# Patient Record
Sex: Male | Born: 1950 | Race: White | Hispanic: No | Marital: Married | Smoking: Never smoker
Health system: Southern US, Community
[De-identification: ages and names within clinical notes are randomized; demographics above are authoritative.]

## PROBLEM LIST (undated history)

## (undated) DIAGNOSIS — Z8739 Personal history of other diseases of the musculoskeletal system and connective tissue: Secondary | ICD-10-CM

## (undated) DIAGNOSIS — M48062 Spinal stenosis, lumbar region with neurogenic claudication: Secondary | ICD-10-CM

## (undated) DIAGNOSIS — E785 Hyperlipidemia, unspecified: Secondary | ICD-10-CM

## (undated) DIAGNOSIS — N281 Cyst of kidney, acquired: Secondary | ICD-10-CM

## (undated) DIAGNOSIS — R911 Solitary pulmonary nodule: Secondary | ICD-10-CM

## (undated) DIAGNOSIS — Z7982 Long term (current) use of aspirin: Secondary | ICD-10-CM

## (undated) DIAGNOSIS — D126 Benign neoplasm of colon, unspecified: Secondary | ICD-10-CM

## (undated) DIAGNOSIS — J45909 Unspecified asthma, uncomplicated: Secondary | ICD-10-CM

## (undated) DIAGNOSIS — M51369 Other intervertebral disc degeneration, lumbar region without mention of lumbar back pain or lower extremity pain: Secondary | ICD-10-CM

## (undated) DIAGNOSIS — M1990 Unspecified osteoarthritis, unspecified site: Secondary | ICD-10-CM

## (undated) DIAGNOSIS — E291 Testicular hypofunction: Secondary | ICD-10-CM

## (undated) DIAGNOSIS — R7303 Prediabetes: Secondary | ICD-10-CM

## (undated) DIAGNOSIS — Z8489 Family history of other specified conditions: Secondary | ICD-10-CM

## (undated) DIAGNOSIS — I10 Essential (primary) hypertension: Secondary | ICD-10-CM

## (undated) DIAGNOSIS — Z87891 Personal history of nicotine dependence: Secondary | ICD-10-CM

## (undated) DIAGNOSIS — N529 Male erectile dysfunction, unspecified: Secondary | ICD-10-CM

## (undated) DIAGNOSIS — R112 Nausea with vomiting, unspecified: Secondary | ICD-10-CM

## (undated) DIAGNOSIS — I2510 Atherosclerotic heart disease of native coronary artery without angina pectoris: Secondary | ICD-10-CM

## (undated) DIAGNOSIS — Z9841 Cataract extraction status, right eye: Secondary | ICD-10-CM

## (undated) DIAGNOSIS — L570 Actinic keratosis: Secondary | ICD-10-CM

## (undated) DIAGNOSIS — K621 Rectal polyp: Secondary | ICD-10-CM

## (undated) DIAGNOSIS — Z9889 Other specified postprocedural states: Secondary | ICD-10-CM

## (undated) DIAGNOSIS — K635 Polyp of colon: Secondary | ICD-10-CM

## (undated) HISTORY — DX: Actinic keratosis: L57.0

## (undated) HISTORY — PX: OTHER SURGICAL HISTORY: SHX169

## (undated) HISTORY — PX: EYE SURGERY: SHX253

---

## 1970-10-25 HISTORY — PX: HERNIA REPAIR: SHX51

## 1974-10-25 HISTORY — PX: ANKLE FRACTURE SURGERY: SHX122

## 1974-10-25 HISTORY — PX: FRACTURE SURGERY: SHX138

## 1982-10-25 HISTORY — PX: BACK SURGERY: SHX140

## 2005-12-17 ENCOUNTER — Ambulatory Visit: Payer: Self-pay | Admitting: Gastroenterology

## 2007-09-22 ENCOUNTER — Ambulatory Visit: Payer: Self-pay | Admitting: Internal Medicine

## 2007-10-24 ENCOUNTER — Ambulatory Visit: Payer: Self-pay | Admitting: Internal Medicine

## 2007-10-24 IMAGING — CT CT ABDOMEN W/ CM
1 of 3 series · 14 of 32 positions shown, 19 images · non-contrast
Comparison: none

REASON FOR EXAM: RUQ pain
COMMENTS:

PROCEDURE:     CT  - CT ABDOMEN STANDARD W  - [DATE] [DATE]
RESULT:
HISTORY: Hepatomegaly, RIGHT upper quadrant/RIGHT flank pain.

[Series 2: soft tissue · axial · 0.85mm/px · z∈[-938,-674]mm · 14 of 39 slices shown, 19 images]
[im 3/39  soft-tissue]
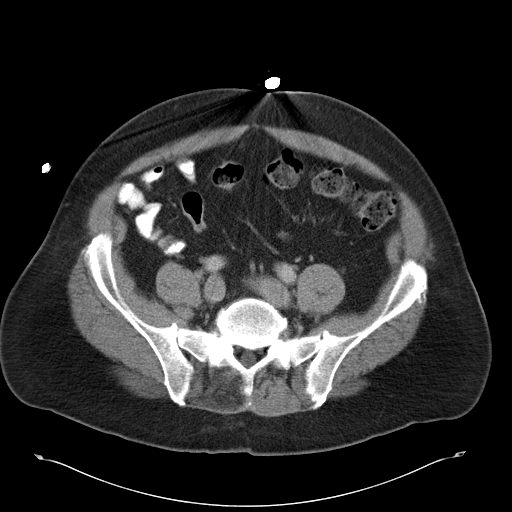
[im 3/39  bone]
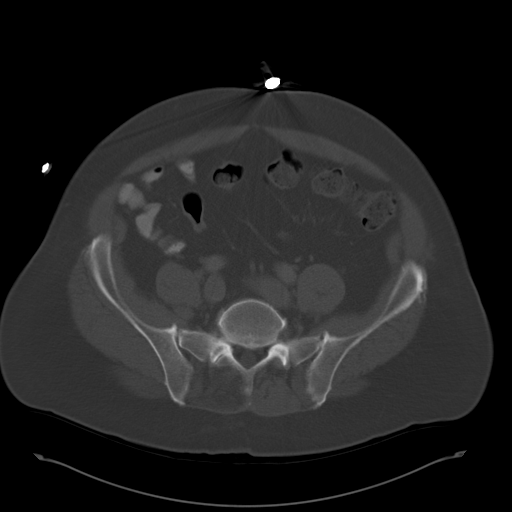
[im 5/39  soft-tissue]
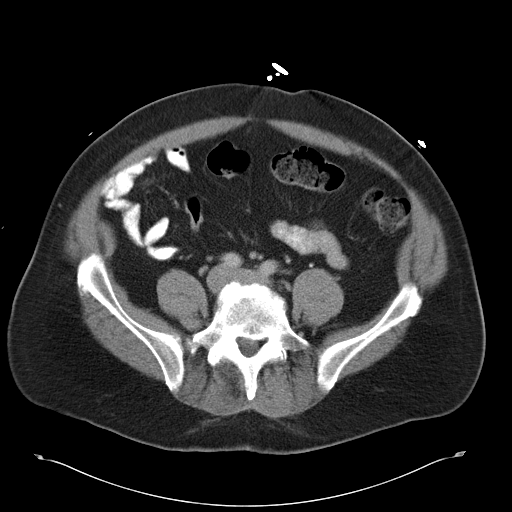
[im 8/39  soft-tissue]
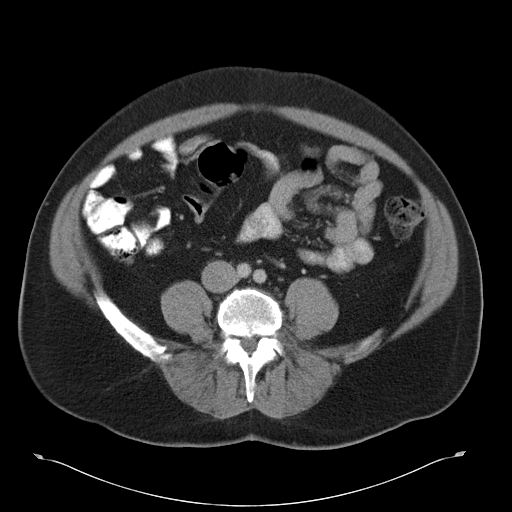
[im 12/39  soft-tissue]
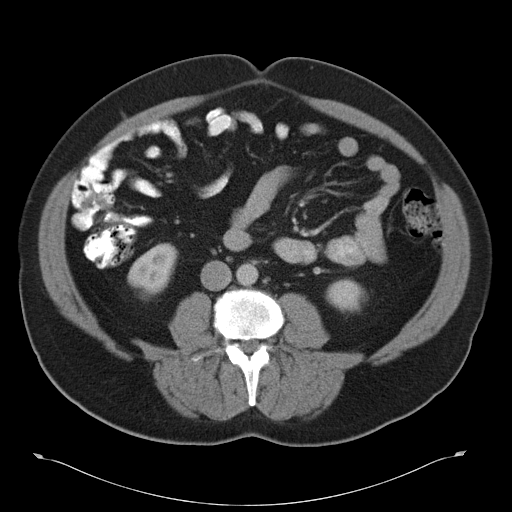
[im 15/39  soft-tissue]
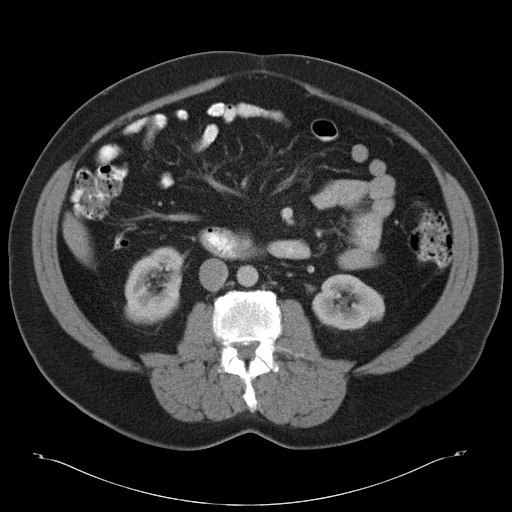
[im 17/39  soft-tissue]
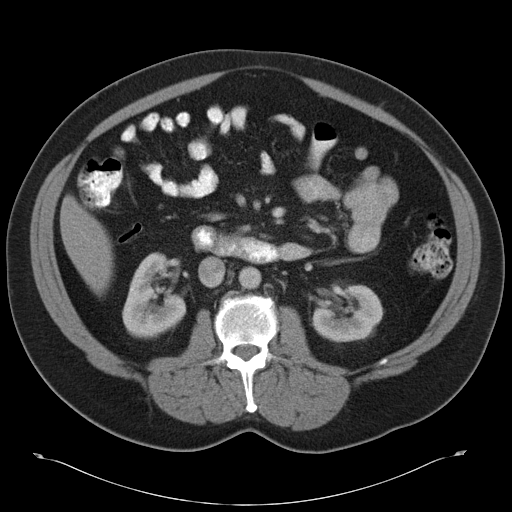
[im 20/39  soft-tissue]
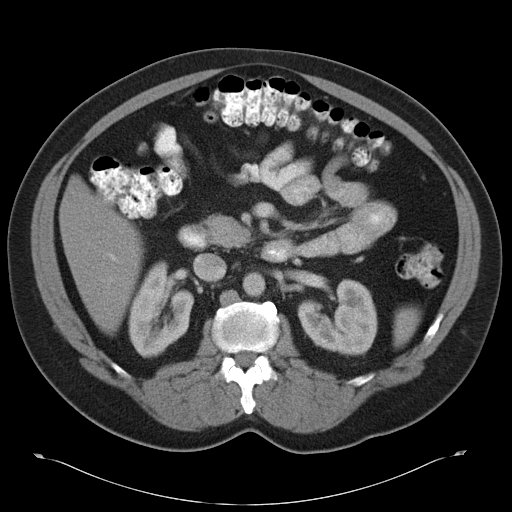
[im 22/39  soft-tissue]
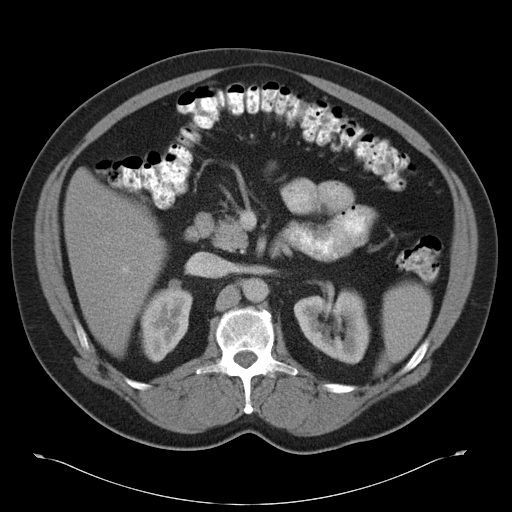
[im 24/39  soft-tissue]
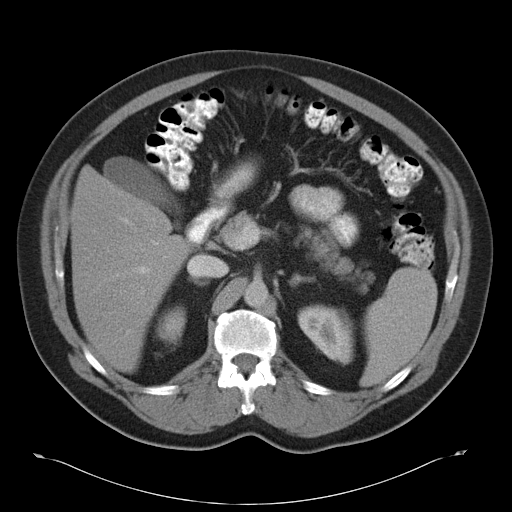
[im 24/39  bone]
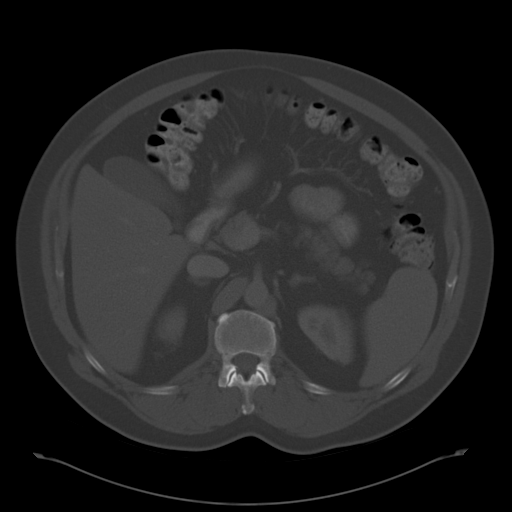
[im 27/39  soft-tissue]
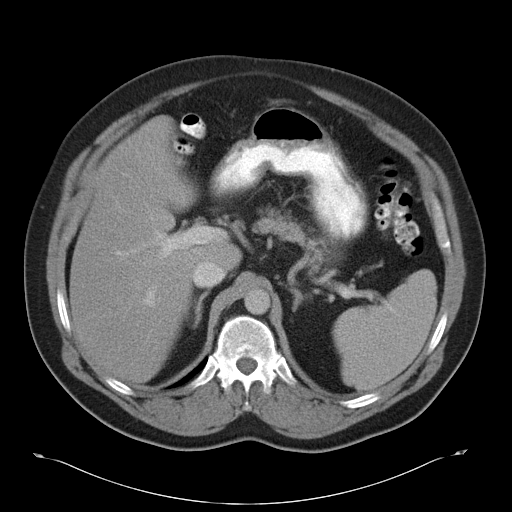
[im 29/39  lung]
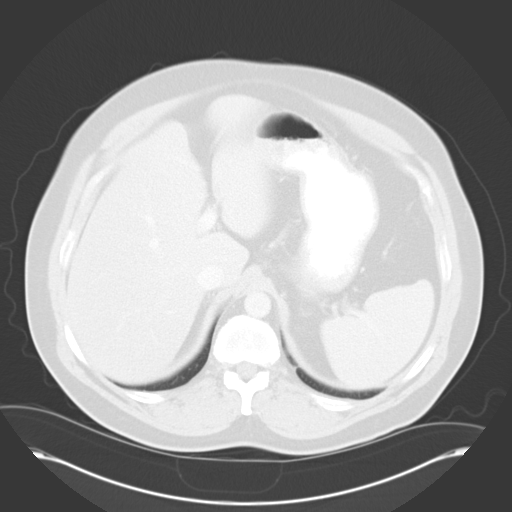
[im 31/39  soft-tissue]
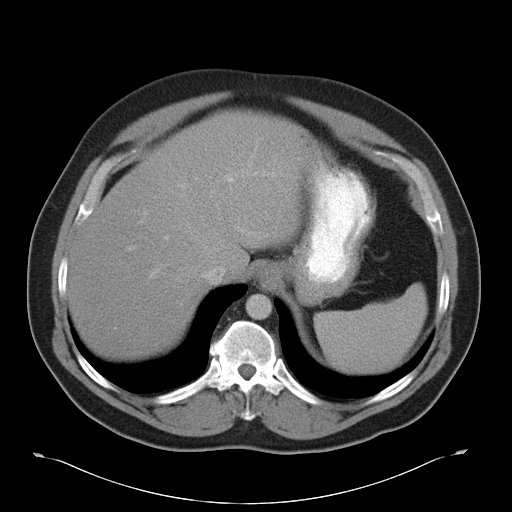
[im 31/39  lung]
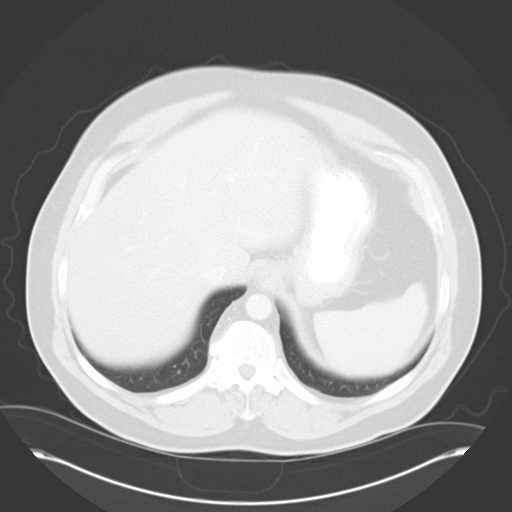
[im 34/39  soft-tissue]
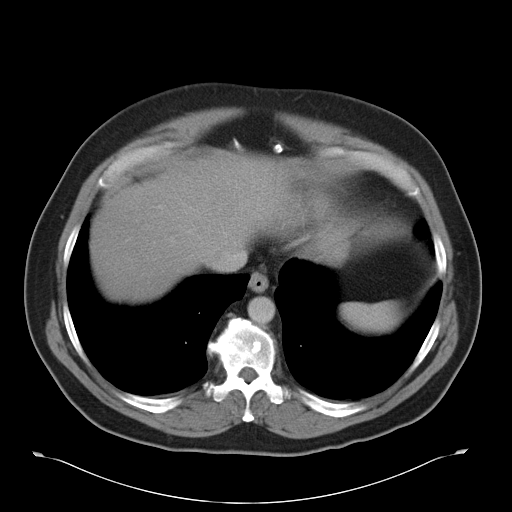
[im 34/39  lung]
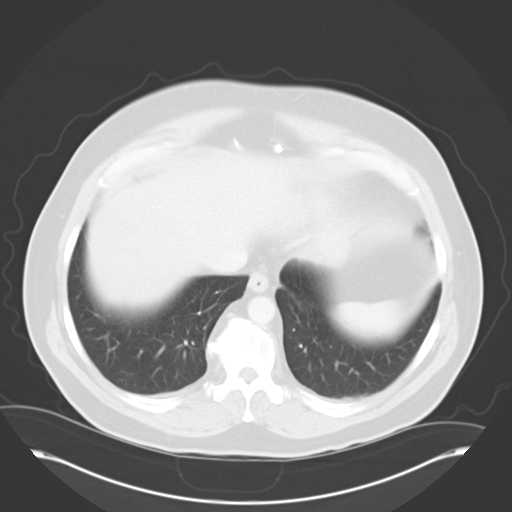
[im 36/39  soft-tissue]
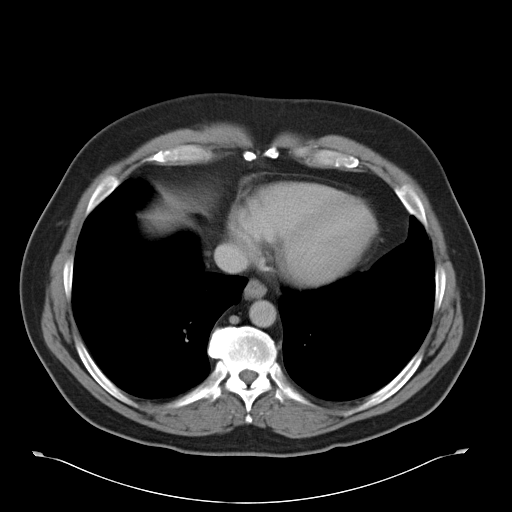
[im 36/39  lung]
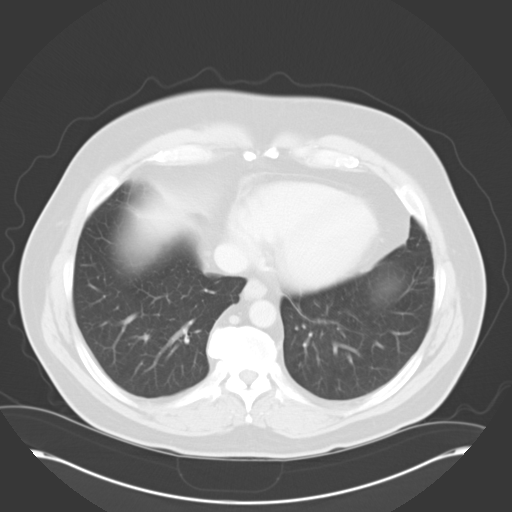

[14 of 32 positions shown; findings below may reference images not displayed]

COMPARISON STUDIES: Ultrasound of the abdomen dated [DATE].

PROCEDURE AND FINDINGS: The liver and spleen are normal. The hepatic veins
and portal vein are normal. The gallbladder is non-distended.  The pancreas
is normal. The adrenals are normal.  No bowel distention or focal bowel
abnormalities are identified. No hydronephrosis or significant renal
abnormalities are identified. A questionable nodular density is noted in the
anterior aspect of the RIGHT kidney. Ultrasound of this kidney is suggested
to exclude a focal renal lesion. This could represent volume averaging with
adjacent renal vein. The small density measures approximately 9 mm. The
retroperitoneum including abdominal aorta is unremarkable. The appendix is
normal.
IMPRESSION: 1.No acute abnormality identified.

2.Small 9 mm lesion present anterior to the RIGHT kidney. This could just
represent volume averaging with adjacent renal vein. To exclude a focal
renal lesion ultrasound of the kidney is suggested for further evaluation.

## 2007-11-10 ENCOUNTER — Ambulatory Visit: Payer: Self-pay | Admitting: Internal Medicine

## 2007-11-10 IMAGING — US US RENAL KIDNEY
1 series · 17 of 25 positions shown · non-contrast
Comparison: none

REASON FOR EXAM: Ct showed possible nodule
COMMENTS:

PROCEDURE:     US  - US KIDNEY  - [DATE]  [DATE]
RESULT:     Comparison: Abdomen pelvis CT on [DATE]

[Series 1: us renal kidney · 17 of 32 slices shown]
[im 1/32]
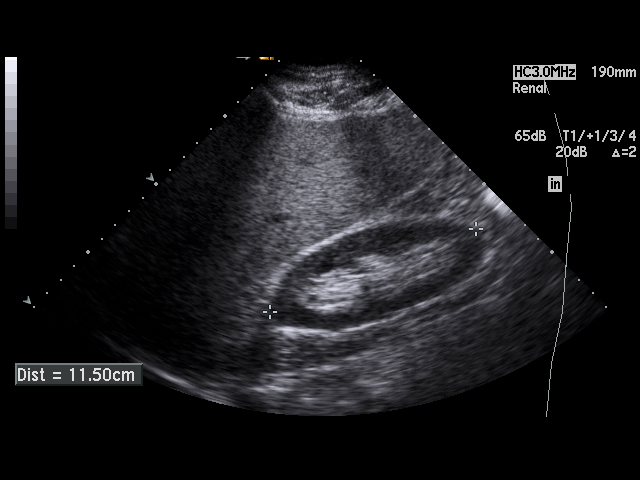
[im 3/32]
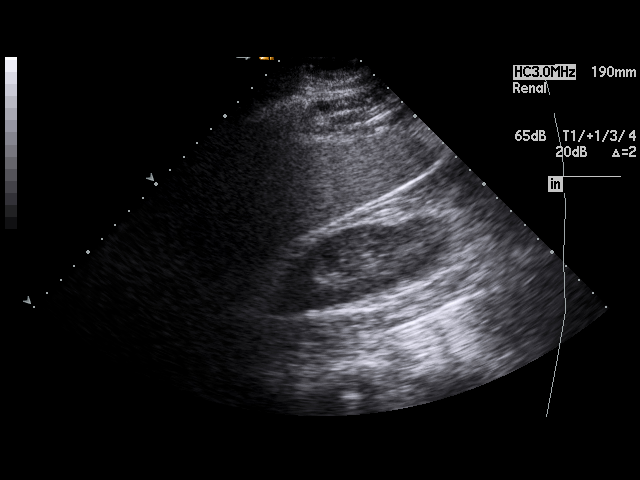
[im 4/32]
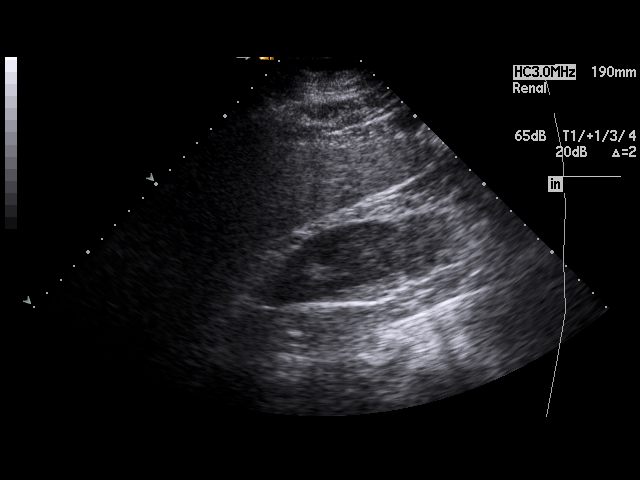
[im 7/32]
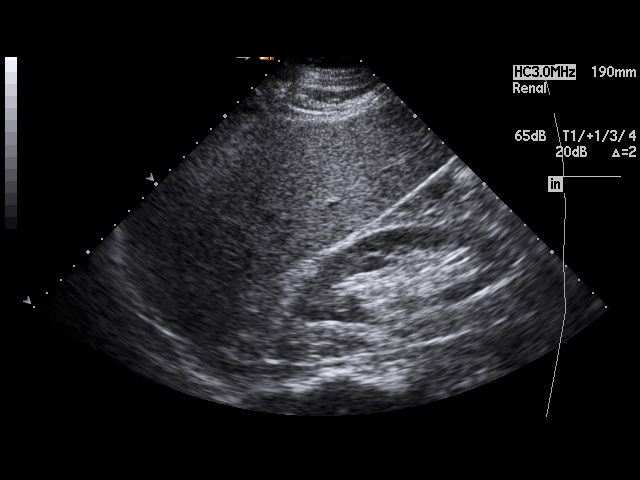
[im 8/32]
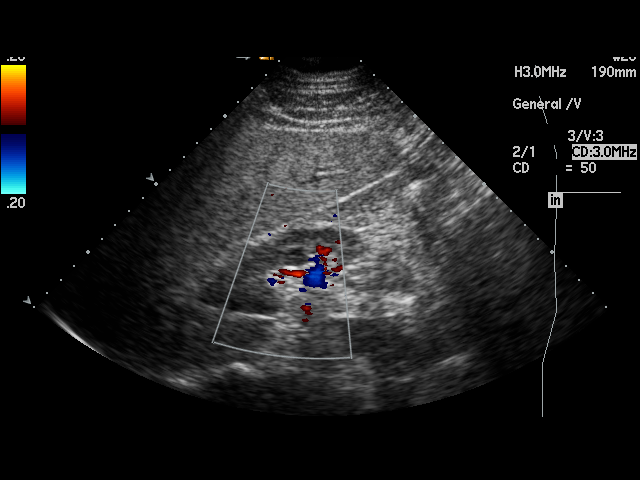
[im 11/32]
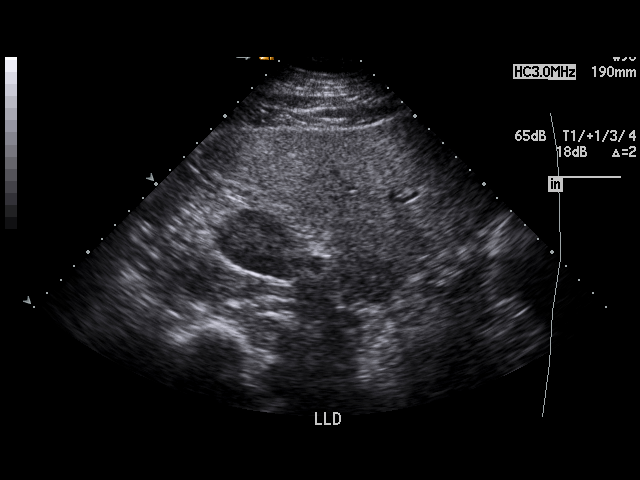
[im 12/32]
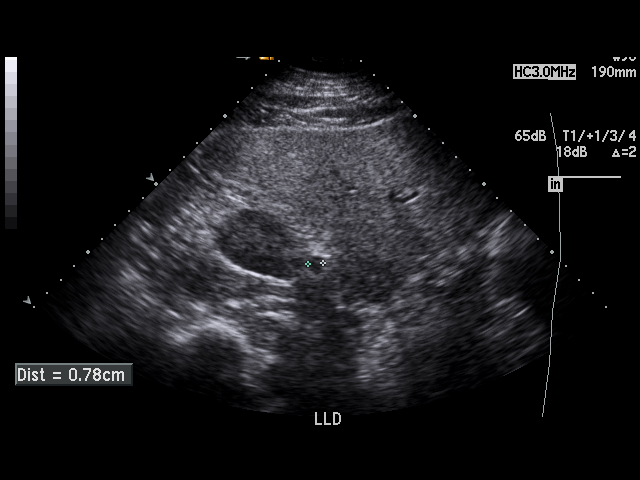
[im 15/32]
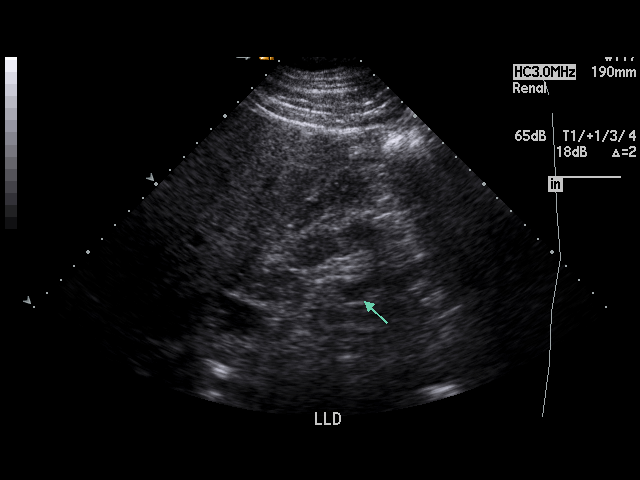
[im 16/32]
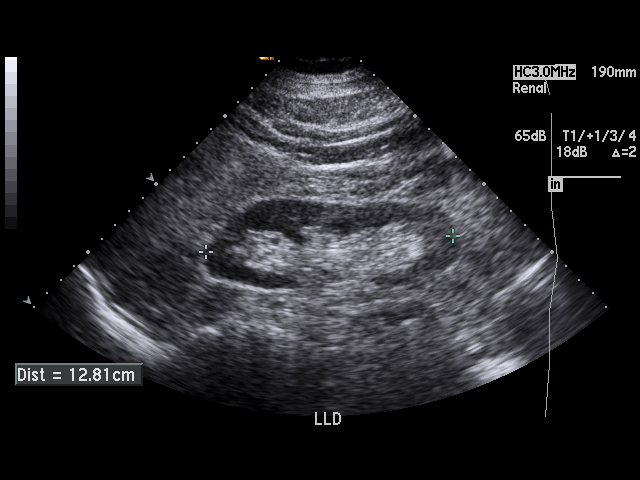
[im 17/32]
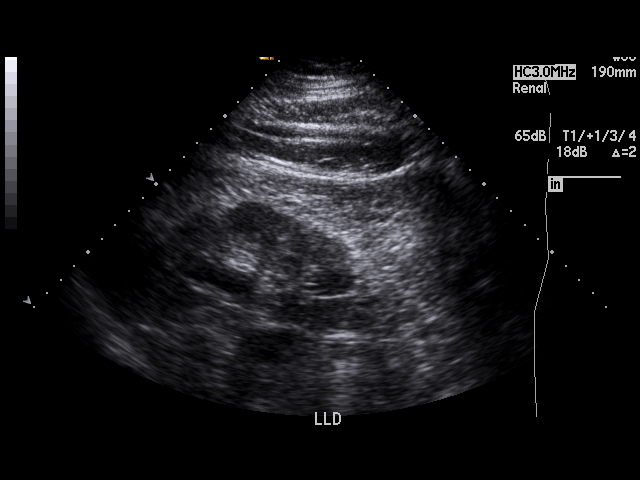
[im 20/32]
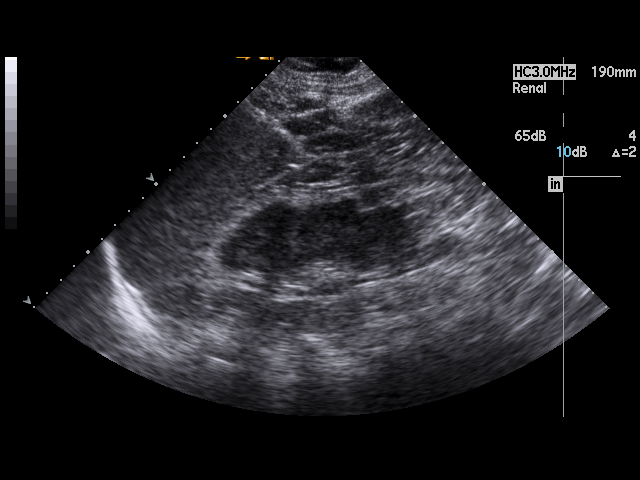
[im 21/32]
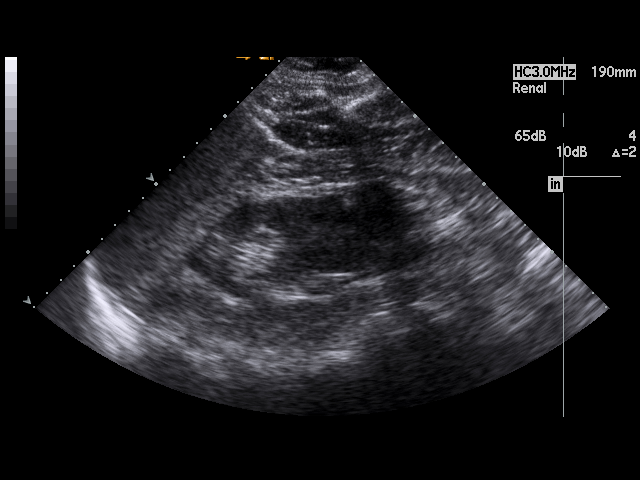
[im 24/32]
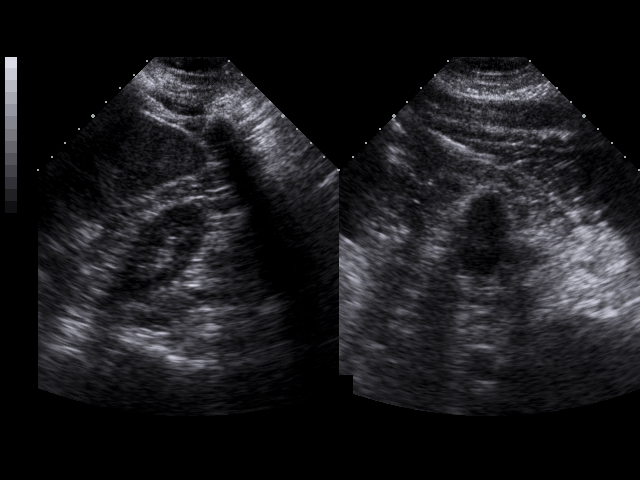
[im 25/32]
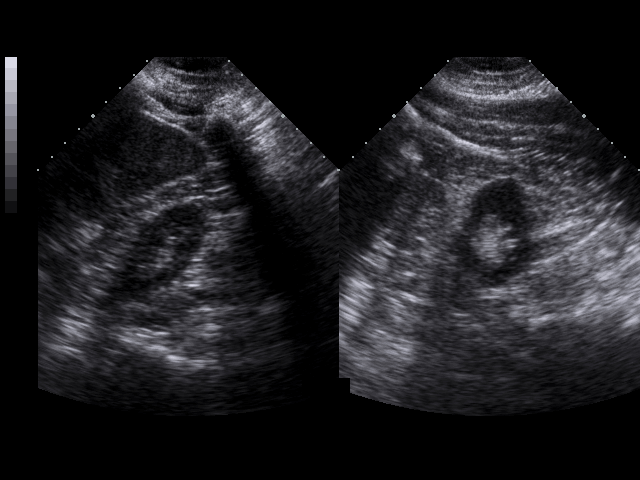
[im 28/32]
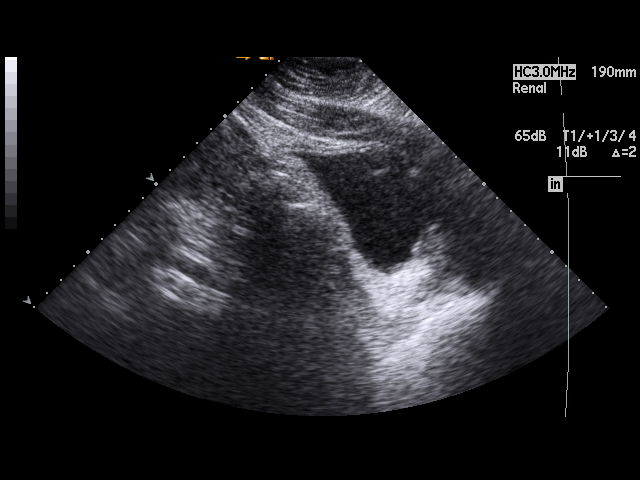
[im 29/32]
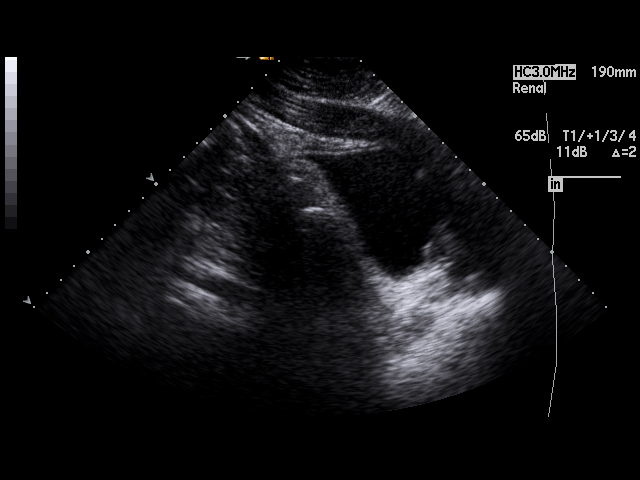
[im 32/32]
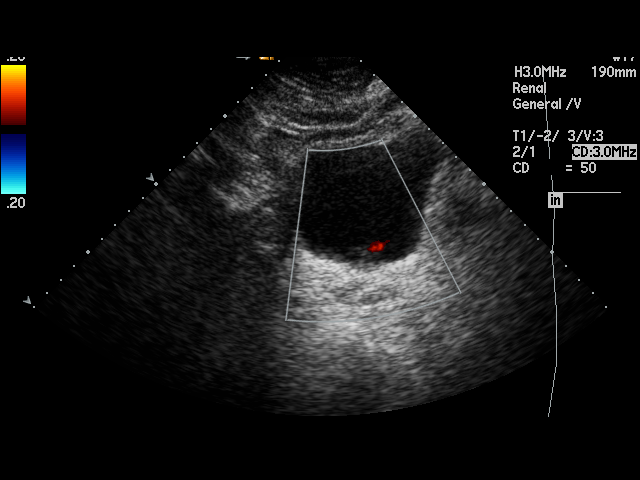

[17 of 25 positions shown; findings below may reference images not displayed]

FINDINGS: The right kidney measures 12.8 cm and the left measures 13.5 cm in length.
There is normal corticomedullary differentiation. 1.0 x 0.9 x 0.8 cm
hypoechoic exophytic lesion is seen involving the right upper pole. It is
too small to fully characterize. No definite stone is seen. There is no
hydronephrosis.

The bladder is partially filled and is grossly normal.
IMPRESSION: 1. 1.0 x 0.9 x 0.8 cm hypoechoic exophytic lesion is seen involving the
right upper pole. It is too small to fully characterize. It can be followed
with a renal mass protocol CT to assess for any solid enhancing component.

## 2008-02-23 DIAGNOSIS — C4491 Basal cell carcinoma of skin, unspecified: Secondary | ICD-10-CM

## 2008-02-23 DIAGNOSIS — C44619 Basal cell carcinoma of skin of left upper limb, including shoulder: Secondary | ICD-10-CM

## 2008-02-23 HISTORY — DX: Basal cell carcinoma of skin, unspecified: C44.91

## 2008-02-23 HISTORY — DX: Basal cell carcinoma of skin of left upper limb, including shoulder: C44.619

## 2008-08-02 ENCOUNTER — Ambulatory Visit: Payer: Self-pay | Admitting: Internal Medicine

## 2008-08-02 IMAGING — CT CT ABDOMEN WO/W CM
2 of 4 series · 14 of 32 positions shown, 19 images · non-contrast
Comparison: none

REASON FOR EXAM: right renal mass
COMMENTS:

[Series 2: soft tissue w/o · axial · non-contrast · 0.83mm/px · z∈[+181,+412]mm · 8 of 99 slices shown, 13 images]
[im 11/99  soft-tissue]
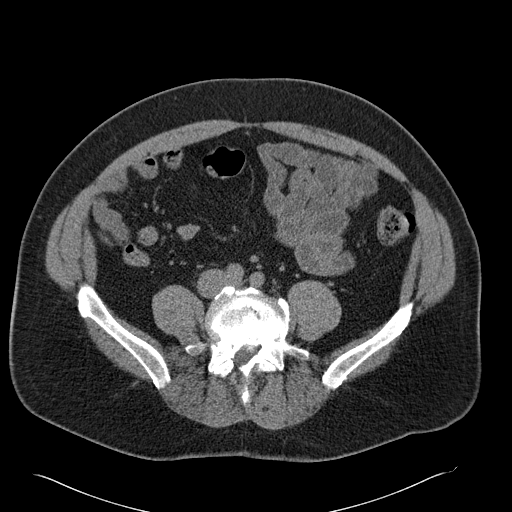
[im 11/99  bone]
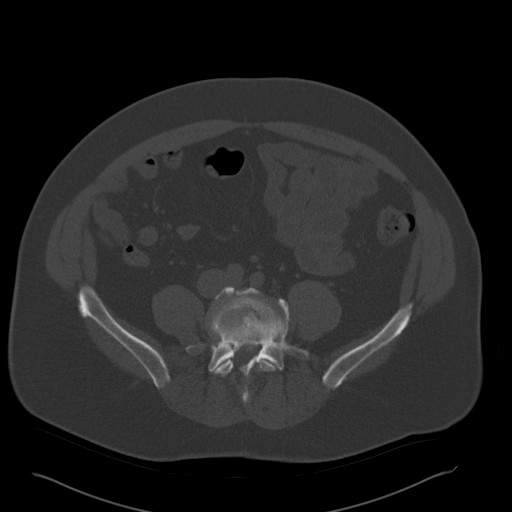
[im 22/99  soft-tissue]
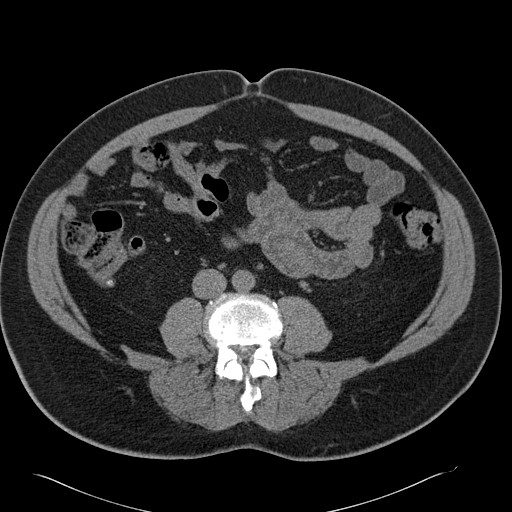
[im 33/99  soft-tissue]
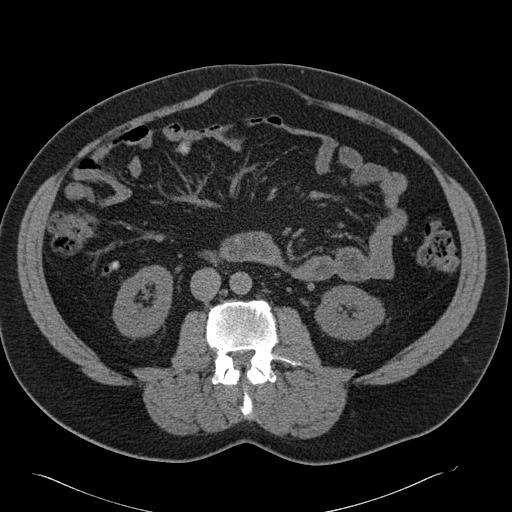
[im 44/99  soft-tissue]
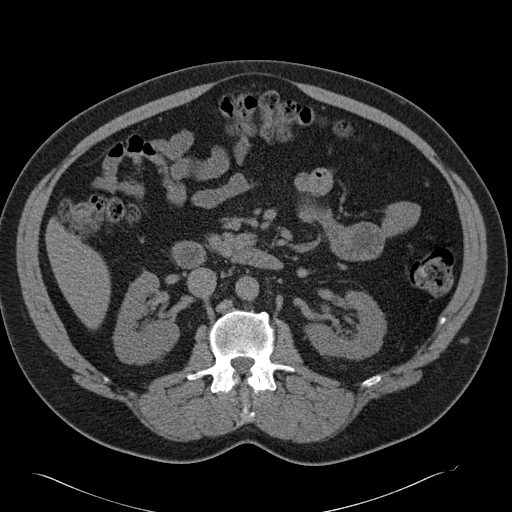
[im 55/99  soft-tissue]
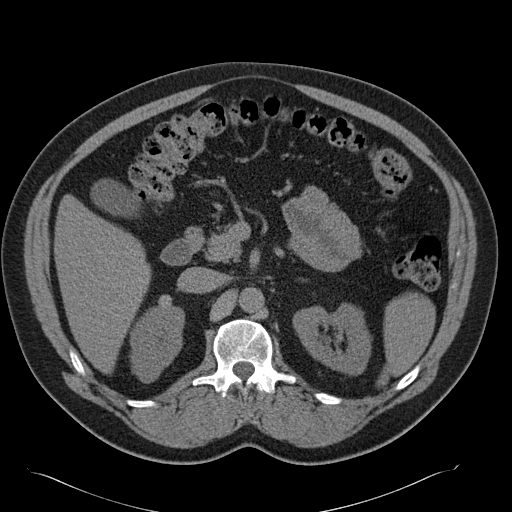
[im 55/99  lung]
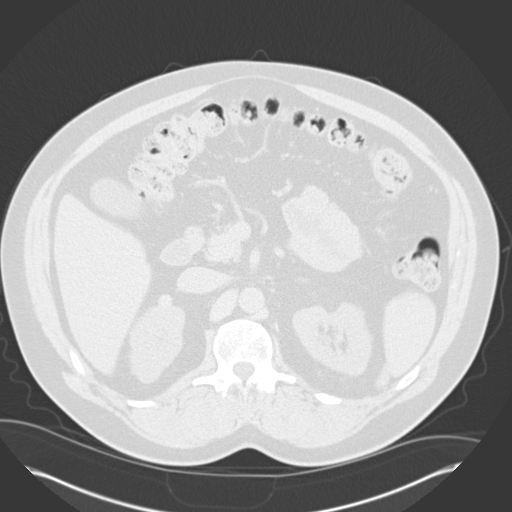
[im 66/99  soft-tissue]
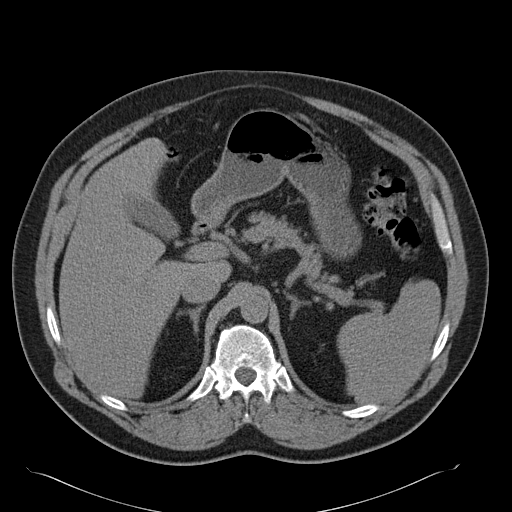
[im 66/99  lung]
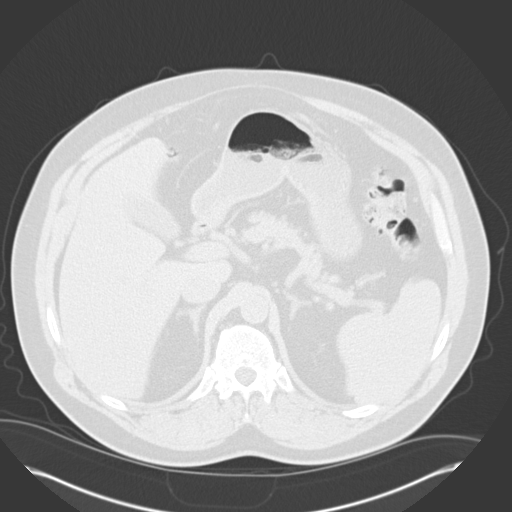
[im 77/99  soft-tissue]
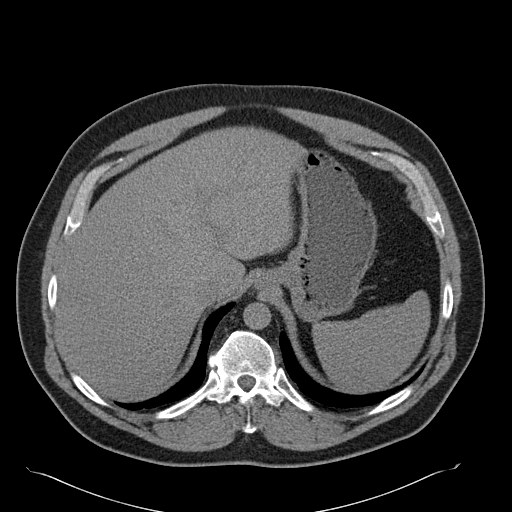
[im 77/99  lung]
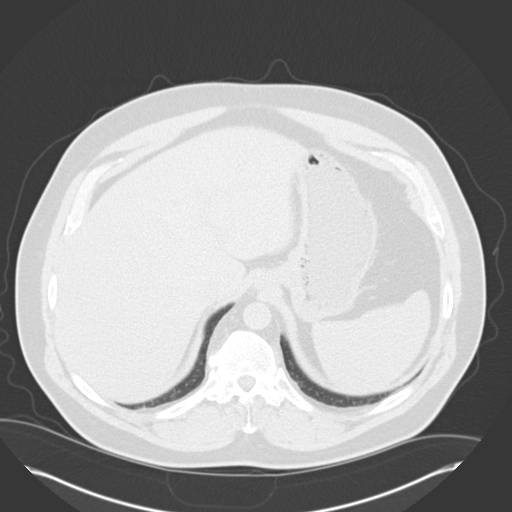
[im 88/99  soft-tissue]
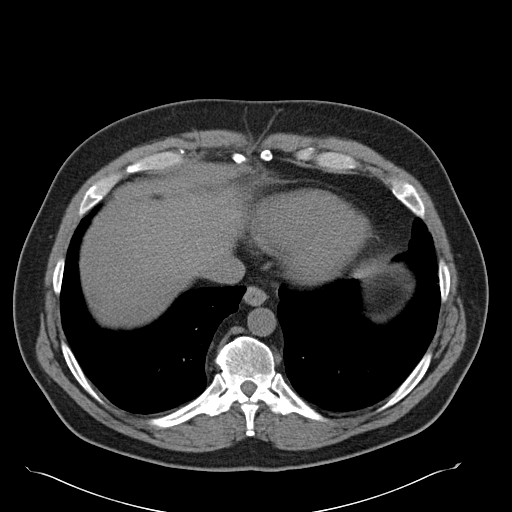
[im 88/99  lung]
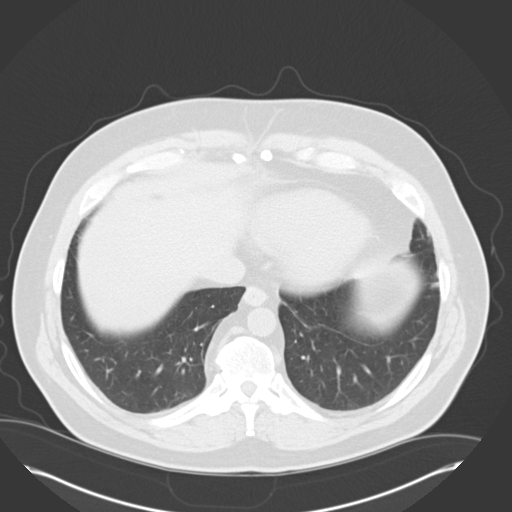

[Series 4: soft tissue with · axial · 0.83mm/px · z∈[+187,+370]mm · 6 of 99 slices shown]
[im 13/99  soft-tissue]
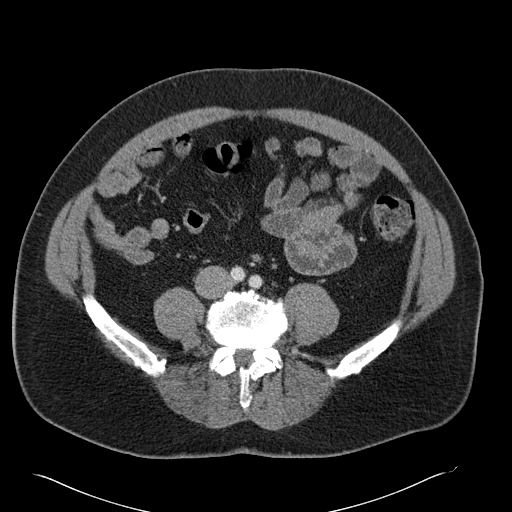
[im 25/99  soft-tissue]
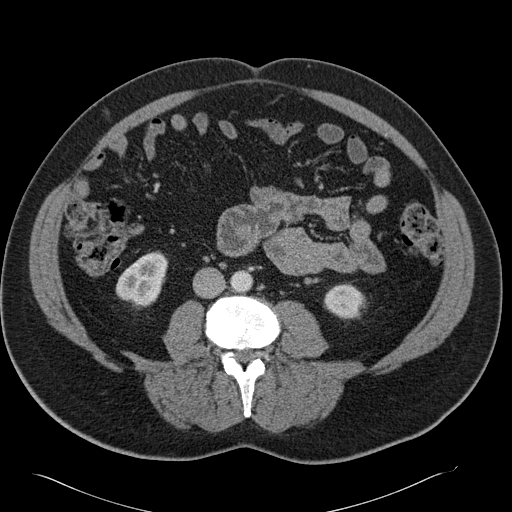
[im 37/99  soft-tissue]
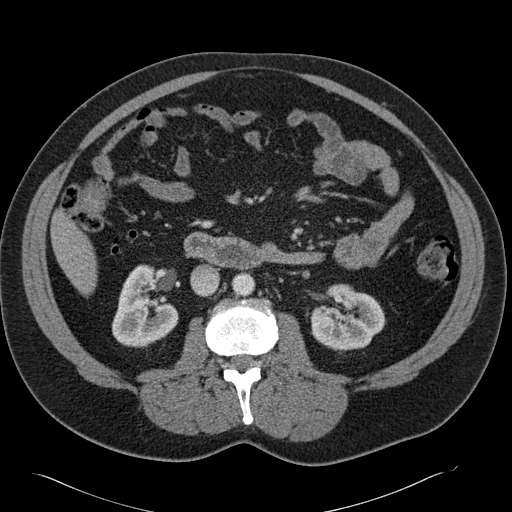
[im 50/99  soft-tissue]
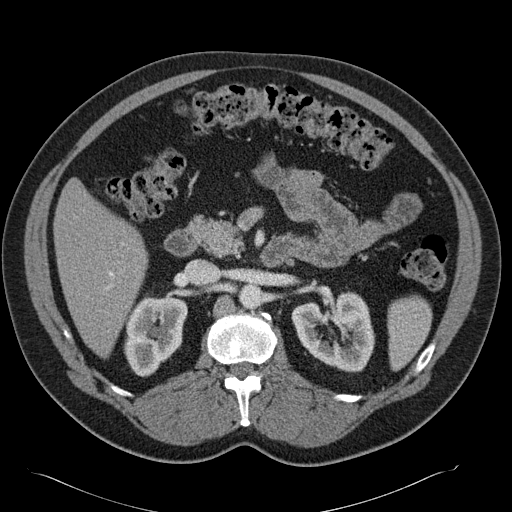
[im 62/99  soft-tissue]
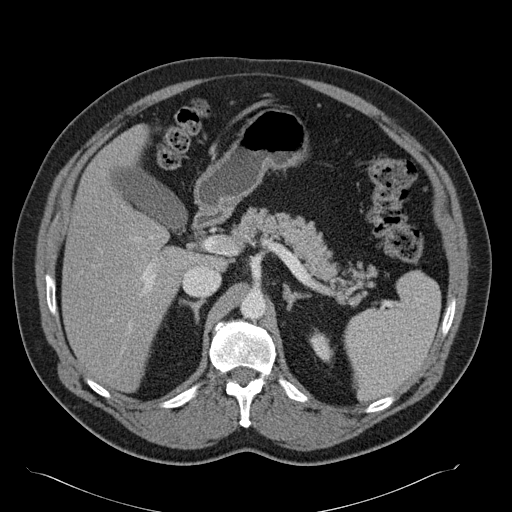
[im 74/99  soft-tissue]
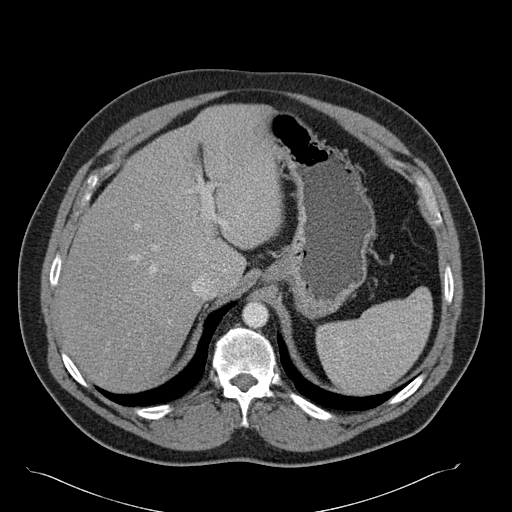

[14 of 32 positions shown; findings below may reference images not displayed]

PROCEDURE:     CT  - CT ABDOMEN STANDARD W/WO  - [DATE]  [DATE]

RESULT:     Comparison is made to a study [DATE]. For today's exam
the patient underwent a triphasic study. For the postcontrast images the
patient received 85 ml of [SG].

On the noncontrast images there is an exophytic hyperdense lesion associated
with the upper pole of the RIGHT kidney anteriorly most compatible with a
hyperdense cyst. It measures 10 mm in diameter. It has Hounsfield
measurement of 35 precontrast, 54 postcontrast, and 43 on delayed images. It
has Hounsfield measurements of approximately 32 on a delayed image from [DATE]. Neither kidney exhibits evidence of calcified stones nor of
obstruction. I see no evidence of parenchymal masses involving the LEFT
kidney.

The liver, gallbladder, partially distended stomach, pancreas, and spleen
exhibit no acute abnormalities. There are no adrenal masses. The caliber of
the abdominal aorta is normal. No bowel abnormality is identified. The exam
was not carried into the pelvis. There are mild degenerative changes noted
of the lumbar spine
IMPRESSION: 1. There is a hyperdense focus involving the anterior aspect of the upper
pole of the RIGHT kidney. Hounsfield measurements are indeterminate. The
structure does not appear to have changed significantly in size and measures
no more than 9 to 10 mm in diameter.
2. I do not see abnormalities elsewhere involving the kidneys.
3. No acute abnormality elsewhere within the abdomen is identified.

## 2009-07-16 ENCOUNTER — Emergency Department: Payer: Self-pay | Admitting: Emergency Medicine

## 2009-07-29 ENCOUNTER — Ambulatory Visit: Payer: Self-pay | Admitting: Internal Medicine

## 2009-07-29 IMAGING — CT CT ABDOMEN WO/W CM
2 of 4 series · 10 of 32 positions shown, 15 images · non-contrast
Comparison: none

REASON FOR EXAM: right renal mass
COMMENTS:

[Series 6: soft tissue delay · axial · delayed · 0.83mm/px · z∈[-760,-544]mm · 8 of 94 slices shown, 13 images]
[im 11/94  soft-tissue]
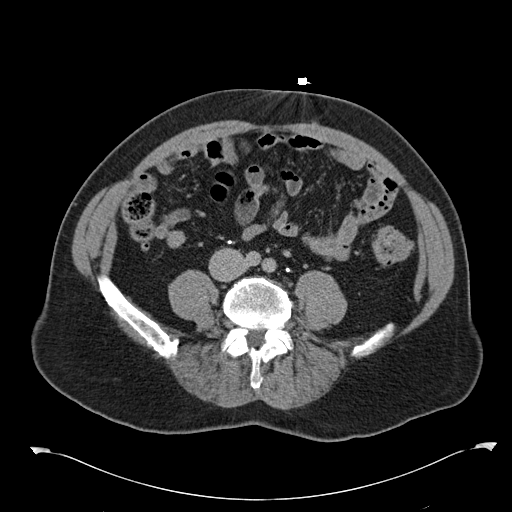
[im 11/94  bone]
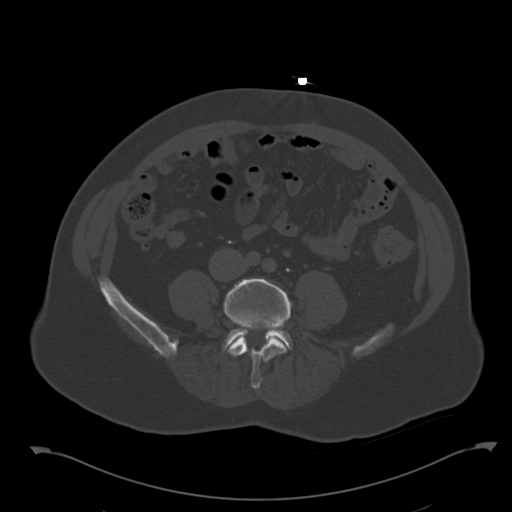
[im 21/94  soft-tissue]
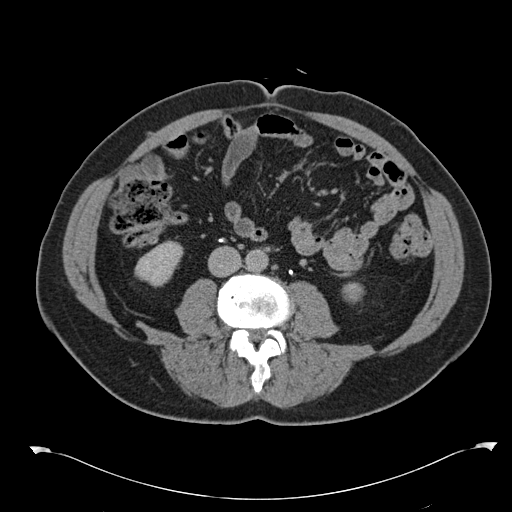
[im 32/94  soft-tissue]
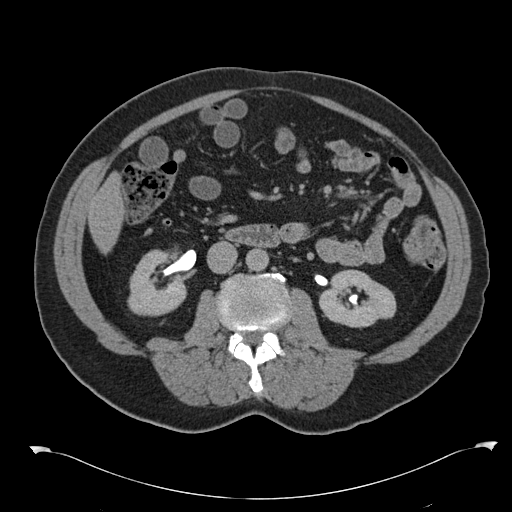
[im 42/94  soft-tissue]
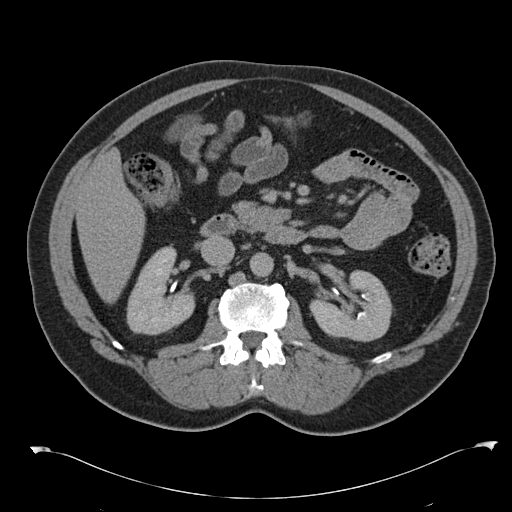
[im 52/94  soft-tissue]
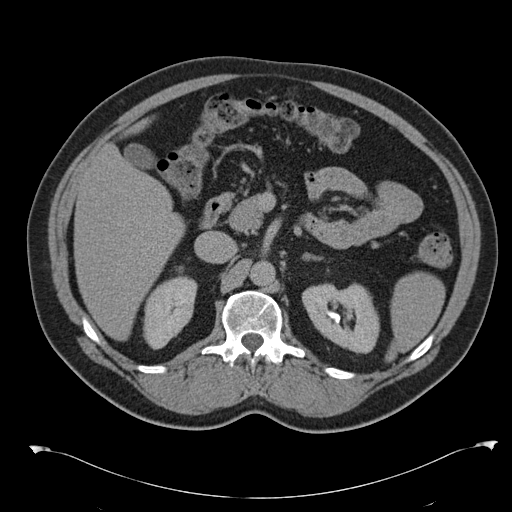
[im 52/94  lung]
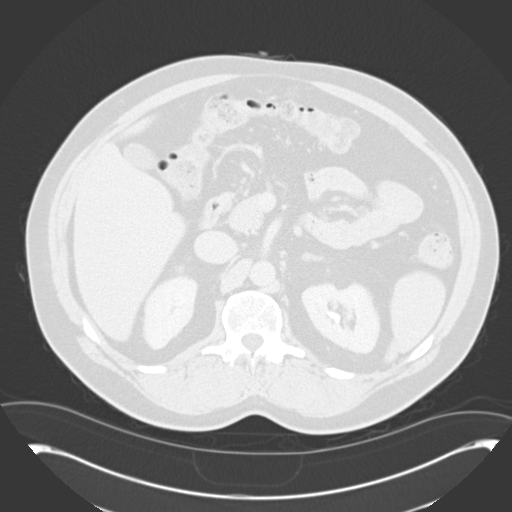
[im 63/94  soft-tissue]
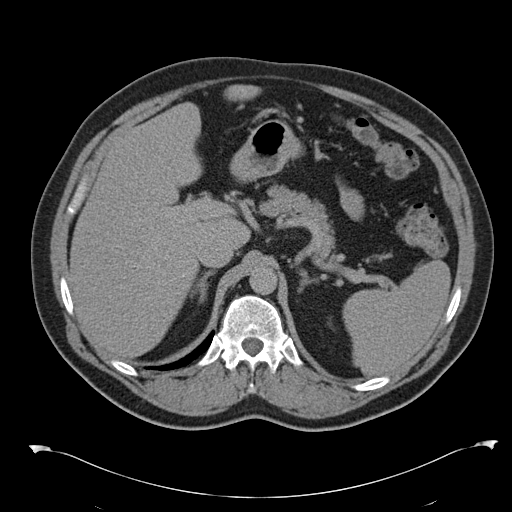
[im 63/94  lung]
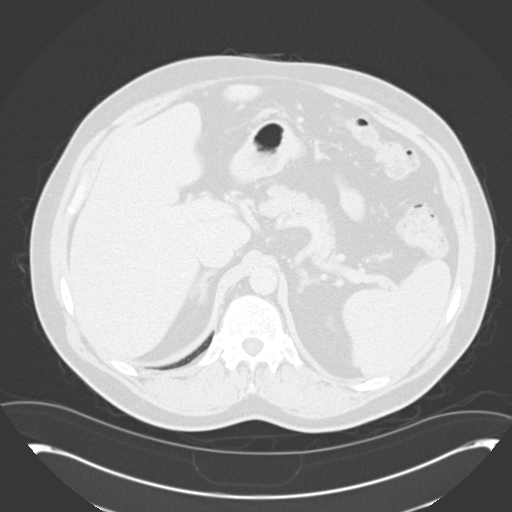
[im 73/94  soft-tissue]
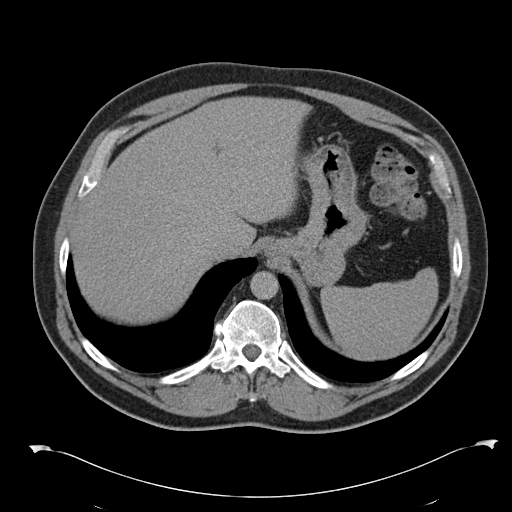
[im 73/94  lung]
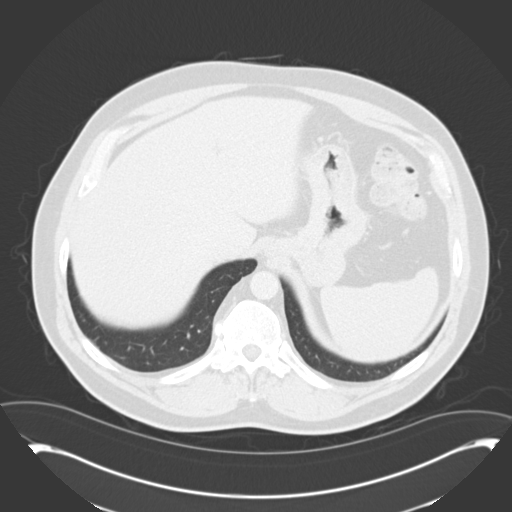
[im 83/94  soft-tissue]
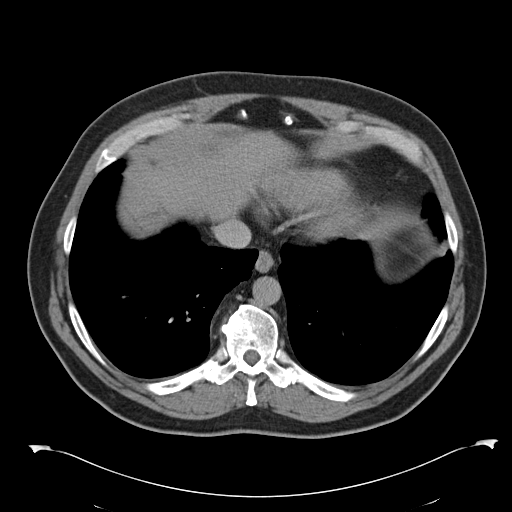
[im 83/94  lung]
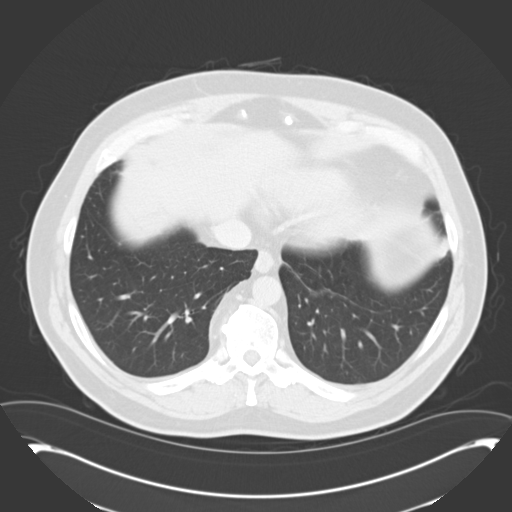

[Series 8: lung · axial · 0.83mm/px · z∈[-589,-550]mm · 2 of 39 slices shown]
[im 13/39  bone]
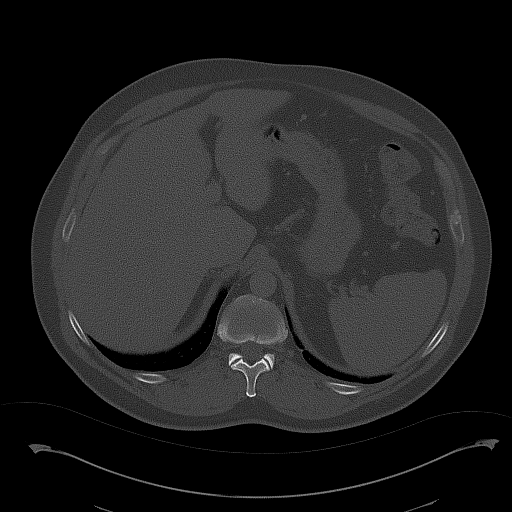
[im 26/39  bone]
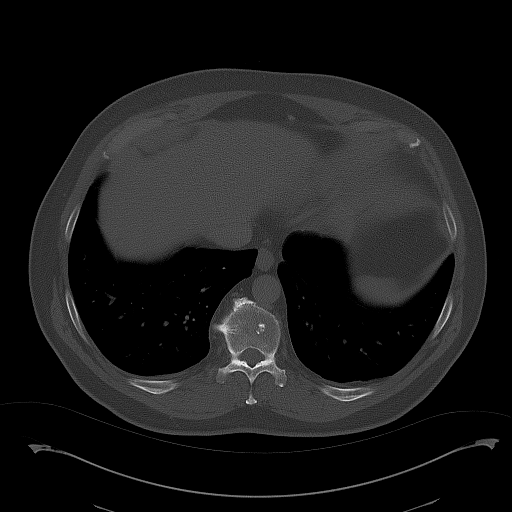

[10 of 32 positions shown; findings below may reference images not displayed]

PROCEDURE:     CT  - CT ABDOMEN STANDARD W/WO  - [DATE]  [DATE]

RESULT:     Comparison is made to a prior study dated [DATE].

Helical 3 mm sections were obtained from the lung bases through the superior
iliac crest pre, immediate and delay intravenous administration of 100 ml of
Isovue 370.

Evaluation of the lung bases demonstrates no gross abnormalities.

The previously described small exophytic hyperdense lesion along the
anterior upper pole region of the right kidney measures 1.05 cm in diameter.
This is unchanged in size when compared to the previous studies. Hounsfield
units of this area measure 48, 61 and 54, which when compared to the
previous study are relatively unchanged with technical variability taken
into consideration. No further renal masses, hydronephrosis nor calculi are
identified. The liver, spleen, adrenals and pancreas are unremarkable. There
is no CT evidence of bowel obstruction. There is no evidence of abdominal
free fluid, drainable loculated fluid collections, masses or adenopathy.
There is no CT evidence of an abdominal aortic aneurysm.
IMPRESSION: 1. Stable exophytic hyperdense lesion involving the right kidney likely
representing a hyperdense cyst. No further abnormalities are appreciated.

## 2010-10-12 ENCOUNTER — Ambulatory Visit: Payer: Self-pay | Admitting: General Practice

## 2010-10-25 HISTORY — PX: JOINT REPLACEMENT: SHX530

## 2010-10-25 HISTORY — PX: KNEE ARTHROPLASTY: SHX992

## 2010-10-28 ENCOUNTER — Inpatient Hospital Stay: Payer: Self-pay | Admitting: General Practice

## 2010-10-28 IMAGING — CR DG KNEE 1-2V*L*
1 series · 2 of 2 positions shown · non-contrast
Comparison: none

REASON FOR EXAM: postop
COMMENTS:   Bedside (portable):Y

PROCEDURE:     DXR - DXR KNEE LEFT AP AND LATERAL  - [DATE] [DATE]
RESULT:     Comparison: None
Fines:
Hardware demonstrated from left total knee arthroplasty. Surgical drain is
in place. Soft tissue gas likely postsurgical. There are skin staples. No
perihardware lucency.

[Series 1: view not recorded · 0.17mm/px · 2 of 2 slices shown]
[im 1/2]
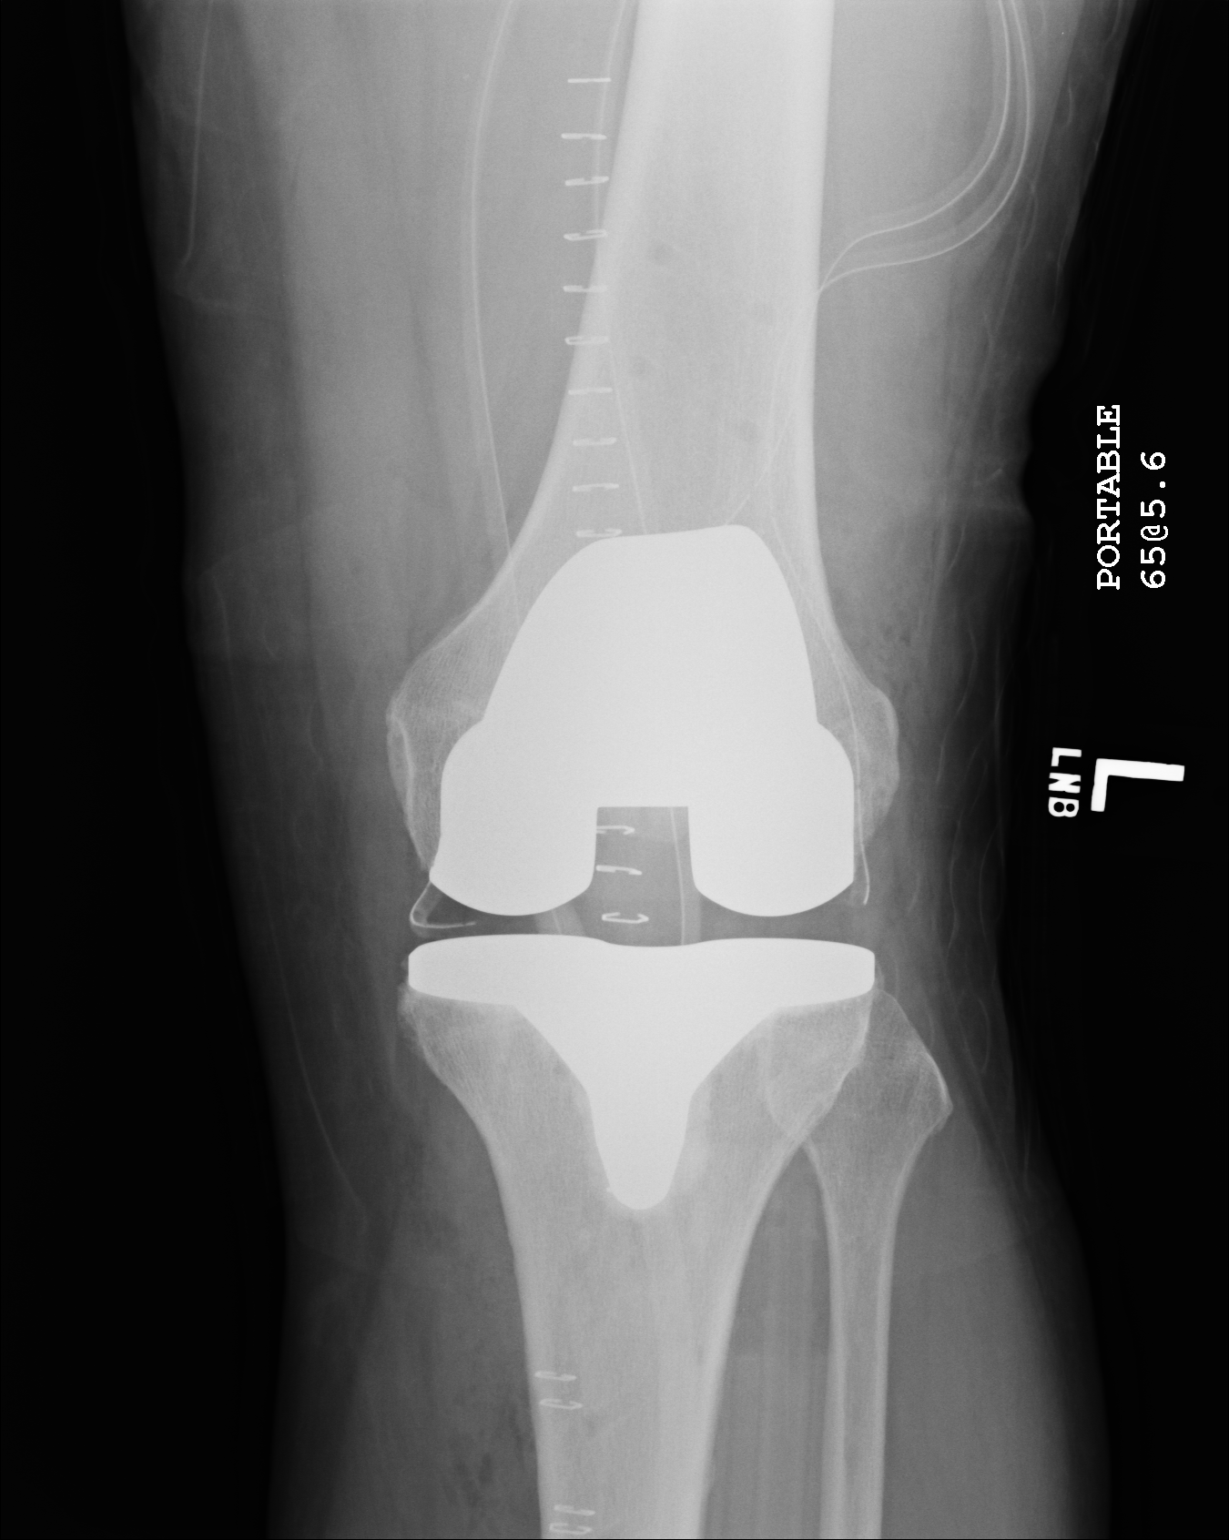
[im 2/2]
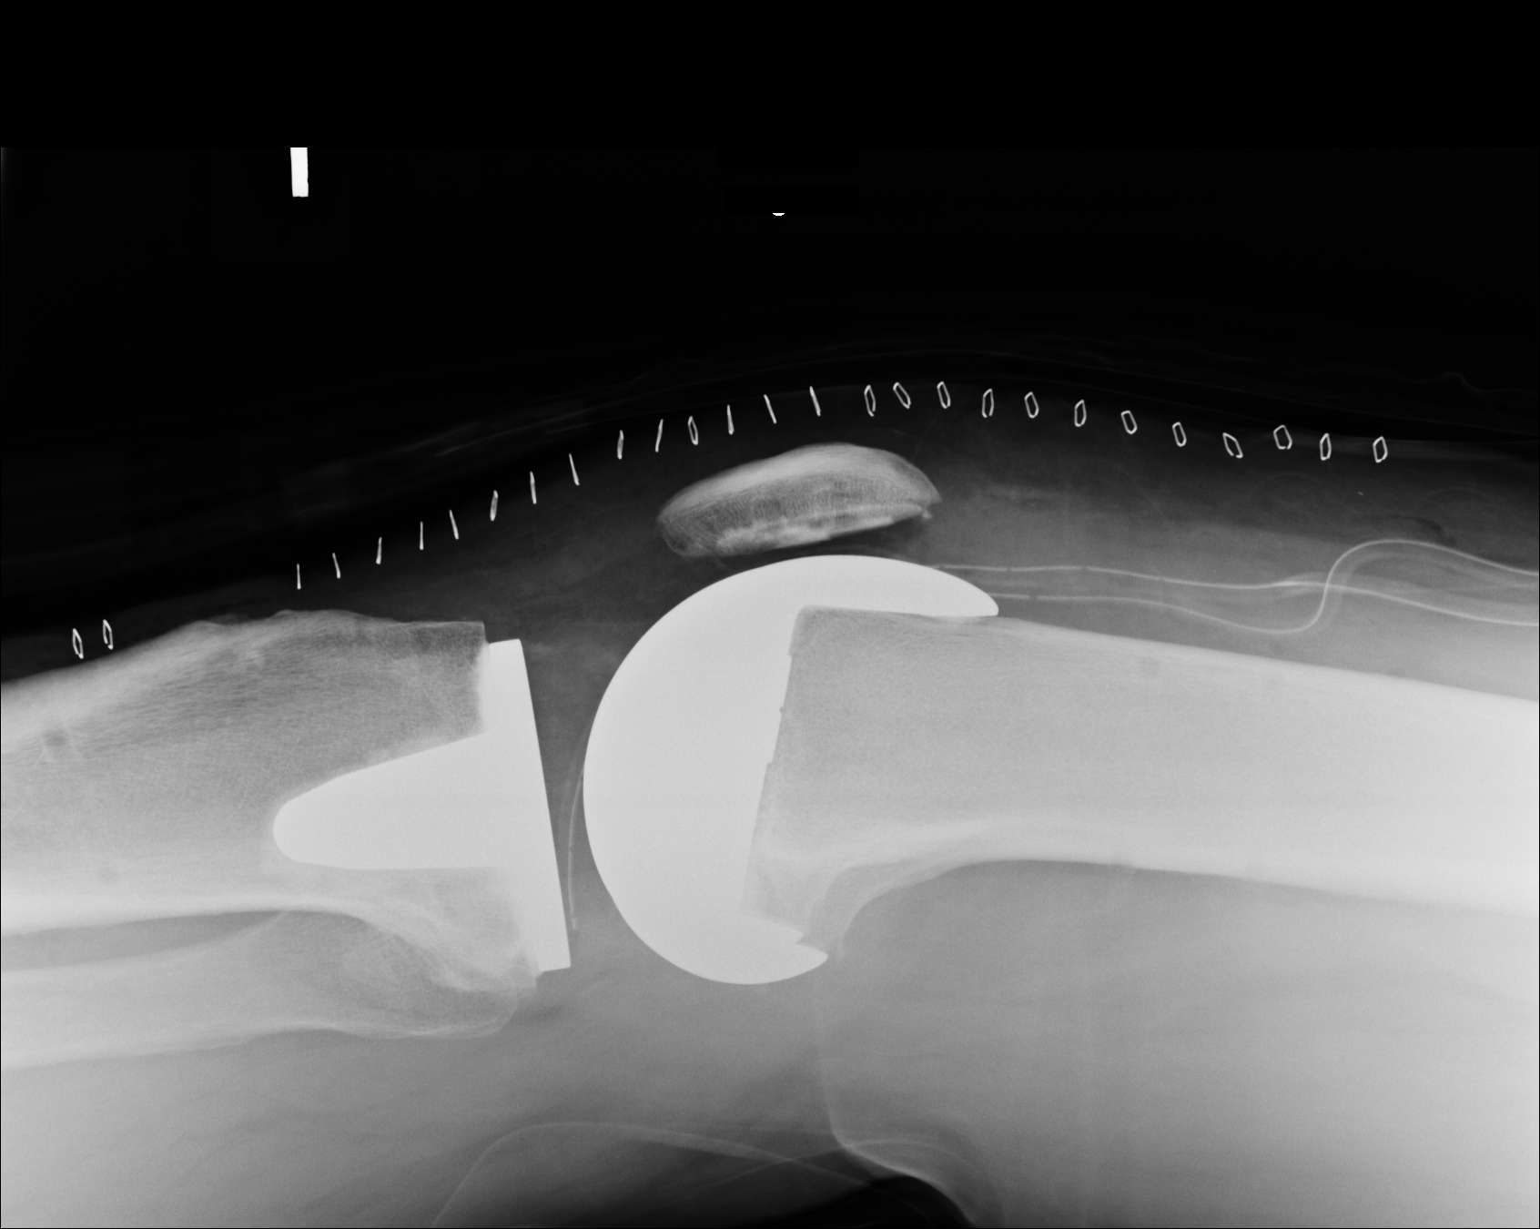

[2 of 2 positions shown; findings below may reference images not displayed]

IMPRESSION: Left knee arthroplasty, without perihardware lucency.

## 2011-02-26 ENCOUNTER — Ambulatory Visit: Payer: Self-pay | Admitting: Gastroenterology

## 2011-03-03 LAB — PATHOLOGY REPORT

## 2012-10-25 HISTORY — PX: KNEE ARTHROPLASTY: SHX992

## 2012-10-25 HISTORY — PX: JOINT REPLACEMENT: SHX530

## 2013-09-12 ENCOUNTER — Ambulatory Visit: Payer: Self-pay | Admitting: General Practice

## 2013-09-12 LAB — BASIC METABOLIC PANEL
Anion Gap: 5 — ABNORMAL LOW (ref 7–16)
BUN: 17 mg/dL (ref 7–18)
Calcium, Total: 8.4 mg/dL — ABNORMAL LOW (ref 8.5–10.1)
Creatinine: 1.02 mg/dL (ref 0.60–1.30)
EGFR (Non-African Amer.): >60
Glucose: 98 mg/dL (ref 65–99)
Osmolality: 281 (ref 275–301)
Potassium: 4 mmol/L (ref 3.5–5.1)

## 2013-09-12 LAB — URINALYSIS, COMPLETE
Bacteria: NONE SEEN
Bilirubin,UR: NEGATIVE
Glucose,UR: NEGATIVE mg/dL (ref 0–75)
Leukocyte Esterase: NEGATIVE
Nitrite: NEGATIVE
RBC,UR: NONE SEEN /HPF (ref 0–5)
Specific Gravity: 1.025 (ref 1.003–1.030)
WBC UR: NONE SEEN /HPF (ref 0–5)

## 2013-09-12 LAB — CBC
HCT: 44.5 % (ref 40.0–52.0)
MCV: 89 fL (ref 80–100)
Platelet: 262 10*3/uL (ref 150–440)
RBC: 4.98 10*6/uL (ref 4.40–5.90)
RDW: 13.9 % (ref 11.5–14.5)
WBC: 6.8 10*3/uL (ref 3.8–10.6)

## 2013-09-12 LAB — PROTIME-INR
INR: 1
Prothrombin Time: 13.3 secs (ref 11.5–14.7)

## 2013-09-12 LAB — MRSA PCR SCREENING

## 2013-09-12 LAB — APTT: Activated PTT: 31 secs (ref 23.6–35.9)

## 2013-10-01 ENCOUNTER — Inpatient Hospital Stay: Payer: Self-pay | Admitting: General Practice

## 2013-10-02 LAB — BASIC METABOLIC PANEL
Anion Gap: 4 — ABNORMAL LOW (ref 7–16)
BUN: 10 mg/dL (ref 7–18)
Calcium, Total: 7.7 mg/dL — ABNORMAL LOW (ref 8.5–10.1)
Co2: 28 mmol/L (ref 21–32)
EGFR (African American): >60
EGFR (Non-African Amer.): >60
Potassium: 4.3 mmol/L (ref 3.5–5.1)

## 2013-10-02 LAB — HEMOGLOBIN: HGB: 13.6 g/dL (ref 13.0–18.0)

## 2013-10-03 LAB — BASIC METABOLIC PANEL
Anion Gap: 6 — ABNORMAL LOW (ref 7–16)
BUN: 8 mg/dL (ref 7–18)
Calcium, Total: 8.2 mg/dL — ABNORMAL LOW (ref 8.5–10.1)
Chloride: 110 mmol/L — ABNORMAL HIGH (ref 98–107)
Co2: 25 mmol/L (ref 21–32)
EGFR (Non-African Amer.): >60
Osmolality: 281 (ref 275–301)
Potassium: 4.2 mmol/L (ref 3.5–5.1)

## 2013-10-03 LAB — PLATELET COUNT: Platelet: 180 10*3/uL (ref 150–440)

## 2013-10-03 LAB — HEMOGLOBIN: HGB: 13.6 g/dL (ref 13.0–18.0)

## 2013-11-27 IMAGING — CR DG KNEE 1-2V*R*
1 series · 2 of 2 positions shown · non-contrast
Comparison: None.

CLINICAL DATA: Postop right knee

EXAM:
RIGHT KNEE - 1-2 VIEW

[Series 1: ap · 0.17mm/px · 2 of 2 slices shown]
[im 1/2]
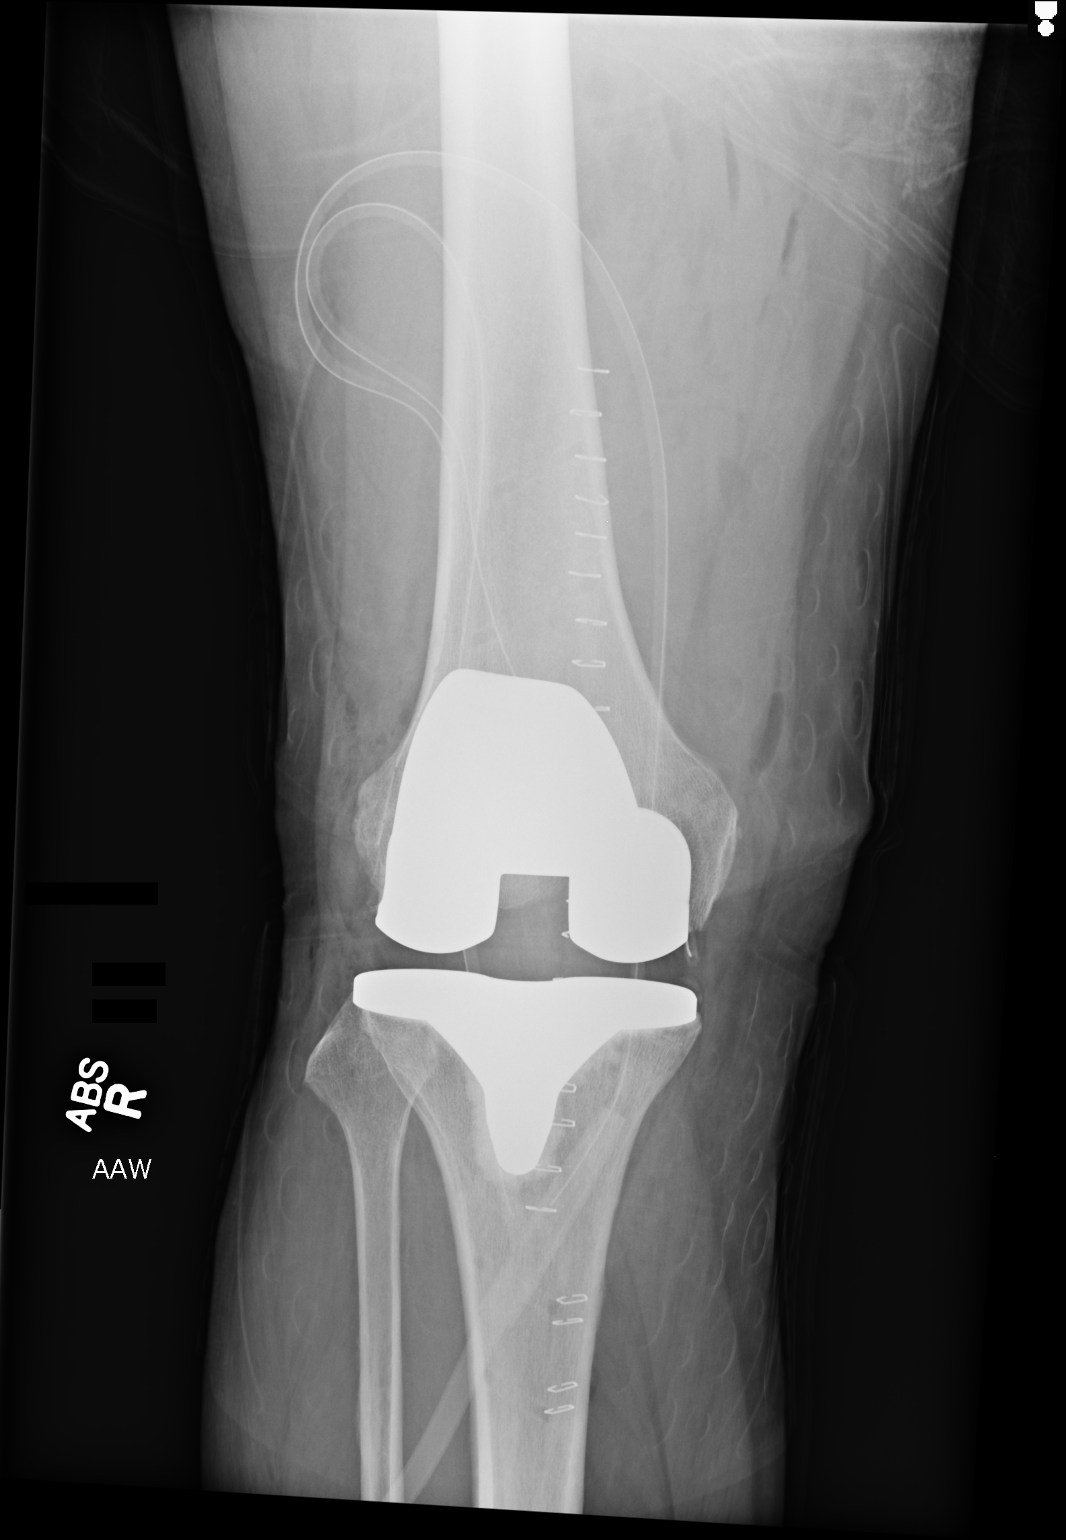
[im 2/2]
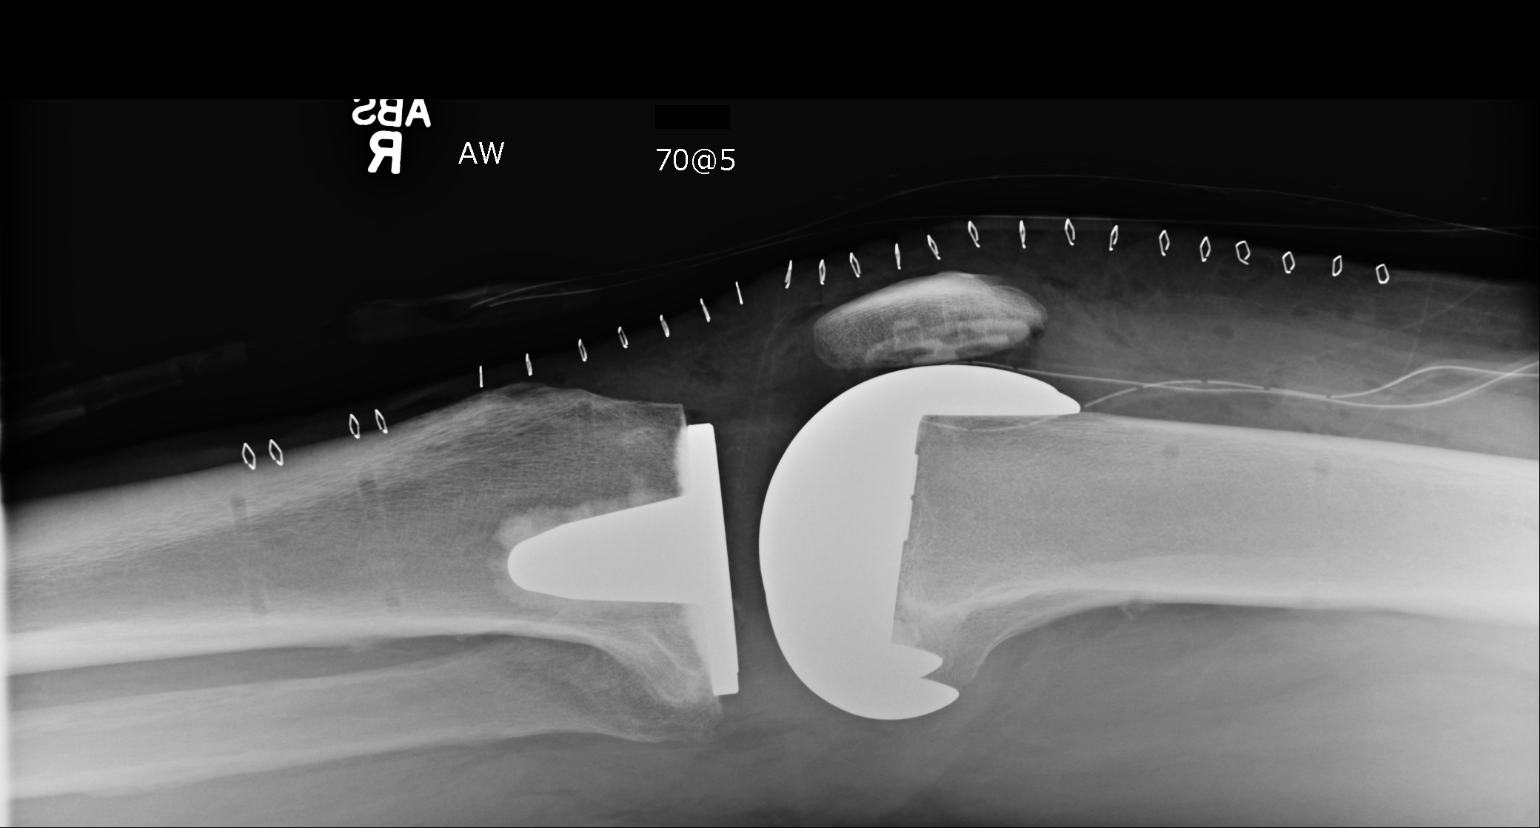

[2 of 2 positions shown; findings below may reference images not displayed]

FINDINGS: The femoral, tibial and patellar prosthetic components are
well-seated. There is no acute fracture or evidence of an operative
complication.
IMPRESSION: New right knee prosthesis is well-seated and aligned.

## 2014-04-04 DIAGNOSIS — R739 Hyperglycemia, unspecified: Secondary | ICD-10-CM | POA: Insufficient documentation

## 2014-04-04 DIAGNOSIS — E669 Obesity, unspecified: Secondary | ICD-10-CM | POA: Insufficient documentation

## 2015-01-20 DIAGNOSIS — M545 Low back pain: Secondary | ICD-10-CM

## 2015-01-20 DIAGNOSIS — R600 Localized edema: Secondary | ICD-10-CM | POA: Insufficient documentation

## 2015-01-20 DIAGNOSIS — G8929 Other chronic pain: Secondary | ICD-10-CM | POA: Insufficient documentation

## 2015-02-14 NOTE — Discharge Summary (Signed)
PATIENT NAME:  Donald [name], Donald [name] MR#:  [id number] DATE OF BIRTH:  03-28-1951  DATE OF ADMISSION:  10/01/2013 DATE OF DISCHARGE:  10/04/2013  ADMITTING DIAGNOSIS: Degenerative arthrosis of the right knee.   DISCHARGE DIAGNOSIS: Degenerative arthrosis of the right knee.   HISTORY: The patient is a pleasant 64 year old who has been followed at [facility] for progression of right knee pain. He has had a greater than 2 year history of right knee pain. He had done well following Synvisc injections to the right knee 2 years ago, but now reports progressive increase in his right knee pain. He had localized most of the pain along the medial aspect of the knee. Knee pain with noted to be aggravated with weight-bearing activities as well as stair ambulation. At the time of surgery, he was not using any ambulatory aid. The patient had also reported some decrease in his range of motion. He had denied any gross locking of the knee, but occasionally had some near giving way.   The patient had previously underwent a left total knee arthroplasty approximately 2-1/2 years ago and had done extremely well.   X-rays taken in [place] of the right knee showed bone-on-bone to the medial compartment space. Slight varus alignment was noted. He was noted to have tricompartmental degenerative changes, specifically to the patellofemoral articulation. He was noted to have extremely large osteophyte formation. Subchondral sclerosis was noted. After discussion of the risks and benefits of surgical intervention, the patient expressed his understanding of the risks and benefits and agreed for plans for surgical intervention.   PROCEDURE: Right total knee arthroplasty using computer-assisted navigation.   ANESTHESIA: Spinal.   SOFT TISSUE RELEASES: Anterior cruciate ligament, posterior cruciate ligament, deep medial collateral ligaments, as well as the patellofemoral ligament.   IMPLANTS UTILIZED DePuy PFC  Sigma size 5 posterior stabilized femoral component (cemented), size 5 MBT tibial component (cemented), 38 mm three-pegged oval dome patella (cemented), and a 10 mm stabilized rotating platform polyethylene insert.   HOSPITAL COURSE: The patient tolerated the procedure very well. He had no complications. He was then taken to PAC-U where he was stabilized and then transferred to the orthopedic floor. The patient began receiving anticoagulation therapy of Lovenox 30 mg subcu q. 12 hours per anesthesia and pharmacy protocol. He was fitted with TED stockings bilaterally. These were allowed to be removed 1 hour per 8 hour eight hour shift. The right one was applied on day 2 following removal of the Hemovac and dressing change. The patient was also fitted with the AVI compression foot pumps bilaterally set at 80 mmHg. His calves have been nontender. There has been no evidence of any DVTs. Negative Homans sign. Heels were elevated off the bed using rolled towels.   The patient has denied any chest pain or shortness of breath. Vital signs have been stable. He has been afebrile. Hemodynamically he is stable and no transfusions were given other than the Autovac transfusions given the first 6 hours postoperatively.   Physical therapy was initiated on day 1 for gait training and transfers. He has done extremely well. Upon being discharged he was ambulating greater than 250 feet. He was able go up and down 4 sets of steps. He was independent with bed to chair transfers. Occupational therapy was also initiated on day 1 for ADL and assistive devices.   The patient's IV, Foley, and Hemovac were DC'd on day 2 along with a dressing change. The wound was free of any drainage or  signs of infection. The Polar Care was reapplied to the surgical leg maintaining a temperature of 40 to 50 degrees Fahrenheit.   DISPOSITION: The patient is being discharged to home in improved stable condition.   DISCHARGE INSTRUCTIONS:  1.  He  can continue weight-bearing as tolerated.  2.  Continue using a walker until cleared by physical therapy to go to a quad cane.  3.  He will receive home health physical therapy for 2 weeks followed by outpatient therapy.  4.  Continue TED stockings. These are to be worn during the day but may be removed at night.  5.  Continue Polar Care maintaining a temperature of 40 to 50 degrees Fahrenheit. Recommend he use this around-the-clock for the first 2 weeks.  6.  He was instructed on wound care.  7.  He is placed on an ADA day diet.  8.  He has a follow-up appointment in [facility] orth[facility] December 22nd. He is to call the clinic sooner if any temperatures of 101.5 or greater or excessive bleeding.  9.  He is to resume his regular medications that he was on prior to admission. He was given a prescription for Roxicodone 5 to 10 mg q. 4 to 6 hours p.r.n. for pain, tramadol 50 to 100 mg q. 4 to 6 hours p.r.n. for pain, and Lovenox 40 mg subcu daily for 14 days and then discontinue and begin taking one 81 mg enteric-coated aspirin.  ____________________________ Vance Peper, PA [email or web address] D: 10/04/2013 07:15:51 ET T: 10/04/2013 07:47:30 ET JOB#: [id number]  cc: Vance Peper, PA, <Dictator> Chavy Avera PA ELECTRONICALLY SIGNED 10/09/2013 7:47

## 2015-02-14 NOTE — Op Note (Signed)
PATIENT NAME:  Donald [name], Donald [name] MR#:  [id number] DATE OF BIRTH:  Feb 26, 1951  DATE OF PROCEDURE:  10/01/2013  PREOPERATIVE DIAGNOSIS: Degenerative arthrosis of the right knee.   POSTOPERATIVE DIAGNOSIS: Degenerative arthrosis of the right knee.   PROCEDURE PERFORMED: Right total knee arthroplasty using computer-assisted navigation.   SURGEON: Laurice Record. Holley Bouche., M.D.   ASSISTANT: Vance Peper, PA (required to maintain retraction throughout the procedure).   ANESTHESIA: Spinal.   ESTIMATED BLOOD LOSS: 50 mL.   FLUIDS REPLACED: 2000 mL of crystalloid.   TOURNIQUET TIME: 108 minutes.   DRAINS: Two medium drains to reinfusion system.   SOFT TISSUE RELEASES: Anterior cruciate ligament, posterior cruciate ligament, deep medial collateral ligament and patellofemoral ligament.   IMPLANTS UTILIZED: DePuy PFC Sigma size 5 posterior stabilized femoral component (cemented), size 5 MBT tibial component (cemented), 38 mm 3 peg oval dome patella (cemented), and a 10 mm stabilized rotating platform polyethylene insert.   INDICATIONS FOR SURGERY: The patient is a 64 year old male who has been seen for complaints of progressive right knee pain. X-rays demonstrated severe degenerative changes in a tricompartmental fashion with relative varus deformity. After discussion of the risks and benefits of surgical intervention, the patient expressed understanding of the risks and benefits and agreed with plans for surgical intervention.   PROCEDURE IN DETAIL: The patient was brought into the operating room and, after adequate spinal anesthesia was achieved, a tourniquet was placed on the patient's upper right thigh. The patient's right knee and leg were cleaned and prepped with alcohol and DuraPrep and draped in the usual sterile fashion. A "timeout" was performed as per usual protocol. The right lower extremity was exsanguinated using an Esmarch, and the tourniquet was inflated to 300 mmHg. An anterior longitudinal  incision was made followed by a standard mid vastus approach. A moderate effusion was evacuated. The deep fibers of the medial collateral ligament were elevated in a subperiosteal fashion off the medial flare of the tibia so as maintain a continuous soft tissue sleeve. The patella was subluxed laterally, and the patellofemoral ligament was incised. Inspection of the knee demonstrated severe degenerative changes in tricompartmental fashion with full-thickness loss of articular cartilage to the medial compartment. Prominent osteophytes were debrided using a rongeur. Cervical osteochondral loose bodies were removed. The anterior and posterior cruciate ligaments were excised. Two 4.0 mm Schanz pins were inserted into the femur and into the tibia for attachment of the array of trackers used for computer-assisted navigation. Hip center was identified using circumduction technique. Distal landmarks were mapped using the computer. The distal femur and proximal tibia were mapped using the computer. Distal femoral cutting guide was positioned using computer-assisted navigation so as to achieve a 5-degree distal valgus cut. Cut was performed and verified using the computer. Sizing guide for the distal femur was positioned, and it was felt that a size 5 femoral component was appropriate. A size 5 cutting guide was positioned, and the anterior cut was performed and verified using the computer. This was followed by completion of the posterior and chamfer cuts. The femoral cutting guide for a central box was then positioned, and the central box cut was performed. Attention was then directed to the proximal tibia. Medial and lateral menisci were excised. The extramedullary tibial cutting guide was positioned using computer-assisted navigation so as to achieve 0-degree varus and valgus alignment and 0-degree posterior slope. Cut was performed and verified using the computer. The proximal tibia was sized, and it was felt that a size  5  tibial tray was appropriate. Tibial and femoral trials were inserted. Prominent posterior osteophytes were debrided from the femoral condyles. A 10 mm polyethylene trial was inserted, and the knee was placed through a range of motion. Excellent medial and lateral soft tissue balancing was appreciated both in full extension and in flexion. Finally, the patella was cut and prepared so as to accommodate a 38 mm 3 peg oval dome patella. Patellar trial was placed. The knee was placed through a range of motion with excellent patellar tracking appreciated. The femoral trial was removed. Central post hole for the tibial component was reamed followed by insertion of a keel punch. Tibial trial was then removed. Cut surfaces of bone were irrigated with copious amounts of normal saline with antibiotic solution using pulsatile lavage and then suctioned dry. Polymethyl methacrylate cement was prepared in the usual fashion using a vacuum mixer. Cement was applied to the cut surface of the proximal tibia as well as along the undersurface of a size 5 MBT tibial component. The tibial component was positioned and impacted into place. Excess cement was removed using [employer] fast streamer. Cement was then applied to the cut surface of the femur as well as on the posterior flanges of a size 5 posterior stabilized femoral component. Femoral component was positioned and impacted into place. Excess cement was removed using [employer] fast streamer. A 10 mm polyethylene trial was placed, and the knee was brought into full extension with steady axial compression applied. Finally, cement was applied to the backside of a 38 mm 3 peg oval dome patella, and the patellar component was positioned and patella clamp applied. Excess cement was removed using [employer] fast streamer.   After adequate curing of cement, the tourniquet was deflated after a total tourniquet time of 108 minutes. Hemostasis was achieved using electrocautery. The knee was irrigated with copious  amounts of normal saline with antibiotic solution using pulsatile lavage and then suctioned dry. The knee was inspected for any residual cement debris. Then, 20 mL of 1.3% Exparel and 40 mL of normal saline was injected along the posterior capsule, medial and lateral gutters, and along the arthrotomy site. A 10 mm stabilized rotating platform polyethylene insert was inserted, and the knee was placed through a range of motion with excellent patellar tracking appreciated and excellent medial and lateral soft tissue balancing noted. Two medium drains were placed in the wound bed and brought out through a separate stab incision to be attached to a reinfusion system. The medial parapatellar portion of the incision was then reapproximated using interrupted sutures of #1 Vicryl. The subcutaneous tissue was approximated in layers using first #0 Vicryl followed by #2-0 Vicryl. Then, 30 mL of 0.25% Marcaine with epinephrine was injected in the subcutaneous tissue along the incision site. Skin was then closed with skin staples. A sterile dressing was applied.   The patient tolerated the procedure well. He was transported to the recovery room in stable condition.   ____________________________ Laurice Record. Holley Bouche., MD [email or web address] D: 10/02/2013 00:04:14 ET T: 10/02/2013 00:25:22 ET JOB#: [id number]  cc: Laurice Record. Holley Bouche., MD, <Dictator> Marguis P Holley Bouche MD ELECTRONICALLY SIGNED 10/02/2013 22:40

## 2015-02-14 NOTE — Discharge Summary (Signed)
PATIENT NAME:  Donald [name], Donald [name] MR#:  [id number] DATE OF BIRTH:  [birth year]  DATE OF ADMISSION:  10/01/2013 DATE OF DISCHARGE: 10/04/2013  ADMITTING DIAGNOSIS: Degenerative arthrosis of the right knee.   DISCHARGE DIAGNOSIS: Degenerative arthrosis of the right knee.   HISTORY OF PRESENT ILLNESS: The patient is a 64 year old, who has been followed at [facility] for progression of right knee pain. He reported a greater than 2 year history of right knee pain. The patient had previously underwent a Synvisc injection and had done well 2 years ago. However, recently he has been having progressive increased right knee pain. The patient localized most of the pain along the medial aspect of his knee. He was noted to have increased pain with weight-bearing activities. Also had pain with stair ambulation. At the time of surgery, he was not using any ambulatory aid. The patient had also noticed some decrease in his range of motion. He denied any gross locking of the knee, but occasionally had some near giving way. He had previously underwent a left total knee arthroplasty 2-1/2 years ago and has done well. The patient had x-rays taken in [facility] orthopedics which showed bone-on-bone to the medial compartment space. There was slight varus alignment noted. He was noted to have tricompartmental degenerative changes specifically to the patellofemoral articulation. He was noted to have a very large osteophyte formation as well as subchondral sclerosis. After discussion of the risks and benefits of surgical intervention, the patient expressed his understanding of the risks and benefits and agreed for plans for surgical intervention.   PROCEDURE: Right total knee arthroplasty using computer-assisted navigation.   ANESTHESIA: Spinal.   SOFT TISSUE RELEASE: Anterior cruciate ligament, posterior cruciate ligament, deep medial collateral ligaments, as well as the patellofemoral ligament.   IMPLANTS UTILIZED:  DePuy PFC Sigma size 5 posterior stabilized femoral component (cemented), size 5 MBT tibial component (cemented), 38 mm 3-peg oval dome patella (cemented), and a 10 mm stabilized rotating platform polyethylene insert.   HOSPITAL COURSE: The patient tolerated the procedure very well. He had no complications. He was then taken to the PACU where he was stabilized and then transferred to the orthopedic floor. He began receiving anticoagulation therapy of Lovenox 30 mg subcutaneous every 12 hours per anesthesia and pharmacy protocol. He was fitted with TED stockings bilaterally. These are allowed to be removed 1hour per 8 hour shift. The patient was also fitted with AVI compression foot pumps bilaterally set at 80 mmHg. His calves have been nontender. There has been no evidence of any deep venous thromboses. Negative Homans sign. Heels were elevated off the bed using rolled towels.   The patient denied any chest pain or shortness of breath. Vital signs were stable. He has been afebrile. Hemodynamically he was stable. No transfusions were given other than the Autovac transfusions given the first 6 hours.   Physical therapy was initiated on day 1 for gait training and transfers. Upon being discharged, he was ambulating greater than 200 feet. He was able go up and down 4 sets of steps. He was independent with bed to chair transfers. Occupational therapy was also initiated on day 1 for ADLs and assistive devices.   The patient's IV, Foley and Hemovac were discontinued on day 2 along with a dressing change. The wound was free of any drainage or signs of infection. A Polar Care was reapplied to the surgical leg, maintaining a temperature of 40 to 50 degrees Fahrenheit.   The patient is being discharged to  home in improved stable condition.   DISCHARGE INSTRUCTIONS:  1.  He may weight bear as tolerated. Continue using a walker until cleared by physical therapy to go to a quad cane.  2.  He will receive home health  physical therapy for 2 weeks. After that time, he will be referred to outpatient therapy.  3.  Continue TED stockings. These are to be worn during the day, but may be removed at night.  4.  Continue Polar Care maintaining a temperature of 40 to 50 degrees Fahrenheit. Recommend that he use this around-the-clock for the first 2 weeks.  5.  He is placed on an ADA diet.  6.  He has a followup appointment on December 23. He is to call the clinic sooner if any temperatures of 101.5 or greater or excessive bleeding.  7.  He is to resume his regular medications that he was on prior to admission. He was given a prescription for oxycodone 5 to 10 mg every 4 to 6 hours p.r.n. for pain, tramadol 50 to 100 mg every 4 to 6 hours p.r.n. for pain, Lovenox 40 mg subcutaneously daily for 14 days, then discontinue and begin taking one 81 mg enteric-coated aspirin.   PAST MEDICAL HISTORY:  1.  Left inguinal hernia. 2.  Erectile dysfunction. 3.  Mild hyperlipidemia. 4.  Glucose intolerance.   ____________________________ Vance Peper, PA jrw:aw D: 10/04/2013 07:22:31 ET T: 10/04/2013 07:32:13 ET JOB#: [id number]  cc: Vance Peper, PA, <Dictator> Dymond Gutt PA ELECTRONICALLY SIGNED 10/09/2013 7:47

## 2015-05-20 ENCOUNTER — Encounter: Payer: Self-pay | Admitting: *Deleted

## 2015-05-20 DIAGNOSIS — I10 Essential (primary) hypertension: Secondary | ICD-10-CM | POA: Diagnosis not present

## 2015-05-20 DIAGNOSIS — H2511 Age-related nuclear cataract, right eye: Secondary | ICD-10-CM | POA: Diagnosis present

## 2015-05-20 DIAGNOSIS — F17220 Nicotine dependence, chewing tobacco, uncomplicated: Secondary | ICD-10-CM | POA: Diagnosis not present

## 2015-05-20 DIAGNOSIS — M1990 Unspecified osteoarthritis, unspecified site: Secondary | ICD-10-CM | POA: Diagnosis not present

## 2015-05-20 DIAGNOSIS — Z79899 Other long term (current) drug therapy: Secondary | ICD-10-CM | POA: Diagnosis not present

## 2015-05-20 DIAGNOSIS — Z7982 Long term (current) use of aspirin: Secondary | ICD-10-CM | POA: Diagnosis not present

## 2015-05-20 DIAGNOSIS — Z791 Long term (current) use of non-steroidal anti-inflammatories (NSAID): Secondary | ICD-10-CM | POA: Diagnosis not present

## 2015-05-20 DIAGNOSIS — Z9889 Other specified postprocedural states: Secondary | ICD-10-CM | POA: Diagnosis not present

## 2015-05-20 DIAGNOSIS — Z96653 Presence of artificial knee joint, bilateral: Secondary | ICD-10-CM | POA: Diagnosis not present

## 2015-06-03 ENCOUNTER — Ambulatory Visit
Admission: RE | Admit: 2015-06-03 | Discharge: 2015-06-03 | Disposition: A | Discharge status: Home / Self Care | Payer: BLUE CROSS/BLUE SHIELD | Source: Ambulatory Visit | Attending: Ophthalmology | Admitting: Ophthalmology

## 2015-06-03 ENCOUNTER — Encounter: Payer: Self-pay | Admitting: *Deleted

## 2015-06-03 ENCOUNTER — Encounter
Admission: RE | Disposition: A | Discharge status: Home / Self Care | Payer: Self-pay | Source: Ambulatory Visit | Attending: Ophthalmology

## 2015-06-03 ENCOUNTER — Ambulatory Visit: Payer: BLUE CROSS/BLUE SHIELD | Admitting: Anesthesiology

## 2015-06-03 DIAGNOSIS — Z79899 Other long term (current) drug therapy: Secondary | ICD-10-CM | POA: Insufficient documentation

## 2015-06-03 DIAGNOSIS — F17220 Nicotine dependence, chewing tobacco, uncomplicated: Secondary | ICD-10-CM | POA: Insufficient documentation

## 2015-06-03 DIAGNOSIS — H2511 Age-related nuclear cataract, right eye: Secondary | ICD-10-CM | POA: Diagnosis not present

## 2015-06-03 DIAGNOSIS — Z791 Long term (current) use of non-steroidal anti-inflammatories (NSAID): Secondary | ICD-10-CM | POA: Insufficient documentation

## 2015-06-03 DIAGNOSIS — I10 Essential (primary) hypertension: Secondary | ICD-10-CM | POA: Insufficient documentation

## 2015-06-03 DIAGNOSIS — Z7982 Long term (current) use of aspirin: Secondary | ICD-10-CM | POA: Insufficient documentation

## 2015-06-03 DIAGNOSIS — Z96653 Presence of artificial knee joint, bilateral: Secondary | ICD-10-CM | POA: Insufficient documentation

## 2015-06-03 DIAGNOSIS — Z9889 Other specified postprocedural states: Secondary | ICD-10-CM | POA: Insufficient documentation

## 2015-06-03 DIAGNOSIS — M1990 Unspecified osteoarthritis, unspecified site: Secondary | ICD-10-CM | POA: Insufficient documentation

## 2015-06-03 HISTORY — PX: CATARACT EXTRACTION W/PHACO: SHX586

## 2015-06-03 HISTORY — DX: Unspecified osteoarthritis, unspecified site: M19.90

## 2015-06-03 HISTORY — DX: Essential (primary) hypertension: I10

## 2015-06-03 SURGERY — PHACOEMULSIFICATION, CATARACT, WITH IOL INSERTION
Anesthesia: General | Site: Eye | Laterality: Right | Wound class: Clean

## 2015-06-03 MED ORDER — MOXIFLOXACIN HCL 0.5 % OP SOLN
OPHTHALMIC | Status: DC | PRN
  Administered 2015-06-03: 1 [drp] via OPHTHALMIC

## 2015-06-03 MED ORDER — CEFUROXIME OPHTHALMIC INJECTION 1 MG/0.1 ML
INJECTION | OPHTHALMIC | Status: DC | PRN
  Administered 2015-06-03: 0.1 mL via INTRACAMERAL

## 2015-06-03 MED ORDER — CEFUROXIME OPHTHALMIC INJECTION 1 MG/0.1 ML
INJECTION | OPHTHALMIC | Status: AC
  Filled 2015-06-03: qty 0.1

## 2015-06-03 MED ORDER — POVIDONE-IODINE 5 % OP SOLN
1.0000 "application " | OPHTHALMIC | Status: AC | PRN
  Administered 2015-06-03: 1 via OPHTHALMIC

## 2015-06-03 MED ORDER — CARBACHOL 0.01 % IO SOLN
INTRAOCULAR | Status: DC | PRN
  Administered 2015-06-03: 0.5 mL via INTRAOCULAR

## 2015-06-03 MED ORDER — TETRACAINE HCL 0.5 % OP SOLN
1.0000 [drp] | OPHTHALMIC | Status: AC | PRN
  Administered 2015-06-03: 1 [drp] via OPHTHALMIC

## 2015-06-03 MED ORDER — NA CHONDROIT SULF-NA HYALURON 40-17 MG/ML IO SOLN
INTRAOCULAR | Status: DC | PRN
  Administered 2015-06-03: 1 mL via INTRAOCULAR

## 2015-06-03 MED ORDER — SODIUM CHLORIDE 0.9 % IV SOLN
INTRAVENOUS | Status: DC
  Administered 2015-06-03: 08:00:00 via INTRAVENOUS

## 2015-06-03 MED ORDER — NA CHONDROIT SULF-NA HYALURON 40-17 MG/ML IO SOLN
INTRAOCULAR | Status: AC
  Filled 2015-06-03: qty 1

## 2015-06-03 MED ORDER — MIDAZOLAM HCL 2 MG/2ML IJ SOLN
INTRAMUSCULAR | Status: DC | PRN
  Administered 2015-06-03: 2 mg via INTRAVENOUS

## 2015-06-03 MED ORDER — EPINEPHRINE HCL 1 MG/ML IJ SOLN
INTRAMUSCULAR | Status: AC
  Filled 2015-06-03: qty 1

## 2015-06-03 MED ORDER — ARMC OPHTHALMIC DILATING GEL
1.0000 "application " | OPHTHALMIC | Status: DC | PRN
  Administered 2015-06-03: 1 via OPHTHALMIC

## 2015-06-03 MED ORDER — EPINEPHRINE HCL 1 MG/ML IJ SOLN
INTRAMUSCULAR | Status: DC | PRN
  Administered 2015-06-03: 200 mL via OPHTHALMIC

## 2015-06-03 SURGICAL SUPPLY — 22 items
CANNULA ANT/CHMB 27GA (MISCELLANEOUS) ×2 IMPLANT
CUP MEDICINE 2OZ PLAST GRAD ST (MISCELLANEOUS) ×2 IMPLANT
GLOVE BIO SURGEON STRL SZ8 (GLOVE) ×2 IMPLANT
GLOVE BIOGEL M 6.5 STRL (GLOVE) ×2 IMPLANT
GLOVE SURG LX 8.0 MICRO (GLOVE) ×1
GLOVE SURG LX STRL 8.0 MICRO (GLOVE) ×1 IMPLANT
GOWN STRL REUS W/ TWL LRG LVL3 (GOWN DISPOSABLE) ×2 IMPLANT
GOWN STRL REUS W/TWL LRG LVL3 (GOWN DISPOSABLE) ×2
LENS IOL TECNIS 20.5 (Intraocular Lens) ×2 IMPLANT
LENS IOL TECNIS MONO 1P 20.5 (Intraocular Lens) ×1 IMPLANT
PACK CATARACT (MISCELLANEOUS) ×2 IMPLANT
PACK CATARACT BRASINGTON LX (MISCELLANEOUS) ×2 IMPLANT
PACK EYE AFTER SURG (MISCELLANEOUS) ×2 IMPLANT
SOL BSS BAG (MISCELLANEOUS) ×2
SOL PREP PVP 2OZ (MISCELLANEOUS) ×2
SOLUTION BSS BAG (MISCELLANEOUS) ×1 IMPLANT
SOLUTION PREP PVP 2OZ (MISCELLANEOUS) ×1 IMPLANT
SYR 3ML LL SCALE MARK (SYRINGE) ×2 IMPLANT
SYR 5ML LL (SYRINGE) ×2 IMPLANT
SYR TB 1ML 27GX1/2 LL (SYRINGE) ×2 IMPLANT
WATER STERILE IRR 1000ML POUR (IV SOLUTION) ×2 IMPLANT
WIPE NON LINTING 3.25X3.25 (MISCELLANEOUS) ×2 IMPLANT

## 2015-06-03 NOTE — Anesthesia Postprocedure Evaluation (Signed)
  Anesthesia Post-op Note  Patient: Donald [name]  Procedure(s) Performed: Procedure(s) with comments: CATARACT EXTRACTION PHACO AND INTRAOCULAR LENS PLACEMENT (IOC) (Right) - [place]: 01:26.5 AP%: 19.7 CDE: 17.07  Fluid lot# [id number]  Anesthesia type: MAC  Patient location: PACU  Post pain: Pain level controlled  Post assessment: Post-op Vital signs reviewed, Patient's Cardiovascular Status Stable, Respiratory Function Stable, Patent Airway and No signs of Nausea or vomiting  Post vital signs: Reviewed and stable  Last Vitals:  Filed Vitals:   06/03/15 0943  BP: 148/82  Pulse: 65  Temp: 36.7 C  Resp: 12    Level of consciousness: awake, alert  and patient cooperative  Complications: No apparent anesthesia complications

## 2015-06-03 NOTE — Anesthesia Preprocedure Evaluation (Signed)
Anesthesia Evaluation  Patient identified by MRN, date of birth, ID band Patient awake    Reviewed: Allergy & Precautions, H&P , NPO status , Patient's Chart, lab work & pertinent test results, reviewed documented beta blocker date and time   History of Anesthesia Complications Negative for: history of anesthetic complications  Airway Mallampati: II  TM Distance: >3 FB Neck ROM: full    Dental no notable dental hx.  Many gold crowns:   Pulmonary neg pulmonary ROS,  breath sounds clear to auscultation  Pulmonary exam normal       Cardiovascular Exercise Tolerance: Good hypertension, - angina- CAD, - Past MI and - CABG Normal cardiovascular examRhythm:regular Rate:Normal     Neuro/Psych negative neurological ROS  negative psych ROS   GI/Hepatic negative GI ROS, Neg liver ROS,   Endo/Other  negative endocrine ROS  Renal/GU negative Renal ROS  negative genitourinary   Musculoskeletal   Abdominal   Peds  Hematology negative hematology ROS (+)   Anesthesia Other Findings Past Medical History:   Hypertension                                                 Arthritis                                                    Reproductive/Obstetrics negative OB ROS                             Anesthesia Physical Anesthesia Plan  ASA: II  Anesthesia Plan: General   Post-op Pain Management:    Induction:   Airway Management Planned:   Additional Equipment:   Intra-op Plan:   Post-operative Plan:   Informed Consent: I have reviewed the patients History and Physical, chart, labs and discussed the procedure including the risks, benefits and alternatives for the proposed anesthesia with the patient or authorized representative who has indicated his/her understanding and acceptance.   Dental Advisory Given  Plan Discussed with: Anesthesiologist, CRNA and Surgeon  Anesthesia Plan Comments:          Anesthesia Quick Evaluation

## 2015-06-03 NOTE — Op Note (Signed)
PREOPERATIVE DIAGNOSIS:  Nuclear sclerotic cataract of the right eye.   POSTOPERATIVE DIAGNOSIS: Right nuclear sclerotic cataract   OPERATIVE PROCEDURE:  Procedure(s): CATARACT EXTRACTION PHACO AND INTRAOCULAR LENS PLACEMENT (IOC)   SURGEON:  Birder Robson, MD.   ANESTHESIA:  Anesthesiologist: Martha Clan, MD CRNA: Aline Brochure, CRNA  1.      Managed anesthesia care. 2.      Topical tetracaine drops followed by 2% Xylocaine jelly applied in the preoperative holding area.   COMPLICATIONS:  None.   TECHNIQUE:   Stop and chop   DESCRIPTION OF PROCEDURE:  The patient was examined and consented in the preoperative holding area where the aforementioned topical anesthesia was applied to the right eye and then brought back to the Operating Room where the right eye was prepped and draped in the usual sterile ophthalmic fashion and a lid speculum was placed. A paracentesis was created with the side port blade and the anterior chamber was filled with viscoelastic. A near clear corneal incision was performed with the steel keratome. A continuous curvilinear capsulorrhexis was performed with a cystotome followed by the capsulorrhexis forceps. Hydrodissection and hydrodelineation were carried out with BSS on a blunt cannula. The lens was removed in a stop and chop  technique and the remaining cortical material was removed with the irrigation-aspiration handpiece. The capsular bag was inflated with viscoelastic and the Technis ZCB00  lens was placed in the capsular bag without complication. The remaining viscoelastic was removed from the eye with the irrigation-aspiration handpiece. The wounds were hydrated. The anterior chamber was flushed with Miostat and the eye was inflated to physiologic pressure. 0.1 mL of cefuroxime concentration 10 mg/mL was placed in the anterior chamber. The wounds were found to be water tight. The eye was dressed with Vigamox. The patient was given protective glasses to  wear throughout the day and a shield with which to sleep tonight. The patient was also given drops with which to begin a drop regimen today and will follow-up with me in one day.  Implant Name Type Inv. Item Serial No. Manufacturer Lot No. LRB No. Used  LENS IMPL INTRAOC ZCB00 20.5 - [id number] Intraocular Lens LENS IMPL INTRAOC ZCB00 20.5 [id number] AMO   Right 1   Procedure(s) with comments: CATARACT EXTRACTION PHACO AND INTRAOCULAR LENS PLACEMENT (IOC) (Right) - [place]: 01:26.5 AP%: 19.7 CDE: 17.07  Fluid lot# [id number]  Electronically signed: Royal 06/03/2015 9:41 AM

## 2015-06-03 NOTE — Transfer of Care (Signed)
Immediate Anesthesia Transfer of Care Note  Patient: Donald [name]  Procedure(s) Performed: Procedure(s) with comments: CATARACT EXTRACTION PHACO AND INTRAOCULAR LENS PLACEMENT (IOC) (Right) - [place]: 01:26.5 AP%: 19.7 CDE: 17.07  Fluid lot# [id number]  Patient Location: PACU  Anesthesia Type:MAC  Level of Consciousness: awake, alert  and oriented  Airway & Oxygen Therapy: Patient Spontanous Breathing  Post-op Assessment: Report given to RN and Post -op Vital signs reviewed and stable  Post vital signs: stable  Last Vitals:  Filed Vitals:   06/03/15 0943  BP: 148/82  Pulse: 65  Temp: 36.7 C  Resp: 12    Complications: No apparent anesthesia complications

## 2015-06-03 NOTE — H&P (Signed)
  All labs reviewed. Abnormal studies sent to patients PCP when indicated.  Previous H&P reviewed, patient examined, there are NO CHANGES.  Donald [name] LOUIS8/9/20169:13 AM

## 2015-11-17 DIAGNOSIS — Z96651 Presence of right artificial knee joint: Secondary | ICD-10-CM | POA: Insufficient documentation

## 2015-11-17 DIAGNOSIS — Z96652 Presence of left artificial knee joint: Secondary | ICD-10-CM | POA: Insufficient documentation

## 2016-05-11 DIAGNOSIS — M47816 Spondylosis without myelopathy or radiculopathy, lumbar region: Secondary | ICD-10-CM | POA: Insufficient documentation

## 2016-05-11 DIAGNOSIS — M1611 Unilateral primary osteoarthritis, right hip: Secondary | ICD-10-CM | POA: Insufficient documentation

## 2016-08-13 ENCOUNTER — Encounter: Payer: Self-pay | Admitting: *Deleted

## 2016-08-16 ENCOUNTER — Ambulatory Visit: Payer: BLUE CROSS/BLUE SHIELD | Admitting: Anesthesiology

## 2016-08-16 ENCOUNTER — Encounter: Payer: Self-pay | Admitting: *Deleted

## 2016-08-16 ENCOUNTER — Encounter
Admission: RE | Disposition: A | Discharge status: Home / Self Care | Payer: Self-pay | Source: Ambulatory Visit | Attending: Gastroenterology

## 2016-08-16 ENCOUNTER — Ambulatory Visit
Admission: RE | Admit: 2016-08-16 | Discharge: 2016-08-16 | Disposition: A | Discharge status: Home / Self Care | Payer: BLUE CROSS/BLUE SHIELD | Source: Ambulatory Visit | Attending: Gastroenterology | Admitting: Gastroenterology

## 2016-08-16 DIAGNOSIS — Z79899 Other long term (current) drug therapy: Secondary | ICD-10-CM | POA: Insufficient documentation

## 2016-08-16 DIAGNOSIS — Z1211 Encounter for screening for malignant neoplasm of colon: Secondary | ICD-10-CM | POA: Insufficient documentation

## 2016-08-16 DIAGNOSIS — Z86010 Personal history of colonic polyps: Secondary | ICD-10-CM | POA: Diagnosis not present

## 2016-08-16 DIAGNOSIS — D123 Benign neoplasm of transverse colon: Secondary | ICD-10-CM | POA: Insufficient documentation

## 2016-08-16 DIAGNOSIS — I10 Essential (primary) hypertension: Secondary | ICD-10-CM | POA: Diagnosis not present

## 2016-08-16 DIAGNOSIS — D125 Benign neoplasm of sigmoid colon: Secondary | ICD-10-CM | POA: Insufficient documentation

## 2016-08-16 DIAGNOSIS — Z7982 Long term (current) use of aspirin: Secondary | ICD-10-CM | POA: Diagnosis not present

## 2016-08-16 DIAGNOSIS — M1990 Unspecified osteoarthritis, unspecified site: Secondary | ICD-10-CM | POA: Diagnosis not present

## 2016-08-16 DIAGNOSIS — K621 Rectal polyp: Secondary | ICD-10-CM | POA: Insufficient documentation

## 2016-08-16 DIAGNOSIS — E785 Hyperlipidemia, unspecified: Secondary | ICD-10-CM | POA: Diagnosis not present

## 2016-08-16 HISTORY — DX: Hyperlipidemia, unspecified: E78.5

## 2016-08-16 HISTORY — PX: COLONOSCOPY: SHX5424

## 2016-08-16 SURGERY — COLONOSCOPY
Anesthesia: General

## 2016-08-16 MED ORDER — PROPOFOL 500 MG/50ML IV EMUL
INTRAVENOUS | Status: DC | PRN
  Administered 2016-08-16: 150 ug/kg/min via INTRAVENOUS

## 2016-08-16 MED ORDER — LIDOCAINE HCL (CARDIAC) 20 MG/ML IV SOLN
INTRAVENOUS | Status: DC | PRN
  Administered 2016-08-16: 60 mg via INTRAVENOUS

## 2016-08-16 MED ORDER — PROPOFOL 10 MG/ML IV BOLUS
INTRAVENOUS | Status: DC | PRN
  Administered 2016-08-16: 50 mg via INTRAVENOUS

## 2016-08-16 MED ORDER — SODIUM CHLORIDE 0.9 % IV SOLN
2.0000 g | Freq: Once | INTRAVENOUS | Status: AC
  Administered 2016-08-16: 2 g via INTRAVENOUS
  Filled 2016-08-16: qty 2000

## 2016-08-16 MED ORDER — SODIUM CHLORIDE 0.9 % IV SOLN
INTRAVENOUS | Status: DC
Start: 2016-08-16 — End: 2016-08-16

## 2016-08-16 MED ORDER — SODIUM CHLORIDE 0.9 % IV SOLN
INTRAVENOUS | Status: DC
  Administered 2016-08-16: 1000 mL via INTRAVENOUS

## 2016-08-16 NOTE — H&P (Signed)
Outpatient short stay form Pre-procedure 08/16/2016 8:21 AM Lollie Sails MD  Primary Physician: Dr. Glendon Axe  Reason for visit:  Colonoscopy  History of present illness:  Patient has a personal history of adenomatous colon polyps. He tolerated his prep well. He does take a 325 mg aspirin daily but has held that for about 5 days. He takes no other blood thinning agents. He takes no other aspirin products.    Current Facility-Administered Medications:  .  0.9 %  sodium chloride infusion, , Intravenous, Continuous, Lollie Sails, MD, Last Rate: 20 mL/hr at 08/16/16 0802, 1,000 mL at 08/16/16 0802 .  0.9 %  sodium chloride infusion, , Intravenous, Continuous, Lollie Sails, MD .  ampicillin (OMNIPEN) 2 g in sodium chloride 0.9 % 50 mL IVPB, 2 g, Intravenous, Once, Lollie Sails, MD, 2 g at 08/16/16 [id number]  Prescriptions Prior to Admission  Medication Sig Dispense Refill Last Dose  . aspirin 325 MG tablet Take 325 mg by mouth daily.     . Investigational omega-3-fatty acid/placebo capsule S0927 Take 3 capsules by mouth 2 (two) times daily. Take with food.     . hydrochlorothiazide (MICROZIDE) 12.5 MG capsule Take 12.5 mg by mouth daily.   06/02/2015 at Unknown time  . meloxicam (MOBIC) 15 MG tablet Take 15 mg by mouth daily. As needed   Past Week at Unknown time  . sildenafil (REVATIO) 20 MG tablet Take 20 mg by mouth 3 (three) times daily.   06/02/2015 at Unknown time  . Testosterone (AXIRON TD) Place 30 mg onto the skin.   06/02/2015 at Unknown time     No Known Allergies   Past Medical History:  Diagnosis Date  . Arthritis   . Hyperlipidemia   . Hypertension     Review of systems:      Physical Exam    Heart and lungs: Regular rate and rhythm without rub or gallop, lungs are bilaterally clear.    HEENT: Normocephalic atraumatic eyes are anicteric    Other:     Pertinant exam for procedure: Soft nontender nondistended. protuberant, bowel sounds are positive  normoactive    Planned proceedures: Colonoscopy and indicated procedures. I have discussed the risks benefits and complications of procedures to include not limited to bleeding, infection, perforation and the risk of sedation and the patient wishes to proceed.    Lollie Sails, MD Gastroenterology 08/16/2016  8:21 AM

## 2016-08-16 NOTE — Transfer of Care (Signed)
Immediate Anesthesia Transfer of Care Note  Patient: Donald [name]  Procedure(s) Performed: Procedure(s): COLONOSCOPY (N/A)  Patient Location: Endoscopy Unit  Anesthesia Type:General  Level of Consciousness: awake, alert , oriented and patient cooperative  Airway & Oxygen Therapy: Patient Spontanous Breathing and Patient connected to nasal cannula oxygen  Post-op Assessment: Report given to RN, Post -op Vital signs reviewed and stable and Patient moving all extremities X 4  Post vital signs: Reviewed and stable  Last Vitals:  Vitals:   08/16/16 0747  BP: (!) 147/70  Pulse: 74  Resp: 16  Temp: (!) 36.1 C    Last Pain:  Vitals:   08/16/16 0747  TempSrc: Tympanic         Complications: No apparent anesthesia complications

## 2016-08-16 NOTE — Op Note (Signed)
[facility] Gastroenterology Patient Name: Donald [name] Procedure Date: 08/16/2016 8:24 AM MRN: [id number] Account #: [id number] Date of Birth: 1950-11-11 Admit Type: Outpatient Age: 65 Room: [facility] ENDO ROOM 3 Gender: Male Note Status: Finalized Procedure:            Colonoscopy Indications:          Personal history of colonic polyps Providers:            Lollie Sails, MD Referring MD:         Glendon Axe (Referring MD) Medicines:            Monitored Anesthesia Care Complications:        No immediate complications. Procedure:            Pre-Anesthesia Assessment:                       - ASA Grade Assessment: II - A patient with mild                        systemic disease.                       After obtaining informed consent, the colonoscope was                        passed under direct vision. Throughout the procedure,                        the patient's blood pressure, pulse, and oxygen                        saturations were monitored continuously. The                        Colonoscope was introduced through the anus and                        advanced to the the cecum, identified by appendiceal                        orifice and ileocecal valve. The quality of the bowel                        preparation was good. Findings:      Three sessile polyps were found in the distal sigmoid colon. The polyps       were less than 1 mm in size. These polyps were removed with a cold       biopsy forceps. Resection and retrieval were complete.      A 5 mm polyp was found in the proximal transverse colon. The polyp was       sessile. The polyp was removed with a cold biopsy forceps. Resection and       retrieval were complete.      A 1 mm polyp was found in the rectum. The polyp was sessile. The polyp       was removed with a cold biopsy forceps. Resection and retrieval were       complete.      There was a small lipoma, 20 mm in diameter, in the  transverse colon.       Positive pillow sign. Impression:           -  Three less than 1 mm polyps in the distal sigmoid                        colon, removed with a cold biopsy forceps. Resected and                        retrieved.                       - One 5 mm polyp in the proximal transverse colon,                        removed with a cold biopsy forceps. Resected and                        retrieved.                       - One 1 mm polyp in the rectum, removed with a cold                        biopsy forceps. Resected and retrieved.                       - Small lipoma in the transverse colon. Recommendation:       - Discharge patient to home.                       - Await pathology results.                       - Telephone GI clinic for pathology results in 1 week. Procedure Code(s):    --- Professional ---                       [phone], Colonoscopy, flexible; with biopsy, single or                        multiple Diagnosis Code(s):    --- Professional ---                       D12.5, Benign neoplasm of sigmoid colon                       D12.3, Benign neoplasm of transverse colon (hepatic                        flexure or splenic flexure)                       K62.1, Rectal polyp                       D17.5, Benign lipomatous neoplasm of intra-abdominal                        organs                       Z86.010, Personal history of colonic polyps CPT copyright 2016 American Medical Association. All rights reserved. The codes documented in this report are preliminary and upon coder review may  be revised to meet current  compliance requirements. Lollie Sails, MD 08/16/2016 8:58:10 AM This report has been signed electronically. Number of Addenda: 0 Note Initiated On: 08/16/2016 8:24 AM Scope Withdrawal Time: 0 hours 9 minutes 59 seconds  Total Procedure Duration: 0 hours 23 minutes 26 seconds       [facility]

## 2016-08-16 NOTE — Anesthesia Postprocedure Evaluation (Signed)
Anesthesia Post Note  Patient: Docia Chuck  Procedure(s) Performed: Procedure(s) (LRB): COLONOSCOPY (N/A)  Patient location during evaluation: Endoscopy Anesthesia Type: General Level of consciousness: awake and alert and oriented Pain management: pain level controlled Vital Signs Assessment: post-procedure vital signs reviewed and stable Respiratory status: spontaneous breathing, nonlabored ventilation and respiratory function stable Cardiovascular status: blood pressure returned to baseline and stable Postop Assessment: no signs of nausea or vomiting Anesthetic complications: no    Last Vitals:  Vitals:   08/16/16 0908 08/16/16 0918  BP: 113/63 116/80  Pulse: 78 71  Resp: 13 15  Temp:      Last Pain:  Vitals:   08/16/16 0858  TempSrc: Tympanic  PainSc: Asleep                 Oddie Bottger

## 2016-08-16 NOTE — Anesthesia Preprocedure Evaluation (Signed)
Anesthesia Evaluation  Patient identified by MRN, date of birth, ID band Patient awake    Reviewed: Allergy & Precautions, NPO status , Patient's Chart, lab work & pertinent test results  History of Anesthesia Complications Negative for: history of anesthetic complications  Airway Mallampati: II  TM Distance: >3 FB Neck ROM: Full    Dental  (+) Poor Dentition   Pulmonary neg pulmonary ROS, neg sleep apnea, neg COPD,    breath sounds clear to auscultation- rhonchi (-) wheezing      Cardiovascular Exercise Tolerance: Good hypertension, Pt. on medications (-) CAD and (-) Past MI  Rhythm:Regular Rate:Normal - Systolic murmurs and - Diastolic murmurs    Neuro/Psych negative neurological ROS  negative psych ROS   GI/Hepatic negative GI ROS, Neg liver ROS,   Endo/Other  negative endocrine ROSneg diabetes  Renal/GU negative Renal ROS     Musculoskeletal  (+) Arthritis , Osteoarthritis,    Abdominal (+) - obese,   Peds  Hematology negative hematology ROS (+)   Anesthesia Other Findings Past Medical History: No date: Arthritis No date: Hyperlipidemia No date: Hypertension   Reproductive/Obstetrics                             Anesthesia Physical Anesthesia Plan  ASA: II  Anesthesia Plan: General   Post-op Pain Management:    Induction: Intravenous  Airway Management Planned: Natural Airway  Additional Equipment:   Intra-op Plan:   Post-operative Plan:   Informed Consent: I have reviewed the patients History and Physical, chart, labs and discussed the procedure including the risks, benefits and alternatives for the proposed anesthesia with the patient or authorized representative who has indicated his/her understanding and acceptance.   Dental advisory given  Plan Discussed with: CRNA and Anesthesiologist  Anesthesia Plan Comments:         Anesthesia Quick Evaluation

## 2016-08-17 ENCOUNTER — Encounter: Payer: Self-pay | Admitting: Gastroenterology

## 2016-08-17 LAB — SURGICAL PATHOLOGY

## 2016-11-23 ENCOUNTER — Encounter
Admission: RE | Admit: 2016-11-23 | Discharge: 2016-11-23 | Disposition: A | Discharge status: Home / Self Care | Payer: BLUE CROSS/BLUE SHIELD | Source: Ambulatory Visit | Attending: Orthopedic Surgery | Admitting: Orthopedic Surgery

## 2016-11-23 DIAGNOSIS — R001 Bradycardia, unspecified: Secondary | ICD-10-CM | POA: Insufficient documentation

## 2016-11-23 DIAGNOSIS — Z01810 Encounter for preprocedural cardiovascular examination: Secondary | ICD-10-CM | POA: Insufficient documentation

## 2016-11-23 DIAGNOSIS — I10 Essential (primary) hypertension: Secondary | ICD-10-CM | POA: Insufficient documentation

## 2016-11-23 DIAGNOSIS — Z01812 Encounter for preprocedural laboratory examination: Secondary | ICD-10-CM | POA: Diagnosis present

## 2016-11-23 HISTORY — DX: Other specified postprocedural states: Z98.890

## 2016-11-23 HISTORY — DX: Nausea with vomiting, unspecified: R11.2

## 2016-11-23 LAB — COMPREHENSIVE METABOLIC PANEL
ALBUMIN: 4.2 g/dL (ref 3.5–5.0)
ALT: 25 U/L (ref 17–63)
ANION GAP: 7 (ref 5–15)
AST: 23 U/L (ref 15–41)
Alkaline Phosphatase: 82 U/L (ref 38–126)
BUN: 17 mg/dL (ref 6–20)
CALCIUM: 8.9 mg/dL (ref 8.9–10.3)
CHLORIDE: 106 mmol/L (ref 101–111)
CO2: 26 mmol/L (ref 22–32)
Creatinine, Ser: 1.03 mg/dL (ref 0.61–1.24)
GFR calc Af Amer: >60 mL/min (ref >60)
GFR calc non Af Amer: >60 mL/min (ref >60)
GLUCOSE: 110 mg/dL — AB (ref 65–99)
Potassium: 4.7 mmol/L (ref 3.5–5.1)
SODIUM: 139 mmol/L (ref 135–145)
Total Bilirubin: 0.7 mg/dL (ref 0.3–1.2)
Total Protein: 7.2 g/dL (ref 6.5–8.1)

## 2016-11-23 LAB — PROTIME-INR

## 2016-11-23 LAB — CBC
HCT: 52.3 % — ABNORMAL HIGH (ref 40.0–52.0)
HEMOGLOBIN: 17.7 g/dL (ref 13.0–18.0)
MCH: 30.9 pg (ref 26.0–34.0)
MCHC: 34 g/dL (ref 32.0–36.0)
MCV: 90.9 fL (ref 80.0–100.0)
PLATELETS: 232 10*3/uL (ref 150–440)
RBC: 5.75 MIL/uL (ref 4.40–5.90)
RDW: 14.9 % — ABNORMAL HIGH (ref 11.5–14.5)
WBC: 6.3 10*3/uL (ref 3.8–10.6)

## 2016-11-23 LAB — C-REACTIVE PROTEIN: CRP: <0.8 mg/dL (ref <1.0)

## 2016-11-23 LAB — SEDIMENTATION RATE: Sed Rate: 1 mm/hr (ref 0–20)

## 2016-11-23 LAB — URINALYSIS, ROUTINE W REFLEX MICROSCOPIC
Bilirubin Urine: NEGATIVE
Glucose, UA: NEGATIVE mg/dL
HGB URINE DIPSTICK: NEGATIVE
Ketones, ur: NEGATIVE mg/dL
LEUKOCYTES UA: NEGATIVE
NITRITE: NEGATIVE
PROTEIN: NEGATIVE mg/dL
SPECIFIC GRAVITY, URINE: 1.01 (ref 1.005–1.030)
pH: 6 (ref 5.0–8.0)

## 2016-11-23 LAB — SURGICAL PCR SCREEN
MRSA, PCR: NEGATIVE
STAPHYLOCOCCUS AUREUS: NEGATIVE

## 2016-11-23 NOTE — Pre-Procedure Instructions (Signed)
EKG OK'ED BY DR Gildardo Griffes

## 2016-11-23 NOTE — Patient Instructions (Signed)
  Your procedure is scheduled on:Wednesday Feb. 14 , 2018. Report to Same Day Surgery. To find out your arrival time please call [phone] between 1PM - 3PM on Tuesday Feb. 13, 2018.  Remember: Instructions that are not followed completely may result in serious medical risk, up to and including death, or upon the discretion of your surgeon and anesthesiologist your surgery may need to be rescheduled.    _x___ 1. Do not eat food or drink liquids after midnight. No gum chewing or hard candies.     _x___ 2. No Alcohol for 24 hours before or after surgery.   ____ 3. Bring all medications with you on the day of surgery if instructed.    __x__ 4. Notify your doctor if there is any change in your medical condition     (cold, fever, infections).    _____ 5. No smoking 24 hours prior to surgery.     Do not wear jewelry, make-up, hairpins, clips or nail polish.  Do not wear lotions, powders, or perfumes.   Do not shave 48 hours prior to surgery. Men may shave face and neck.  Do not bring valuables to the hospital.    [facility] is not responsible for any belongings or valuables.               Contacts, dentures or bridgework may not be worn into surgery.  Leave your suitcase in the car. After surgery it may be brought to your room.  For patients admitted to the hospital, discharge time is determined by your treatment team.   Patients discharged the day of surgery will not be allowed to drive home.    Please read over the following fact sheets that you were given:   [facility]  ____ Take these medicines the morning of surgery with A SIP OF WATER: None     ____ Fleet Enema (as directed)   __x__ Use CHG Soap as directed on instruction sheet  ____ Use inhalers on the day of surgery and bring to hospital day of surgery  ____ Stop metformin 2 days prior to surgery    ____ Take 1/2 of usual insulin dose the night before surgery and none on the morning of  surgery.   __x__ Stop aspirin as directed by Dr. Clydell Hakim office, usually 7 days prior to surgery..  __x__ Stop Anti-inflammatories such as Advil, Aleve, Ibuprofen,meloxicam (MOBIC), Motrin, Naproxen, Naprosyn, Goodies powders or aspirin products. OK to  take Tylenol.   _x___ Stop supplements:Cyanocobalamin (B-12),TURMERIC, Zinc    until after surgery.    ____ Bring C-Pap to the hospital.

## 2016-11-24 LAB — URINE CULTURE
CULTURE: NO GROWTH
Special Requests: NORMAL

## 2016-11-25 ENCOUNTER — Inpatient Hospital Stay: Admission: RE | Admit: 2016-11-25 | Payer: BLUE CROSS/BLUE SHIELD | Source: Ambulatory Visit

## 2016-11-27 NOTE — Care Management Note (Signed)
Case Management Note  Patient Details  Name: Donald [name] MRN: [id number] Date of Birth: 1951/04/09  Subjective/Objective:     Notified Donald [name] at [facility] of [name][facility] of discharge home today with Hospice services at his home. New Hospice patient per Donald [name]..Per sister Donald [name] reports that all DME equipment and semi-electric hospital bed were delivered and in place in Donald [name] home. Chronic oxygen is in the home and Donald [name] wheelchair is in the home. Donald [name] transported Donald [name] to his home where he resides with [name] significant other. Donald [name] reports that he has an Aide and that his two sisters assist him at home but do not live there. No other discharge needs identified.              Action/Plan:   Expected Discharge Date:  12/11/16               Expected Discharge Plan:     In-House Referral:     Discharge planning Services     Post Acute Care Choice:    Choice offered to:     DME Arranged:    DME Agency:     HH Arranged:    HH Agency:     Status of Service:     If discussed at [employer], dates discussed:    Additional Comments:  Donald Person A, RN 11/27/2016, 2:46 PM

## 2016-11-29 ENCOUNTER — Encounter
Admission: RE | Admit: 2016-11-29 | Discharge: 2016-11-29 | Disposition: A | Discharge status: Home / Self Care | Payer: BLUE CROSS/BLUE SHIELD | Source: Ambulatory Visit | Attending: Orthopedic Surgery | Admitting: Orthopedic Surgery

## 2016-11-29 DIAGNOSIS — Z01812 Encounter for preprocedural laboratory examination: Secondary | ICD-10-CM | POA: Insufficient documentation

## 2016-11-29 LAB — TYPE AND SCREEN
ABO/RH(D): O POS
Antibody Screen: NEGATIVE

## 2016-11-29 LAB — PROTIME-INR
INR: 0.91
Prothrombin Time: 12.2 seconds (ref 11.4–15.2)

## 2016-11-29 LAB — APTT: aPTT: 28 seconds (ref 24–36)

## 2016-11-29 NOTE — OR Nursing (Signed)
Pt notified of need to come for blood work - pt stated he forgot last week but will come today.

## 2016-12-08 ENCOUNTER — Encounter: Payer: Self-pay | Admitting: Orthopedic Surgery

## 2016-12-08 ENCOUNTER — Inpatient Hospital Stay: Payer: BLUE CROSS/BLUE SHIELD | Admitting: Anesthesiology

## 2016-12-08 ENCOUNTER — Encounter
Admission: RE | Disposition: A | Discharge status: Home Health | Payer: Self-pay | Source: Ambulatory Visit | Attending: Orthopedic Surgery

## 2016-12-08 ENCOUNTER — Inpatient Hospital Stay
Admission: RE | Admit: 2016-12-08 | Discharge: 2016-12-10 | DRG: 470 | Disposition: A | Discharge status: Home Health | Payer: BLUE CROSS/BLUE SHIELD | Source: Ambulatory Visit | Attending: Orthopedic Surgery | Admitting: Orthopedic Surgery

## 2016-12-08 ENCOUNTER — Inpatient Hospital Stay: Payer: BLUE CROSS/BLUE SHIELD

## 2016-12-08 DIAGNOSIS — I10 Essential (primary) hypertension: Secondary | ICD-10-CM | POA: Diagnosis present

## 2016-12-08 DIAGNOSIS — Z79899 Other long term (current) drug therapy: Secondary | ICD-10-CM

## 2016-12-08 DIAGNOSIS — Z96649 Presence of unspecified artificial hip joint: Secondary | ICD-10-CM

## 2016-12-08 DIAGNOSIS — Z7982 Long term (current) use of aspirin: Secondary | ICD-10-CM | POA: Diagnosis not present

## 2016-12-08 DIAGNOSIS — M6281 Muscle weakness (generalized): Secondary | ICD-10-CM

## 2016-12-08 DIAGNOSIS — M1611 Unilateral primary osteoarthritis, right hip: Principal | ICD-10-CM | POA: Diagnosis present

## 2016-12-08 DIAGNOSIS — Z96653 Presence of artificial knee joint, bilateral: Secondary | ICD-10-CM | POA: Diagnosis present

## 2016-12-08 DIAGNOSIS — R262 Difficulty in walking, not elsewhere classified: Secondary | ICD-10-CM

## 2016-12-08 DIAGNOSIS — E785 Hyperlipidemia, unspecified: Secondary | ICD-10-CM | POA: Diagnosis present

## 2016-12-08 HISTORY — PX: TOTAL HIP ARTHROPLASTY: SHX124

## 2016-12-08 LAB — ABO/RH: ABO/RH(D): O POS

## 2016-12-08 IMAGING — DX DG HIP (WITH OR WITHOUT PELVIS) 1V PORT*R*
2 series · 2 of 2 positions shown · non-contrast
Comparison: No recent studies in PACs

CLINICAL DATA: Status post right total hip arthroplasty placement.

EXAM:
DG HIP (WITH OR WITHOUT PELVIS) 1V PORT RIGHT

[pelvis ap]
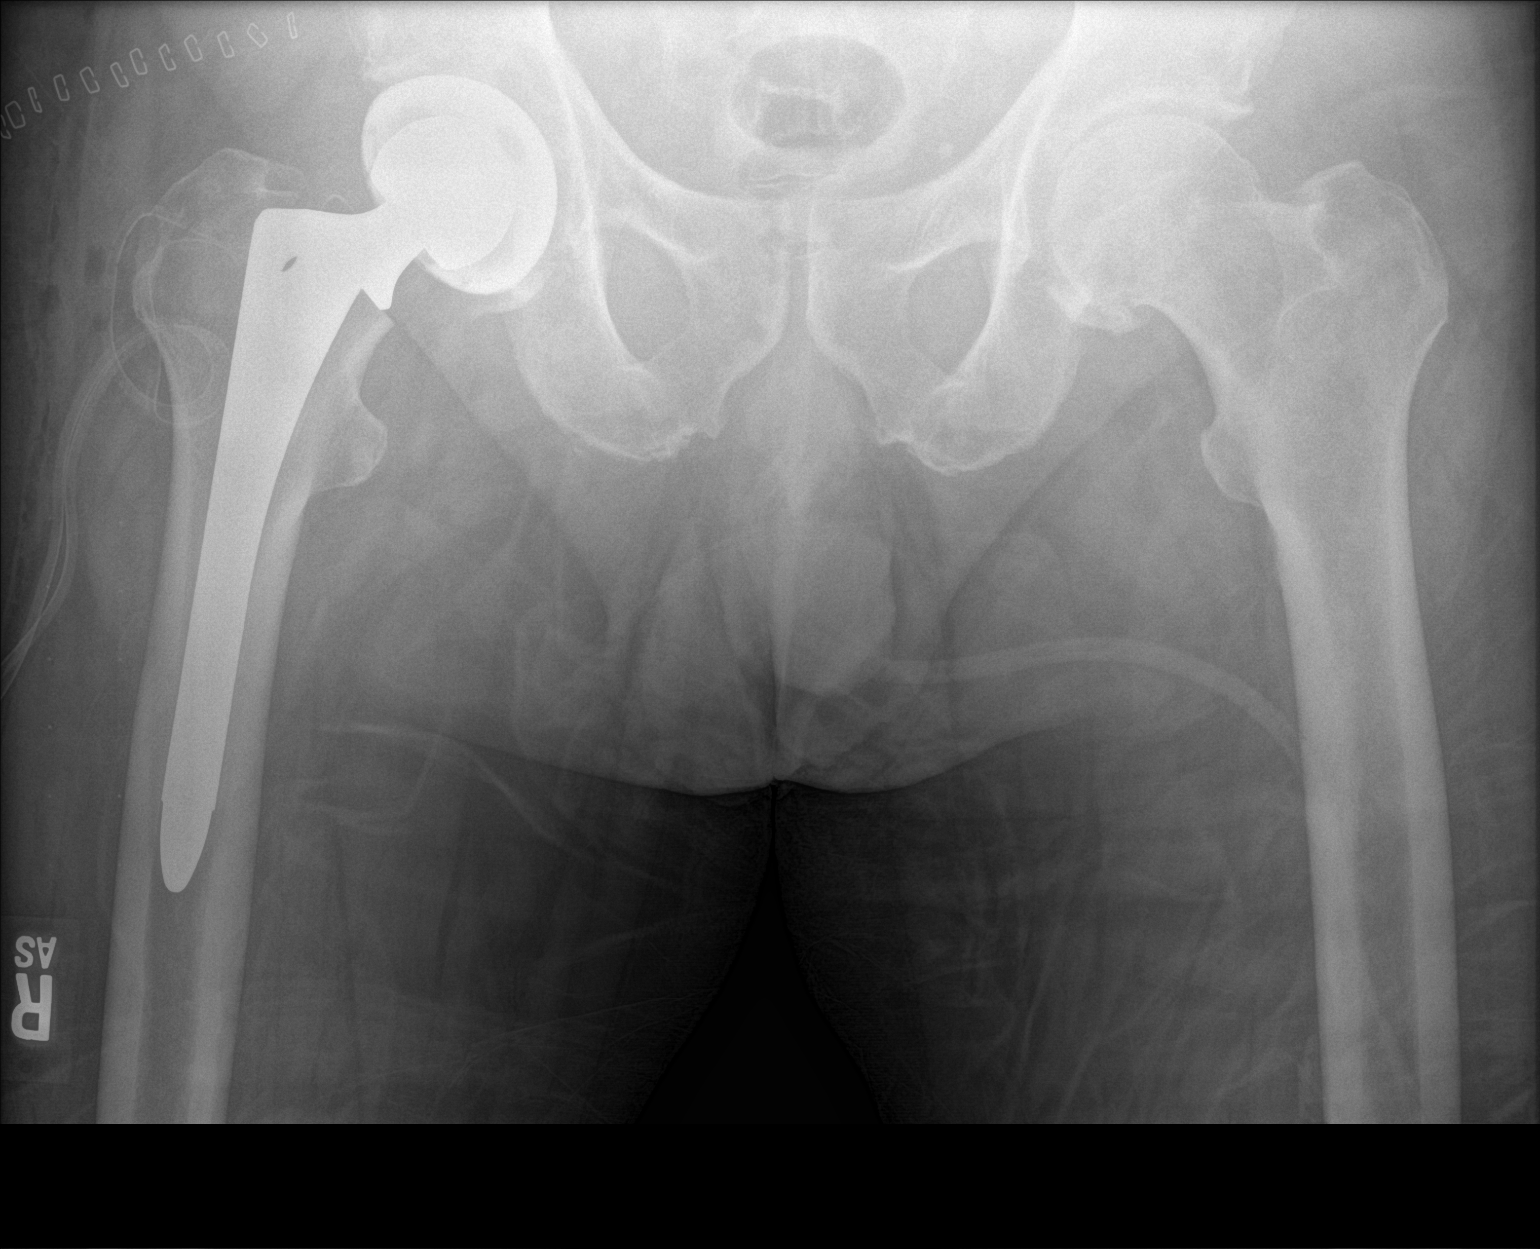

[hip lat]
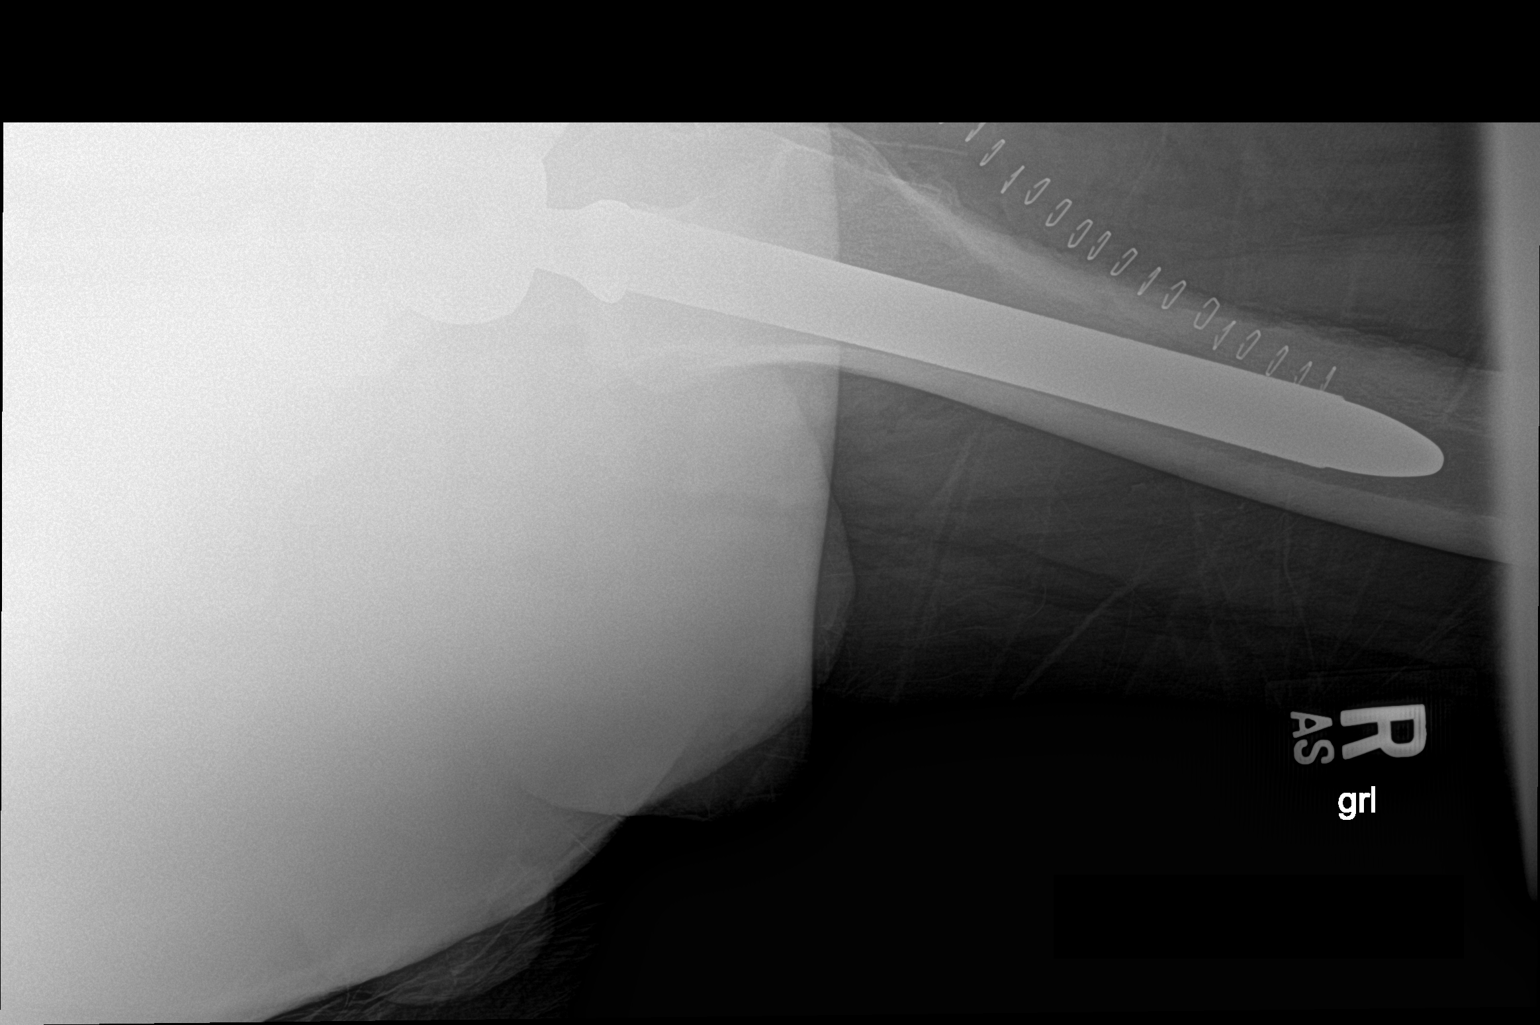

[2 of 2 positions shown; findings below may reference images not displayed]

FINDINGS: AP and cross-table lateral views of the right hip reveal the
presence of a prosthesis. Positioning of the prosthetic components
appears good. The interface of the prosthesis with the native bone
appears normal. No acute native bone fracture is observed. Surgical
skin staples and drain lines are present.
IMPRESSION: There is no immediate postprocedure complication following right
total hip joint prosthesis placement.

## 2016-12-08 SURGERY — ARTHROPLASTY, HIP, TOTAL,POSTERIOR APPROACH
Anesthesia: Spinal | Site: Hip | Laterality: Right | Wound class: Clean

## 2016-12-08 MED ORDER — ALUM & MAG HYDROXIDE-SIMETH 200-200-20 MG/5ML PO SUSP: 30.0000 mL | ORAL | Status: DC | PRN

## 2016-12-08 MED ORDER — NEOMYCIN-POLYMYXIN B GU 40-200000 IR SOLN
Status: DC | PRN
  Administered 2016-12-08: 14 mL

## 2016-12-08 MED ORDER — BISACODYL 10 MG RE SUPP: 10.0000 mg | Freq: Every day | RECTAL | Status: DC | PRN

## 2016-12-08 MED ORDER — TESTOSTERONE 30 MG/ACT TD SOLN: 30.0000 mg | Freq: Every day | TRANSDERMAL | Status: DC

## 2016-12-08 MED ORDER — PROPOFOL 500 MG/50ML IV EMUL
INTRAVENOUS | Status: AC
  Filled 2016-12-08: qty 50

## 2016-12-08 MED ORDER — FENTANYL CITRATE (PF) 100 MCG/2ML IJ SOLN
INTRAMUSCULAR | Status: AC
  Filled 2016-12-08: qty 2

## 2016-12-08 MED ORDER — ONDANSETRON HCL 4 MG/2ML IJ SOLN: 4.0000 mg | Freq: Once | INTRAMUSCULAR | Status: DC | PRN

## 2016-12-08 MED ORDER — CEFAZOLIN SODIUM-DEXTROSE 2-3 GM-% IV SOLR
2.0000 g | Freq: Four times a day (QID) | INTRAVENOUS | Status: AC
  Administered 2016-12-08 – 2016-12-09 (×4): 2 g via INTRAVENOUS
  Filled 2016-12-08 (×4): qty 50

## 2016-12-08 MED ORDER — ACETAMINOPHEN 325 MG PO TABS: 650.0000 mg | ORAL_TABLET | Freq: Four times a day (QID) | ORAL | Status: DC | PRN

## 2016-12-08 MED ORDER — ONDANSETRON HCL 4 MG/2ML IJ SOLN: 4.0000 mg | Freq: Four times a day (QID) | INTRAMUSCULAR | Status: DC | PRN

## 2016-12-08 MED ORDER — TRANEXAMIC ACID 1000 MG/10ML IV SOLN
1000.0000 mg | INTRAVENOUS | Status: DC
  Filled 2016-12-08: qty 10

## 2016-12-08 MED ORDER — MAGNESIUM HYDROXIDE 400 MG/5ML PO SUSP: 30.0000 mL | Freq: Every day | ORAL | Status: DC | PRN

## 2016-12-08 MED ORDER — CEFAZOLIN SODIUM-DEXTROSE 2-4 GM/100ML-% IV SOLN
INTRAVENOUS | Status: AC
  Filled 2016-12-08: qty 100

## 2016-12-08 MED ORDER — ACETAMINOPHEN 650 MG RE SUPP: 650.0000 mg | Freq: Four times a day (QID) | RECTAL | Status: DC | PRN

## 2016-12-08 MED ORDER — CEFAZOLIN SODIUM-DEXTROSE 2-4 GM/100ML-% IV SOLN: 2.0000 g | Freq: Four times a day (QID) | INTRAVENOUS | Status: DC

## 2016-12-08 MED ORDER — ENOXAPARIN SODIUM 30 MG/0.3ML ~~LOC~~ SOLN
30.0000 mg | Freq: Two times a day (BID) | SUBCUTANEOUS | Status: DC
  Administered 2016-12-09 – 2016-12-10 (×3): 30 mg via SUBCUTANEOUS
  Filled 2016-12-08 (×3): qty 0.3

## 2016-12-08 MED ORDER — CHLORHEXIDINE GLUCONATE 4 % EX LIQD: 60.0000 mL | Freq: Once | CUTANEOUS | Status: DC

## 2016-12-08 MED ORDER — VITAMIN B-12 1000 MCG PO TABS
5000.0000 ug | ORAL_TABLET | Freq: Every day | ORAL | Status: DC
  Administered 2016-12-09 – 2016-12-10 (×2): 5000 ug via ORAL
  Filled 2016-12-08 (×2): qty 5

## 2016-12-08 MED ORDER — BUPIVACAINE IN DEXTROSE 0.75-8.25 % IT SOLN
INTRATHECAL | Status: DC | PRN
  Administered 2016-12-08: 1.8 mL via INTRATHECAL

## 2016-12-08 MED ORDER — SILDENAFIL CITRATE 20 MG PO TABS: 20.0000 mg | ORAL_TABLET | Freq: Every day | ORAL | Status: DC

## 2016-12-08 MED ORDER — MIDAZOLAM HCL 5 MG/5ML IJ SOLN
INTRAMUSCULAR | Status: DC | PRN
  Administered 2016-12-08: 2 mg via INTRAVENOUS

## 2016-12-08 MED ORDER — TRANEXAMIC ACID 1000 MG/10ML IV SOLN
INTRAVENOUS | Status: DC | PRN
  Administered 2016-12-08: 1000 mg via INTRAVENOUS

## 2016-12-08 MED ORDER — ADULT MULTIVITAMIN W/MINERALS CH
1.0000 | ORAL_TABLET | Freq: Every day | ORAL | Status: DC
  Administered 2016-12-09 – 2016-12-10 (×2): 1 via ORAL
  Filled 2016-12-08 (×2): qty 1

## 2016-12-08 MED ORDER — FAMOTIDINE 20 MG PO TABS
ORAL_TABLET | ORAL | Status: AC
  Filled 2016-12-08: qty 1

## 2016-12-08 MED ORDER — KETAMINE HCL 10 MG/ML IJ SOLN
INTRAMUSCULAR | Status: DC | PRN
  Administered 2016-12-08: 40 mg via INTRAVENOUS

## 2016-12-08 MED ORDER — TETRACAINE HCL 1 % IJ SOLN
INTRAMUSCULAR | Status: DC | PRN
  Administered 2016-12-08: 4 mg via INTRASPINAL

## 2016-12-08 MED ORDER — PROPOFOL 10 MG/ML IV BOLUS
INTRAVENOUS | Status: AC
  Filled 2016-12-08: qty 20

## 2016-12-08 MED ORDER — TRANEXAMIC ACID 1000 MG/10ML IV SOLN
1000.0000 mg | Freq: Once | INTRAVENOUS | Status: AC
  Administered 2016-12-08: 1000 mg via INTRAVENOUS
  Filled 2016-12-08: qty 10

## 2016-12-08 MED ORDER — TRAMADOL HCL 50 MG PO TABS: 50.0000 mg | ORAL_TABLET | ORAL | Status: DC | PRN

## 2016-12-08 MED ORDER — KETAMINE HCL 10 MG/ML IJ SOLN
INTRAMUSCULAR | Status: AC
  Filled 2016-12-08: qty 1

## 2016-12-08 MED ORDER — PHENYLEPHRINE HCL 10 MG/ML IJ SOLN
INTRAMUSCULAR | Status: AC
  Filled 2016-12-08: qty 1

## 2016-12-08 MED ORDER — CELECOXIB 200 MG PO CAPS
200.0000 mg | ORAL_CAPSULE | Freq: Two times a day (BID) | ORAL | Status: DC
  Administered 2016-12-08 – 2016-12-10 (×5): 200 mg via ORAL
  Filled 2016-12-08 (×5): qty 1

## 2016-12-08 MED ORDER — FLUTICASONE PROPIONATE 50 MCG/ACT NA SUSP
1.0000 | Freq: Two times a day (BID) | NASAL | Status: DC
  Administered 2016-12-08 – 2016-12-10 (×3): 1 via NASAL
  Filled 2016-12-08: qty 16

## 2016-12-08 MED ORDER — PANTOPRAZOLE SODIUM 40 MG PO TBEC
40.0000 mg | DELAYED_RELEASE_TABLET | Freq: Two times a day (BID) | ORAL | Status: DC
  Administered 2016-12-08 – 2016-12-10 (×5): 40 mg via ORAL
  Filled 2016-12-08 (×6): qty 1

## 2016-12-08 MED ORDER — LACTATED RINGERS IV SOLN
INTRAVENOUS | Status: DC
  Administered 2016-12-08: 07:00:00 via INTRAVENOUS

## 2016-12-08 MED ORDER — MENTHOL 3 MG MT LOZG
1.0000 | LOZENGE | OROMUCOSAL | Status: DC | PRN
  Filled 2016-12-08: qty 9

## 2016-12-08 MED ORDER — PROPOFOL 500 MG/50ML IV EMUL
INTRAVENOUS | Status: DC | PRN
  Administered 2016-12-08: 55 ug/kg/min via INTRAVENOUS

## 2016-12-08 MED ORDER — DIPHENHYDRAMINE HCL 12.5 MG/5ML PO ELIX: 12.5000 mg | ORAL_SOLUTION | ORAL | Status: DC | PRN

## 2016-12-08 MED ORDER — PHENOL 1.4 % MT LIQD
1.0000 | OROMUCOSAL | Status: DC | PRN
  Filled 2016-12-08: qty 177

## 2016-12-08 MED ORDER — SENNOSIDES-DOCUSATE SODIUM 8.6-50 MG PO TABS
1.0000 | ORAL_TABLET | Freq: Two times a day (BID) | ORAL | Status: DC
  Administered 2016-12-08 – 2016-12-10 (×5): 1 via ORAL
  Filled 2016-12-08 (×5): qty 1

## 2016-12-08 MED ORDER — ONDANSETRON HCL 4 MG PO TABS: 4.0000 mg | ORAL_TABLET | Freq: Four times a day (QID) | ORAL | Status: DC | PRN

## 2016-12-08 MED ORDER — LIDOCAINE HCL (CARDIAC) 20 MG/ML IV SOLN
INTRAVENOUS | Status: DC | PRN
  Administered 2016-12-08: 60 mg via INTRAVENOUS

## 2016-12-08 MED ORDER — ACETAMINOPHEN 10 MG/ML IV SOLN
INTRAVENOUS | Status: DC | PRN
  Administered 2016-12-08: 1000 mg via INTRAVENOUS

## 2016-12-08 MED ORDER — FENTANYL CITRATE (PF) 100 MCG/2ML IJ SOLN: 25.0000 ug | INTRAMUSCULAR | Status: DC | PRN

## 2016-12-08 MED ORDER — ACETAMINOPHEN 10 MG/ML IV SOLN
1000.0000 mg | Freq: Four times a day (QID) | INTRAVENOUS | Status: AC
  Administered 2016-12-08 – 2016-12-09 (×3): 1000 mg via INTRAVENOUS
  Filled 2016-12-08 (×4): qty 100

## 2016-12-08 MED ORDER — SODIUM CHLORIDE 0.9 % IV SOLN
INTRAVENOUS | Status: DC
  Administered 2016-12-09: 03:00:00 via INTRAVENOUS

## 2016-12-08 MED ORDER — NEOMYCIN-POLYMYXIN B GU 40-200000 IR SOLN
Status: AC
  Filled 2016-12-08: qty 20

## 2016-12-08 MED ORDER — LIDOCAINE HCL (PF) 2 % IJ SOLN
INTRAMUSCULAR | Status: AC
  Filled 2016-12-08: qty 2

## 2016-12-08 MED ORDER — METOCLOPRAMIDE HCL 10 MG PO TABS
10.0000 mg | ORAL_TABLET | Freq: Three times a day (TID) | ORAL | Status: DC
  Administered 2016-12-08 – 2016-12-10 (×5): 10 mg via ORAL
  Filled 2016-12-08 (×5): qty 1

## 2016-12-08 MED ORDER — FENTANYL CITRATE (PF) 100 MCG/2ML IJ SOLN
INTRAMUSCULAR | Status: DC | PRN
  Administered 2016-12-08 (×4): 50 ug via INTRAVENOUS

## 2016-12-08 MED ORDER — ACETAMINOPHEN 10 MG/ML IV SOLN
INTRAVENOUS | Status: AC
  Filled 2016-12-08: qty 100

## 2016-12-08 MED ORDER — OXYCODONE HCL 5 MG PO TABS
5.0000 mg | ORAL_TABLET | ORAL | Status: DC | PRN
  Administered 2016-12-08 (×2): 5 mg via ORAL
  Filled 2016-12-08: qty 2
  Filled 2016-12-08: qty 1

## 2016-12-08 MED ORDER — CEFAZOLIN SODIUM-DEXTROSE 2-4 GM/100ML-% IV SOLN
2.0000 g | INTRAVENOUS | Status: AC
  Administered 2016-12-08: 2 g via INTRAVENOUS

## 2016-12-08 MED ORDER — MIDAZOLAM HCL 2 MG/2ML IJ SOLN
INTRAMUSCULAR | Status: AC
  Filled 2016-12-08: qty 2

## 2016-12-08 MED ORDER — FLEET ENEMA 7-19 GM/118ML RE ENEM: 1.0000 | ENEMA | Freq: Once | RECTAL | Status: DC | PRN

## 2016-12-08 MED ORDER — FAMOTIDINE 20 MG PO TABS
20.0000 mg | ORAL_TABLET | Freq: Once | ORAL | Status: AC
  Administered 2016-12-08: 20 mg via ORAL

## 2016-12-08 MED ORDER — MORPHINE SULFATE (PF) 2 MG/ML IV SOLN: 2.0000 mg | INTRAVENOUS | Status: DC | PRN

## 2016-12-08 MED ORDER — FERROUS SULFATE 325 (65 FE) MG PO TABS
325.0000 mg | ORAL_TABLET | Freq: Two times a day (BID) | ORAL | Status: DC
  Administered 2016-12-08 – 2016-12-10 (×3): 325 mg via ORAL
  Filled 2016-12-08 (×3): qty 1

## 2016-12-08 SURGICAL SUPPLY — 55 items
BLADE CLIPPER SURG (BLADE) ×2 IMPLANT
BLADE DRUM FLTD (BLADE) ×2 IMPLANT
BLADE SAW 1 (BLADE) ×2 IMPLANT
CANISTER SUCT 1200ML W/VALVE (MISCELLANEOUS) ×2 IMPLANT
CANISTER SUCT 3000ML (MISCELLANEOUS) ×4 IMPLANT
CAPT HIP TOTAL 2 ×2 IMPLANT
CARTRIDGE OIL MAESTRO DRILL (MISCELLANEOUS) ×1 IMPLANT
CATH FOL LEG HOLDER (MISCELLANEOUS) ×2 IMPLANT
CATH TRAY METER 16FR LF (MISCELLANEOUS) ×2 IMPLANT
DIFFUSER MAESTRO (MISCELLANEOUS) ×2 IMPLANT
DRAPE INCISE IOBAN 66X60 STRL (DRAPES) ×2 IMPLANT
DRAPE SHEET LG 3/4 BI-LAMINATE (DRAPES) ×2 IMPLANT
DRSG DERMACEA 8X12 NADH (GAUZE/BANDAGES/DRESSINGS) ×2 IMPLANT
DRSG OPSITE POSTOP 3X4 (GAUZE/BANDAGES/DRESSINGS) IMPLANT
DRSG OPSITE POSTOP 4X12 (GAUZE/BANDAGES/DRESSINGS) IMPLANT
DRSG OPSITE POSTOP 4X14 (GAUZE/BANDAGES/DRESSINGS) ×2 IMPLANT
DRSG TEGADERM 2-3/8X2-3/4 SM (GAUZE/BANDAGES/DRESSINGS) ×2 IMPLANT
DRSG TEGADERM 4X4.75 (GAUZE/BANDAGES/DRESSINGS) ×2 IMPLANT
DURAPREP 26ML APPLICATOR (WOUND CARE) ×2 IMPLANT
ELECT BLADE 6.5 EXT (BLADE) ×2 IMPLANT
ELECT CAUTERY BLADE 6.4 (BLADE) ×2 IMPLANT
GLOVE BIO SURGEON STRL SZ7 (GLOVE) ×4 IMPLANT
GLOVE BIOGEL M STRL SZ7.5 (GLOVE) ×4 IMPLANT
GLOVE INDICATOR 7.5 STRL GRN (GLOVE) ×4 IMPLANT
GLOVE INDICATOR 8.0 STRL GRN (GLOVE) ×4 IMPLANT
GLOVE SURG 9.0 ORTHO LTXF (GLOVE) ×4 IMPLANT
GLOVE SURG ORTHO 9.0 STRL STRW (GLOVE) ×4 IMPLANT
GOWN STRL REUS W/ TWL LRG LVL3 (GOWN DISPOSABLE) ×3 IMPLANT
GOWN STRL REUS W/TWL 2XL LVL3 (GOWN DISPOSABLE) ×2 IMPLANT
GOWN STRL REUS W/TWL LRG LVL3 (GOWN DISPOSABLE) ×3
HANDPIECE INTERPULSE COAX TIP (DISPOSABLE) ×1
HEMOVAC 400CC 10FR (MISCELLANEOUS) ×2 IMPLANT
HOOD PEEL AWAY FLYTE STAYCOOL (MISCELLANEOUS) ×4 IMPLANT
KIT RM TURNOVER STRD PROC AR (KITS) ×2 IMPLANT
NDL SAFETY 18GX1.5 (NEEDLE) ×2 IMPLANT
NS IRRIG 500ML POUR BTL (IV SOLUTION) ×2 IMPLANT
OIL CARTRIDGE MAESTRO DRILL (MISCELLANEOUS) ×2
PACK HIP PROSTHESIS (MISCELLANEOUS) ×2 IMPLANT
PIN STEIN THRED 5/32 (Pin) ×2 IMPLANT
SET HNDPC FAN SPRY TIP SCT (DISPOSABLE) ×1 IMPLANT
SOL .9 NS 3000ML IRR  AL (IV SOLUTION) ×1
SOL .9 NS 3000ML IRR UROMATIC (IV SOLUTION) ×1 IMPLANT
SOL PREP PVP 2OZ (MISCELLANEOUS) ×2
SOLUTION PREP PVP 2OZ (MISCELLANEOUS) ×1 IMPLANT
SPONGE DRAIN TRACH 4X4 STRL 2S (GAUZE/BANDAGES/DRESSINGS) ×2 IMPLANT
STAPLER SKIN PROX 35W (STAPLE) ×2 IMPLANT
SUT ETHIBOND #5 BRAIDED 30INL (SUTURE) ×2 IMPLANT
SUT VIC AB 0 CT1 36 (SUTURE) ×2 IMPLANT
SUT VIC AB 1 CT1 36 (SUTURE) ×4 IMPLANT
SUT VIC AB 2-0 CT1 27 (SUTURE) ×1
SUT VIC AB 2-0 CT1 TAPERPNT 27 (SUTURE) ×1 IMPLANT
SYR 20CC LL (SYRINGE) ×2 IMPLANT
TAPE ADH 3 LX (MISCELLANEOUS) ×2 IMPLANT
TAPE TRANSPORE STRL 2 31045 (GAUZE/BANDAGES/DRESSINGS) ×2 IMPLANT
TOWEL OR 17X26 4PK STRL BLUE (TOWEL DISPOSABLE) ×2 IMPLANT

## 2016-12-08 NOTE — H&P (Signed)
The patient has been re-examined, and the chart reviewed, and there have been no interval changes to the documented history and physical.    The risks, benefits, and alternatives have been discussed at length. The patient expressed understanding of the risks benefits and agreed with plans for surgical intervention.  Donald [name], Jr. M.D.    

## 2016-12-08 NOTE — Anesthesia Post-op Follow-up Note (Cosign Needed)
Anesthesia QCDR form completed.        

## 2016-12-08 NOTE — NC FL2 (Signed)
[place] LEVEL OF CARE SCREENING TOOL     IDENTIFICATION  Patient Name: Donald [name] Birthdate: [birth year] Sex: male Admission Date (Current Location): 12/08/2016  [place] and [place] Number:  Engineering geologist and Address:  [facility], [address] [place], [place] [address]      Provider Number: [id number]  Attending Physician Name and Address:  Dereck Leep, MD  Relative Name and Phone Number:       Current Level of Care: Hospital Recommended Level of Care: [place] Prior Approval Number:    Date Approved/Denied:   PASRR Number:  ([id number])  Discharge Plan: SNF    Current Diagnoses: Patient Active Problem List   Diagnosis Date Noted  . S/P total hip arthroplasty 12/08/2016  . Primary osteoarthritis of right hip 05/11/2016  . Spondylosis of lumbar region without myelopathy or radiculopathy 05/11/2016  . Status post total left knee replacement 11/17/2015  . Status post total right knee replacement 11/17/2015  . Bilateral leg edema 01/20/2015  . Chronic low back pain 01/20/2015  . Hyperglycemia, unspecified 04/04/2014  . Obesity (BMI 30-39.9) 04/04/2014    Orientation RESPIRATION BLADDER Height & Weight     Self, Time, Situation, Place  Normal Continent Weight:   Height:     BEHAVIORAL SYMPTOMS/MOOD NEUROLOGICAL BOWEL NUTRITION STATUS   (none)  (none) Continent Diet (Diet: Clear Liquid )  AMBULATORY STATUS COMMUNICATION OF NEEDS Skin   Extensive Assist Verbally Surgical wounds (Incision: Right Hip. )                       Personal Care Assistance Level of Assistance  Bathing, Feeding, Dressing Bathing Assistance: Limited assistance Feeding assistance: Independent Dressing Assistance: Limited assistance     Functional Limitations Info  Sight, Hearing, Speech Sight Info: Adequate Hearing Info: Adequate Speech Info: Adequate    SPECIAL CARE FACTORS FREQUENCY  PT (By licensed  PT), OT (By licensed OT)     PT Frequency:  (5) OT Frequency:  (5)            Contractures      Additional Factors Info  Code Status, Allergies Code Status Info:  (Full Code. ) Allergies Info:  (No Known Allergies. )           Current Medications (12/08/2016):  This is the current hospital active medication list Current Facility-Administered Medications  Medication Dose Route Frequency Provider Last Rate Last Dose  . 0.9 %  sodium chloride infusion   Intravenous Continuous Dereck Leep, MD      . acetaminophen (OFIRMEV) IV 1,000 mg  1,000 mg Intravenous Q6H Dereck Leep, MD      . acetaminophen (TYLENOL) tablet 650 mg  650 mg Oral Q6H PRN Dereck Leep, MD       Or  . acetaminophen (TYLENOL) suppository 650 mg  650 mg Rectal Q6H PRN Dereck Leep, MD      . alum & mag hydroxide-simeth (MAALOX/MYLANTA) 200-200-20 MG/5ML suspension 30 mL  30 mL Oral Q4H PRN Dereck Leep, MD      . bisacodyl (DULCOLAX) suppository 10 mg  10 mg Rectal Daily PRN Dereck Leep, MD      . ceFAZolin (ANCEF) IVPB 2 g/50 mL premix  2 g Intravenous Q6H Dereck Leep, MD      . celecoxib (CELEBREX) capsule 200 mg  200 mg Oral Q12H Dereck Leep, MD      .  diphenhydrAMINE (BENADRYL) 12.5 MG/5ML elixir 12.5-25 mg  12.5-25 mg Oral Q4H PRN Dereck Leep, MD      . Derrill Memo ON 12/09/2016] enoxaparin (LOVENOX) injection 30 mg  30 mg Subcutaneous Q12H Dereck Leep, MD      . famotidine (PEPCID) 20 MG tablet           . ferrous sulfate tablet 325 mg  325 mg Oral BID WC Dereck Leep, MD      . fluticasone (FLONASE) 50 MCG/ACT nasal spray 1 spray  1 spray Each Nare BID Dereck Leep, MD      . magnesium hydroxide (MILK OF MAGNESIA) suspension 30 mL  30 mL Oral Daily PRN Dereck Leep, MD      . menthol-cetylpyridinium (CEPACOL) lozenge 3 mg  1 lozenge Oral PRN Dereck Leep, MD       Or  . phenol (CHLORASEPTIC) mouth spray 1 spray  1 spray Mouth/Throat PRN Dereck Leep, MD      . metoCLOPramide  (REGLAN) tablet 10 mg  10 mg Oral TID AC & HS Dereck Leep, MD      . morphine 2 MG/ML injection 2 mg  2 mg Intravenous Q2H PRN Dereck Leep, MD      . multivitamin with minerals tablet 1 tablet  1 tablet Oral Daily Dereck Leep, MD      . ondansetron (ZOFRAN) tablet 4 mg  4 mg Oral Q6H PRN Dereck Leep, MD       Or  . ondansetron (ZOFRAN) injection 4 mg  4 mg Intravenous Q6H PRN Dereck Leep, MD      . oxyCODONE (Oxy IR/ROXICODONE) immediate release tablet 5-10 mg  5-10 mg Oral Q4H PRN Dereck Leep, MD      . pantoprazole (PROTONIX) EC tablet 40 mg  40 mg Oral BID Dereck Leep, MD      . senna-docusate (Senokot-S) tablet 1 tablet  1 tablet Oral BID Dereck Leep, MD      . sodium phosphate (FLEET) 7-19 GM/118ML enema 1 enema  1 enema Rectal Once PRN Dereck Leep, MD      . Testosterone SOLN 30 mg  30 mg Transdermal Daily Dereck Leep, MD      . traMADol Veatrice Bourbon) tablet 50-100 mg  50-100 mg Oral Q4H PRN Dereck Leep, MD      . vitamin B-12 (CYANOCOBALAMIN) tablet 5,000 mcg  5,000 mcg Oral Daily Dereck Leep, MD         Discharge Medications: Please see discharge summary for a list of discharge medications.  Relevant Imaging Results:  Relevant Lab Results:   Additional Information  (SSN: [id number])  Savan Ruta, Veronia Beets, LCSW

## 2016-12-08 NOTE — Brief Op Note (Signed)
12/08/2016  12:27 PM  PATIENT:  Donald [name]  66 y.o. male  PRE-OPERATIVE DIAGNOSIS:  PRIMARY OSTEOARTHRITIS OF RIGHT HIP  POST-OPERATIVE DIAGNOSIS:  PRIMARY OSTEOARTHRITIS OF RIGHT HIP  PROCEDURE:  Procedure(s): TOTAL HIP ARTHROPLASTY (Right)  SURGEON:  Surgeon(s) and Role:    * Dereck Leep, MD - Primary  ASSISTANTS: Vance Peper, PA   ANESTHESIA:   spinal  EBL: 900 ml  Fluids replaced: 700 mL of crystalloid  BLOOD ADMINISTERED:none  DRAINS: 2 medium Hemovac drains   LOCAL MEDICATIONS USED:  NONE  SPECIMEN:  Source of Specimen:  Right femoral head  DISPOSITION OF SPECIMEN:  PATHOLOGY  COUNTS:  YES  TOURNIQUET:  * No tourniquets in log *  DICTATION: .Dragon Dictation  PLAN OF CARE: Admit to inpatient   PATIENT DISPOSITION:  PACU - hemodynamically stable.   Delay start of Pharmacological VTE agent (>24hrs) due to surgical blood loss or risk of bleeding: yes

## 2016-12-08 NOTE — Anesthesia Procedure Notes (Signed)
Performed by: Lance Muss Pre-anesthesia Checklist: Patient identified, Emergency Drugs available, Patient being monitored, Timeout performed and Suction available Patient Re-evaluated:Patient Re-evaluated prior to inductionOxygen Delivery Method: Simple face mask Intubation Type: IV induction

## 2016-12-08 NOTE — Anesthesia Preprocedure Evaluation (Signed)
Anesthesia Evaluation  Patient identified by MRN, date of birth, ID band Patient awake    Reviewed: Allergy & Precautions, NPO status , Patient's Chart, lab work & pertinent test results  History of Anesthesia Complications (+) PONVNegative for: history of anesthetic complications  Airway Mallampati: II  TM Distance: >3 FB Neck ROM: Full    Dental  (+) Poor Dentition   Pulmonary neg pulmonary ROS, neg sleep apnea, neg COPD,    breath sounds clear to auscultation- rhonchi (-) wheezing      Cardiovascular Exercise Tolerance: Good hypertension, Pt. on medications (-) CAD and (-) Past MI  Rhythm:Regular Rate:Normal - Systolic murmurs and - Diastolic murmurs    Neuro/Psych negative neurological ROS  negative psych ROS   GI/Hepatic negative GI ROS, Neg liver ROS,   Endo/Other  negative endocrine ROSneg diabetes  Renal/GU negative Renal ROS  negative genitourinary   Musculoskeletal  (+) Arthritis , Osteoarthritis,    Abdominal (+) - obese,   Peds negative pediatric ROS (+)  Hematology negative hematology ROS (+)   Anesthesia Other Findings Past Medical History: No date: Arthritis No date: Hyperlipidemia No date: Hypertension   Reproductive/Obstetrics                             Anesthesia Physical  Anesthesia Plan  ASA: II  Anesthesia Plan: Spinal   Post-op Pain Management:    Induction:   Airway Management Planned: Nasal Cannula  Additional Equipment:   Intra-op Plan:   Post-operative Plan:   Informed Consent: I have reviewed the patients History and Physical, chart, labs and discussed the procedure including the risks, benefits and alternatives for the proposed anesthesia with the patient or authorized representative who has indicated his/her understanding and acceptance.   Dental advisory given  Plan Discussed with: CRNA and Anesthesiologist  Anesthesia Plan Comments:          Anesthesia Quick Evaluation

## 2016-12-08 NOTE — Evaluation (Signed)
Physical Therapy Evaluation Patient Details Name: Donald [name] MRN: [id number] DOB: Jan 21, 1951 Today's Date: 12/08/2016   History of Present Illness  Patient is a 66 y/o male that presents with R THR on 12/08/2016.   Clinical Impression  Patient reports he has been independent with mobility and has a history of bilateral knee replacements. He does not require assistance with bed mobility or transfer on this date, he is able to ambulate but is limited in distance by pain in R hip and R side of lower back (chronic). He is able to participate in all there-ex without complaint. He appears to be progressing well with mobility at this time and is more appropriate for home health PT once he is medically appropriate for discharge.     Follow Up Recommendations Home health PT    Equipment Recommendations  Rolling walker with 5" wheels    Recommendations for Other Services       Precautions / Restrictions Precautions Precautions: Posterior Hip;Fall Restrictions Weight Bearing Restrictions: Yes RLE Weight Bearing: Weight bearing as tolerated      Mobility  Bed Mobility Overal bed mobility: Needs Assistance Bed Mobility: Supine to Sit     Supine to sit: Supervision;HOB elevated;Min guard     General bed mobility comments: patient requires guidance for RLE when dangling off the edge of the bed, able to use his UEs to bring his torso upright.   Transfers Overall transfer level: Needs assistance Equipment used: Rolling walker (2 wheeled) Transfers: Sit to/from Stand Sit to Stand: Min guard         General transfer comment: Patient requires no assistance or cuing to complete transfer   Ambulation/Gait Ambulation/Gait assistance: Supervision;Min guard Ambulation Distance (Feet): 10 Feet Assistive device: Rolling walker (2 wheeled) Gait Pattern/deviations: WFL(Within Functional Limits)   Gait velocity interpretation: <1.8 ft/sec, indicative of risk for recurrent falls General  Gait Details: Patient demonstrates mild antalgic gait pattern, appropriate step lengths. Reports he is having a good deal of pain during ambulation, has chronic low back pain  Stairs            Wheelchair Mobility    Modified Rankin (Stroke Patients Only)       Balance Overall balance assessment: Needs assistance Sitting-balance support: No upper extremity supported Sitting balance-Leahy Scale: Normal     Standing balance support: Bilateral upper extremity supported Standing balance-Leahy Scale: Good                               Pertinent Vitals/Pain Pain Assessment: 0-10 Pain Location: Patient reports pain increases with weight bearing, more tolerable once in the chair.  Pain Descriptors / Indicators: Aching;Operative site guarding;Sore Pain Intervention(s): Limited activity within patient's tolerance;Monitored during session    [place] expects to be discharged to:: Private residence Living Arrangements: Spouse/significant other Available Help at Discharge: Family;Available 24 hours/day Type of Home: House Home Access: Stairs to enter Entrance Stairs-Rails:  (Did not specify) Entrance Stairs-Number of Steps: 3-4          Prior Function Level of Independence: Independent         Comments: Patient was independent prior to this admission, has had previous knee replacement surgeries.      Hand Dominance        Extremity/Trunk Assessment   Upper Extremity Assessment Upper Extremity Assessment: Overall WFL for tasks assessed    Lower Extremity Assessment Lower Extremity Assessment: Overall WFL for tasks assessed;RLE  deficits/detail RLE Deficits / Details: Mild deficit in hip flexor/knee extensor strength       Communication   Communication: No difficulties  Cognition Arousal/Alertness: Awake/alert Behavior During Therapy: WFL for tasks assessed/performed Overall Cognitive Status: Within Functional Limits for tasks  assessed                      General Comments General comments (skin integrity, edema, etc.): Tubes, drains in place    Exercises Total Joint Exercises Heel Slides: AROM;Both;10 reps;AAROM Hip ABduction/ADduction: AROM;Both;10 reps;AAROM Long Arc Quad: AROM;Both;10 reps;AAROM   Assessment/Plan    PT Assessment Patient needs continued PT services  PT Problem List Decreased strength;Decreased mobility;Decreased range of motion;Decreased activity tolerance;Decreased balance;Decreased knowledge of use of DME;Pain;Cardiopulmonary status limiting activity;Decreased knowledge of precautions          PT Treatment Interventions DME instruction;Therapeutic activities;Therapeutic exercise;Gait training;Stair training;Balance training;Manual techniques;Neuromuscular re-education;Patient/family education    PT Goals (Current goals can be found in the Care Plan section)  Acute Rehab PT Goals Patient Stated Goal: To return to home mobility and play golf again  PT Goal Formulation: With patient/family Time For Goal Achievement: 12/22/16 Potential to Achieve Goals: Good    Frequency BID   Barriers to discharge        Co-evaluation               End of Session Equipment Utilized During Treatment: Gait belt Activity Tolerance: Patient tolerated treatment well Patient left: with chair alarm set;in chair;with call bell/phone within reach;with family/visitor present Nurse Communication: Mobility status         Time: 1500-1526 PT Time Calculation (min) (ACUTE ONLY): 26 min   Charges:   PT Evaluation $PT Eval Moderate Complexity: 1 Procedure PT Treatments $Therapeutic Exercise: 8-22 mins   PT G Codes:       Royce Macadamia PT, DPT, CSCS    12/08/2016, 3:52 PM

## 2016-12-08 NOTE — Progress Notes (Addendum)
Patient is admitted to room 141 from surgery vai room bed and surgery staff. cyro cuff, drain, TEDs, and foor pumps in place. A&O x4. No c/o pain. Bed alarm on for safety. Discussed POC and orders.

## 2016-12-08 NOTE — Transfer of Care (Signed)
Immediate Anesthesia Transfer of Care Note  Patient: Donald [name]  Procedure(s) Performed: Procedure(s): TOTAL HIP ARTHROPLASTY (Right)  Patient Location: PACU  Anesthesia Type:Spinal  Level of Consciousness: sedated and responds to stimulation  Airway & Oxygen Therapy: Patient Spontanous Breathing and Patient connected to face mask oxygen  Post-op Assessment: Report given to RN and Post -op Vital signs reviewed and stable  Post vital signs: Reviewed and stable  Last Vitals:  Vitals:   12/08/16 0624 12/08/16 1216  BP: (!) 155/79 124/81  Pulse: 83 74  Resp: 16 14  Temp: 36.3 C     Last Pain:  Vitals:   12/08/16 0624  TempSrc: Oral  PainSc: 4          Complications: No apparent anesthesia complications

## 2016-12-08 NOTE — Anesthesia Procedure Notes (Signed)
Spinal  Patient location during procedure: OR Staffing Anesthesiologist: Alvin Critchley Resident/CRNA: Lance Muss Performed: resident/CRNA  Preanesthetic Checklist Completed: patient identified, site marked, surgical consent, pre-op evaluation, timeout performed, IV checked, risks and benefits discussed and monitors and equipment checked Spinal Block Patient position: sitting Prep: Betadine Patient monitoring: heart rate, continuous pulse ox and blood pressure Approach: midline Location: L3-4 Injection technique: single-shot Needle Needle type: Pencil-Tip  Needle gauge: 24 G Needle length: 10 cm Assessment Sensory level: T4

## 2016-12-08 NOTE — Op Note (Signed)
OPERATIVE NOTE  DATE OF SURGERY:  12/08/2016  PATIENT NAME:  Donald [name]   DOB: 1951/03/28  MRN: [id number]  PRE-OPERATIVE DIAGNOSIS: Degenerative arthrosis of the right hip, primary  POST-OPERATIVE DIAGNOSIS:  Same  PROCEDURE:  Right total hip arthroplasty  SURGEON:  Marciano Sequin. M.D.  ASSISTANT:  Vance Peper, PA (present and scrubbed throughout the case, critical for assistance with exposure, retraction, instrumentation, and closure)  ANESTHESIA: spinal  ESTIMATED BLOOD LOSS: 900 mL  FLUIDS REPLACED:  700 mL of crystalloid  DRAINS: 2 medium drains to a Hemovac reservoir  IMPLANTS UTILIZED: DePuy 13.5 mm small stature AML femoral stem, 56 mm OD Pinnacle 100 acetabular component, neutral Pinnacle Marathon polyethylene insert, and a 36 mm M-SPEC -2 mm hip ball  INDICATIONS FOR SURGERY: Donald [name] is a 66 y.o. year old male with a long history of progressive hip and groin  pain. X-rays demonstrated severe degenerative changes. The patient had not seen any significant improvement despite conservative nonsurgical intervention. After discussion of the risks and benefits of surgical intervention, the patient expressed understanding of the risks benefits and agree with plans for total hip arthroplasty.   The risks, benefits, and alternatives were discussed at length including but not limited to the risks of infection, bleeding, nerve injury, stiffness, blood clots, the need for revision surgery, limb length inequality, dislocation, cardiopulmonary complications, among others, and they were willing to proceed.  PROCEDURE IN DETAIL: The patient was brought into the operating room and, after adequate spinal anesthesia was achieved, the patient was placed in a left lateral decubitus position. Axillary roll was placed and all bony prominences were well-padded. The patient's right hip was cleaned and prepped with alcohol and DuraPrep and draped in the usual sterile fashion. A "timeout"  was performed as per usual protocol. A lateral curvilinear incision was made gently curving towards the posterior superior iliac spine. The IT band was incised in line with the skin incision and the fibers of the gluteus maximus were split in line. The piriformis tendon was identified, skeletonized, and incised at its insertion to the proximal femur and reflected posteriorly. A T type posterior capsulotomy was performed. Prior to dislocation of the femoral head, a threaded Steinmann pin was inserted through a separate stab incision into the pelvis superior to the acetabulum and bent in the form of a stylus so as to assess limb length and hip offset throughout the procedure. The femoral head was then dislocated posteriorly. Inspection of the femoral head demonstrated severe degenerative changes with full-thickness loss of articular cartilage. The femoral neck cut was performed using an oscillating saw. The anterior capsule was elevated off of the femoral neck using a periosteal elevator. Attention was then directed to the acetabulum. The remnant of the labrum was excised using electrocautery. Inspection of the acetabulum also demonstrated significant degenerative changes. The acetabulum was reamed in sequential fashion up to a 55 mm diameter. Good punctate bleeding bone was encountered. A 56 mm Pinnacle 100 acetabular component was positioned and impacted into place. Good scratch fit was appreciated. A neutral polyethylene trial was inserted.  Attention was then directed to the proximal femur. A hole for reaming of the proximal femoral canal was created using a high-speed burr. The femoral canal was reamed in sequential fashion up to a 13 mm diameter. This allowed for approximately 6 cm of scratch fit. It was thus elected to ream up to a 13.5 mm diameter to allow for a line to line fit. Serial broaches  were inserted up to a 13.5 mm small stature femoral broach. Calcar region was planed and a trial reduction was  performed using a 36 mm hip ball with a -2 mm neck length. Good equalization of limb lengths and hip offset was appreciated and excellent stability was noted both anteriorly and posteriorly. Trial components were removed. The acetabular shell was irrigated with copious amounts of normal saline with antibiotic solution and suctioned dry. A neutral Pinnacle Marathon polyethylene insert was positioned and impacted into place. Next, a 13.5 mm small stature AML femoral stem was positioned and impacted into place. Excellent scratch fit was appreciated. A trial reduction was again performed with a 36 mm hip ball with a -2 mm neck length. Again, good equalization of limb lengths was appreciated and excellent stability appreciated both anteriorly and posteriorly. The hip was then dislocated and the trial hip ball was removed. The Morse taper was cleaned and dried. A 36 mm M-SPEC hip ball with a -2 mm neck length was placed on the trunnion and impacted into place. The hip was then reduced and placed through range of motion. Excellent stability was appreciated both anteriorly and posteriorly.  The wound was irrigated with copious amounts of normal saline with antibiotic solution and suctioned dry. Good hemostasis was appreciated. The posterior capsulotomy was repaired using #5 Ethibond. Piriformis tendon was reapproximated to the undersurface of the gluteus medius tendon using #5 Ethibond. Two medium drains were placed in the wound bed and brought out through separate stab incisions to be attached to a Hemovac reservoir. The IT band was reapproximated using interrupted sutures of #1 Vicryl. Subcutaneous tissue was approximated using first #0 Vicryl followed by #2-0 Vicryl. The skin was closed with skin staples.  The patient tolerated the procedure well and was transported to the recovery room in stable condition.   Marciano Sequin., M.D. -

## 2016-12-09 LAB — URINALYSIS, ROUTINE W REFLEX MICROSCOPIC
BACTERIA UA: NONE SEEN
BILIRUBIN URINE: NEGATIVE
Glucose, UA: NEGATIVE mg/dL
KETONES UR: NEGATIVE mg/dL
Nitrite: NEGATIVE
PROTEIN: NEGATIVE mg/dL
SPECIFIC GRAVITY, URINE: 1.008 (ref 1.005–1.030)
SQUAMOUS EPITHELIAL / LPF: NONE SEEN
pH: 6 (ref 5.0–8.0)

## 2016-12-09 LAB — CBC
HEMATOCRIT: 38.5 % — AB (ref 40.0–52.0)
Hemoglobin: 13.8 g/dL (ref 13.0–18.0)
MCH: 32.5 pg (ref 26.0–34.0)
MCHC: 35.9 g/dL (ref 32.0–36.0)
MCV: 90.5 fL (ref 80.0–100.0)
Platelets: 214 10*3/uL (ref 150–440)
RBC: 4.25 MIL/uL — ABNORMAL LOW (ref 4.40–5.90)
RDW: 14 % (ref 11.5–14.5)
WBC: 11 10*3/uL — AB (ref 3.8–10.6)

## 2016-12-09 LAB — BASIC METABOLIC PANEL
ANION GAP: 4 — AB (ref 5–15)
BUN: 14 mg/dL (ref 6–20)
CHLORIDE: 108 mmol/L (ref 101–111)
CO2: 24 mmol/L (ref 22–32)
Calcium: 7.9 mg/dL — ABNORMAL LOW (ref 8.9–10.3)
Creatinine, Ser: 1.28 mg/dL — ABNORMAL HIGH (ref 0.61–1.24)
GFR calc Af Amer: >60 mL/min (ref >60)
GFR calc non Af Amer: 56 mL/min — ABNORMAL LOW (ref >60)
GLUCOSE: 137 mg/dL — AB (ref 65–99)
Potassium: 4.1 mmol/L (ref 3.5–5.1)
Sodium: 136 mmol/L (ref 135–145)

## 2016-12-09 MED ORDER — HYDROCODONE-ACETAMINOPHEN 5-325 MG PO TABS
1.0000 | ORAL_TABLET | ORAL | Status: DC | PRN
  Administered 2016-12-09 – 2016-12-10 (×2): 1 via ORAL
  Filled 2016-12-09 (×3): qty 1

## 2016-12-09 MED ORDER — TRAMADOL HCL 50 MG PO TABS: 50.0000 mg | ORAL_TABLET | ORAL | 1 refills | Status: DC | PRN

## 2016-12-09 MED ORDER — ENOXAPARIN SODIUM 40 MG/0.4ML ~~LOC~~ SOLN: 40.0000 mg | SUBCUTANEOUS | 0 refills | Status: DC

## 2016-12-09 MED ORDER — OXYCODONE HCL 5 MG PO TABS: 5.0000 mg | ORAL_TABLET | ORAL | 0 refills | Status: DC | PRN

## 2016-12-09 NOTE — Anesthesia Postprocedure Evaluation (Signed)
Anesthesia Post Note  Patient: Donald [name]  Procedure(s) Performed: Procedure(s) (LRB): TOTAL HIP ARTHROPLASTY (Right)  Patient location during evaluation: Nursing Unit Anesthesia Type: Spinal Level of consciousness: awake and alert Pain management: pain level controlled Vital Signs Assessment: post-procedure vital signs reviewed and stable Respiratory status: spontaneous breathing Cardiovascular status: stable Postop Assessment: no signs of nausea or vomiting and adequate PO intake Anesthetic complications: no     Last Vitals:  Vitals:   12/09/16 0005 12/09/16 0406  BP: (!) 142/60 (!) 116/54  Pulse: 89 80  Resp: 18 18  Temp: 36.8 C 36.7 C    Last Pain:  Vitals:   12/09/16 0406  TempSrc: Oral  PainSc:                  Estill Batten

## 2016-12-09 NOTE — Progress Notes (Signed)
Physical Therapy Treatment Patient Details Name: Donald [name] MRN: [id number] DOB: [birth year] Today's Date: 12/09/2016    History of Present Illness Patient is a 66 y/o male that presents with R THR on 12/08/2016. PMHx includes arthritis, HLD, HTN, chronic lumbar back pain    PT Comments    Pt agreeable to PT; no voiced complaints. No R hip pain at rest; increases to 5/10 with ambulation, but eases with seated rest. Pt progressing ambulation and stair climbing this session. Pt remembers all 3 posterior hip precautions and has good understanding with function; however, pt does require cueing with sit to/from stand transfer for better Right lower extremity placement to avoid breaking 90 degrees especially with stand to sit. Continue PT this afternoon for continued progress of strength and endurance to improve all functional mobility safely.   Follow Up Recommendations  Home health PT     Equipment Recommendations       Recommendations for Other Services       Precautions / Restrictions Precautions Precautions: Posterior Hip;Fall Precaution Booklet Issued: No Precaution Comments: pt able to recall all precautions without cues Restrictions Weight Bearing Restrictions: Yes RLE Weight Bearing: Weight bearing as tolerated    Mobility  Bed Mobility Overal bed mobility: Needs Assistance Bed Mobility: Supine to Sit     Supine to sit: Supervision;HOB elevated     General bed mobility comments: no physical assist required with no significant increase in pain reported  Transfers Overall transfer level: Needs assistance Equipment used: Rolling walker (2 wheeled) Transfers: Sit to/from Stand Sit to Stand: Min guard         General transfer comment: initial cues for hand placement to maximize safety  Ambulation/Gait Ambulation/Gait assistance: Supervision;Min guard Ambulation Distance (Feet): 200 Feet Assistive device: Rolling walker (2 wheeled) Gait Pattern/deviations:  Step-through pattern;Antalgic;Decreased stance time - right;Decreased stride length;Decreased step length - left     General Gait Details: Rw adjusted for appropriate height ,which decreases back discomfort and offers greater support. Mildly antalgic. Good adherence to posterior hip precautions. Good stability   Stairs Stairs: Yes   Stair Management: One rail Right;Step to pattern;Forwards;With cane (R rail up/L down; cane opposite side) Number of Stairs: 4 General stair comments: Remenbers sequence well. Safe, steady  Wheelchair Mobility    Modified Rankin (Stroke Patients Only)       Balance Overall balance assessment: Needs assistance Sitting-balance support: Feet supported;No upper extremity supported Sitting balance-Leahy Scale: Normal     Standing balance support: Single extremity supported;During functional activity Standing balance-Leahy Scale: Good                      Cognition Arousal/Alertness: Awake/alert Behavior During Therapy: WFL for tasks assessed/performed Overall Cognitive Status: Within Functional Limits for tasks assessed                      Exercises Other Exercises Other Exercises: pt educated in falls prevention strategies and ECS to support safety at home  Other Exercises: pt educated in compensatory strategy to help spouse in donning pt's TED hose     General Comments        Pertinent Vitals/Pain Pain Assessment: 0-10 Pain Score: 5  (None at rest) Pain Location: R hip; back better with adjusting rw for improved fit Pain Descriptors / Indicators: Aching;Operative site guarding Pain Intervention(s): Limited activity within patient's tolerance;Monitored during session;Ice applied;Repositioned;RN gave pain meds during session    Home Living Family/patient expects to be discharged  to:: Private residence Living Arrangements: Spouse/significant other Available Help at Discharge: Family;Available 24 hours/day Type of Home:  House Home Access: Stairs to enter Entrance Stairs-Rails: Right Home Layout: Two level;Full bath on main level;Bed/bath upstairs Home Equipment: Shower seat - built in;Walker - 2 wheels;Cane - single point;Adaptive equipment;Hand held shower head      Prior Function Level of Independence: Independent      Comments: Pt independent with all ADL, IADL, including med mgt, driving, 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), yard work, farm work; previous R and L knee surgeries   PT Goals (current goals can now be found in the care plan section) Acute Rehab PT Goals Patient Stated Goal: go home  Progress towards PT goals: Progressing toward goals    Frequency    BID      PT Plan Current plan remains appropriate    Co-evaluation             End of Session Equipment Utilized During Treatment: Gait belt Activity Tolerance: Patient tolerated treatment well Patient left: with chair alarm set;in chair;with call bell/phone within reach;with family/visitor present     Time: [id number] PT Time Calculation (min) (ACUTE ONLY): 31 min  Charges:  $Gait Training: 23-37 mins                    G CodesLarae Grooms, PTA 12/09/2016, 10:26 AM

## 2016-12-09 NOTE — Discharge Instructions (Signed)
POSTERIOR TOTAL HIP REPLACEMENT POSTOPERATIVE DIRECTIONS ° °Hip Rehabilitation, Guidelines Following Surgery  °The results of a hip operation are greatly improved after range of motion and muscle strengthening exercises. Follow all safety measures which are given to protect your hip. If any of these exercises cause increased pain or swelling in your joint, decrease the amount until you are comfortable again. Then slowly increase the exercises. Call your caregiver if you have problems or questions.  ° °HOME CARE INSTRUCTIONS  °Remove items at home which could result in a fall. This includes throw rugs or furniture in walking pathways.  °· ICE to the affected hip every three hours for 30 minutes at a time and then as needed for pain and swelling.  Continue to use ice on the hip for pain and swelling from surgery. You may notice swelling that will progress down to the foot and ankle.  This is normal after surgery.  Elevate the leg when you are not up walking on it.   °· Continue to use the breathing machine which will help keep your temperature down.  It is common for your temperature to cycle up and down following surgery, especially at night when you are not up moving around and exerting yourself.  The breathing machine keeps your lungs expanded and your temperature down. ° °DIET °You may resume your previous home diet once your are discharged from the hospital. ° °DRESSING / WOUND CARE / SHOWERING °Keep the surgical dressing until follow up.  The dressing is water proof, so you can shower without any extra covering.  IF THE DRESSING FALLS OFF or the wound gets wet inside, change the dressing with sterile gauze.  Please use good hand washing techniques before changing the dressing.  Do not use any lotions or creams on the incision until instructed by your surgeon.   °You need to keep your dressing dry after discharge.   °Change the surgical dressing if needed with Physical Therapy and reapply a dry dressing each  time. ° ° ° °ACTIVITY °Walk with your walker as instructed. °Use walker as long as suggested by your caregivers. °Avoid periods of inactivity such as sitting longer than an hour when not asleep. This helps prevent blood clots.  °You may resume a sexual relationship in one month or when given the OK by your doctor.  °You may return to work once you are cleared by your doctor.  °Do not drive a car for 6 weeks or until released by you surgeon.  °Do not drive while taking narcotics. ° °WEIGHT BEARING °Weight bearing as tolerated with assist device (walker, cane, etc) as directed, use it as long as suggested by your surgeon or therapist, typically at least 4-6 weeks. ° °POSTOPERATIVE CONSTIPATION PROTOCOL °Constipation - defined medically as fewer than three stools per week and severe constipation as less than one stool per week. ° °One of the most common issues patients have following surgery is constipation.  Even if you have a regular bowel pattern at home, your normal regimen is likely to be disrupted due to multiple reasons following surgery.  Combination of anesthesia, postoperative narcotics, change in appetite and fluid intake all can affect your bowels.  In order to avoid complications following surgery, here are some recommendations in order to help you during your recovery period. ° °Colace (docusate) - Pick up an over-the-counter form of Colace or another stool softener and take twice a day as long as you are requiring postoperative pain medications.  Take with a   full glass of water daily.  If you experience loose stools or diarrhea, hold the colace until you stool forms back up.  If your symptoms do not get better within 1 week or if they get worse, check with your doctor. ° °Dulcolax (bisacodyl) - Pick up over-the-counter and take as directed by the product packaging as needed to assist with the movement of your bowels.  Take with a full glass of water.  Use this product as needed if not relieved by Colace  only.  ° °MiraLax (polyethylene glycol) - Pick up over-the-counter to have on hand.  MiraLax is a solution that will increase the amount of water in your bowels to assist with bowel movements.  Take as directed and can mix with a glass of water, juice, soda, coffee, or tea.  Take if you go more than two days without a movement. °Do not use MiraLax more than once per day. Call your doctor if you are still constipated or irregular after using this medication for 7 days in a row. ° °If you continue to have problems with postoperative constipation, please contact the office for further assistance and recommendations.  If you experience "the worst abdominal pain ever" or develop nausea or vomiting, please contact the office immediatly for further recommendations for treatment. ° °ITCHING ° If you experience itching with your medications, try taking only a single pain pill, or even half a pain pill at a time.  You can also use Benadryl over the counter for itching or also to help with sleep.  ° °TED HOSE STOCKINGS °Wear the elastic stockings on both legs for three weeks following surgery during the day but you may remove then at night for sleeping. ° °MEDICATIONS °See your medication summary on the “After Visit Summary” that the nursing staff will review with you prior to discharge.  You may have some home medications which will be placed on hold until you complete the course of blood thinner medication.  It is important for you to complete the blood thinner medication as prescribed by your surgeon.  Continue your approved medications as instructed at time of discharge. ° °PRECAUTIONS °If you experience chest pain or shortness of breath - call 911 immediately for transfer to the hospital emergency department.  °If you develop a fever greater that 101 F, purulent drainage from wound, increased redness or drainage from wound, foul odor from the wound/dressing, or calf pain - CONTACT YOUR SURGEON.   °                                                 °FOLLOW-UP APPOINTMENTS °Make sure you keep all of your appointments after your operation with your surgeon and caregivers. You should call the office at the above phone number and make an appointment for approximately two weeks after the date of your surgery or on the date instructed by your surgeon outlined in the "After Visit Summary". ° °RANGE OF MOTION AND STRENGTHENING EXERCISES  °These exercises are designed to help you keep full movement of your hip joint. Follow your caregiver's or physical therapist's instructions. Perform all exercises about fifteen times, three times per day or as directed. Exercise both hips, even if you have had only one joint replacement. These exercises can be done on a training (exercise) mat, on the floor, on a table or on a   bed. Use whatever works the best and is most comfortable for you. Use music or television while you are exercising so that the exercises are a pleasant break in your day. This will make your life better with the exercises acting as a break in routine you can look forward to.  °Lying on your back, slowly slide your foot toward your buttocks, raising your knee up off the floor. Then slowly slide your foot back down until your leg is straight again.  °Lying on your back spread your legs as far apart as you can without causing discomfort.  °Lying on your side, raise your upper leg and foot straight up from the floor as far as is comfortable. Slowly lower the leg and repeat.  °Lying on your back, tighten up the muscle in the front of your thigh (quadriceps muscles). You can do this by keeping your leg straight and trying to raise your heel off the floor. This helps strengthen the largest muscle supporting your knee.  °Lying on your back, tighten up the muscles of your buttocks both with the legs straight and with the knee bent at a comfortable angle while keeping your heel on the floor.  ° ° ° ° °IF YOU ARE TRANSFERRED TO A SKILLED REHAB  FACILITY °If the patient is transferred to a skilled rehab facility following release from the hospital, a list of the current medications will be sent to the facility for the patient to continue.  When discharged from the skilled rehab facility, please have the facility set up the patient's Home Health Physical Therapy prior to being released. Also, the skilled facility will be responsible for providing the patient with their medications at time of release from the facility to include their pain medication, the muscle relaxants, and their blood thinner medication. If the patient is still at the rehab facility at time of the two week follow up appointment, the skilled rehab facility will also need to assist the patient in arranging follow up appointment in our office and any transportation needs. ° °MAKE SURE YOU:  °Understand these instructions.  °Get help right away if you are not doing well or get worse.  ° ° °Pick up stool softner and laxative for home use following surgery while on pain medications. °Do not submerge incision under water. °Please use good hand washing techniques while changing dressing each day. °May shower starting three days after surgery. °Please use a clean towel to pat the incision dry following showers. °Continue to use ice for pain and swelling after surgery. °Do not use any lotions or creams on the incision until instructed by your surgeon. °

## 2016-12-09 NOTE — Progress Notes (Signed)
  Subjective: 1 Day Post-Op Procedure(s) (LRB): TOTAL HIP ARTHROPLASTY (Right) Patient reports pain as mild.   Patient seen in rounds with Dr. Marry Guan. Patient is well, and has had no acute complaints or problems Plan is to go Home after hospital stay. Negative for chest pain and shortness of breath Fever: no Gastrointestinal: Negative for nausea and vomiting  Objective: Vital signs in last 24 hours: Temp:  [97.1 F (36.2 C)-98.3 F (36.8 C)] 98.1 F (36.7 C) (02/15 0406) Pulse Rate:  [69-99] 80 (02/15 0406) Resp:  [12-18] 18 (02/15 0406) BP: (111-155)/(54-83) 116/54 (02/15 0406) SpO2:  [97 %-100 %] 98 % (02/15 0406) Weight:  [122.2 kg (269 lb 6.4 oz)] 122.2 kg (269 lb 6.4 oz) (02/14 1640)  Intake/Output from previous day:  Intake/Output Summary (Last 24 hours) at 12/09/16 0606 Last data filed at 12/09/16 0423  Gross per 24 hour  Intake             1930 ml  Output             4440 ml  Net            -2510 ml    Intake/Output this shift: Total I/O In: 1080 [I.V.:1080] Out: 2685 [Urine:2425; Drains:260]  Labs:  Recent Labs  12/09/16 0524  HGB 13.8    Recent Labs  12/09/16 0524  WBC 11.0*  RBC 4.25*  HCT 38.5*  PLT 214    Recent Labs  12/09/16 0524  NA 136  K 4.1  CL 108  CO2 24  BUN 14  CREATININE 1.28*  GLUCOSE 137*  CALCIUM 7.9*   No results for input(s): LABPT, INR in the last 72 hours.   EXAM General - Patient is Alert and Oriented Extremity - Sensation intact distally Dorsiflexion/Plantar flexion intact No cellulitis present Compartment soft Dressing/Incision - clean, dry, no drainage with the Hemovac intact Motor Function - intact, moving foot and toes well on exam.   Past Medical History:  Diagnosis Date  . Arthritis   . Hyperlipidemia    history of elevated cholesterol, better after pt started exercising.  Marland Kitchen Hypertension   . PONV (postoperative nausea and vomiting)    40 years ago with hernia surgery.    Assessment/Plan: 1  Day Post-Op Procedure(s) (LRB): TOTAL HIP ARTHROPLASTY (Right) Active Problems:   S/P total hip arthroplasty  Estimated body mass index is 39.78 kg/m as calculated from the following:   Height as of this encounter: 5\' 9"  (1.753 m).   Weight as of this encounter: 122.2 kg (269 lb 6.4 oz). Advance diet Up with therapy D/C IV fluids Plan for discharge tomorrow to go home with home health physical therapy Bowel management  DVT Prophylaxis - Lovenox, Foot Pumps and TED hose Weight-Bearing as tolerated to right leg  Reche Dixon, PA-C Orthopaedic Surgery 12/09/2016, 6:06 AM

## 2016-12-09 NOTE — Progress Notes (Signed)
Physical Therapy Treatment Patient Details Name: Donald [name] MRN: [id number] DOB: [birth year] Today's Date: 12/09/2016    History of Present Illness Patient is a 66 y/o male that presents with R THR on 12/08/2016. PMHx includes arthritis, HLD, HTN, chronic lumbar back pain    PT Comments    Pt agreeable to PT; pain in right hip manageable (4/10). Pt initiated in stand exercises for Right lower extremity. Progressing ambulation well; cueing primarily for decreasing speed and improved rolling walker placement for safety. Continue PT to progress strength, endurance and quality of ambulation to improve all functional mobility and allow for a safe return home.   Follow Up Recommendations  Home health PT     Equipment Recommendations       Recommendations for Other Services       Precautions / Restrictions Precautions Precautions: Posterior Hip;Fall Restrictions Weight Bearing Restrictions: Yes RLE Weight Bearing: Weight bearing as tolerated    Mobility  Bed Mobility               General bed mobility comments: not tested; up in chair  Transfers Overall transfer level: Needs assistance Equipment used: Rolling walker (2 wheeled) Transfers: Sit to/from Stand Sit to Stand: Supervision         General transfer comment: Cues for greater protection of posterior hip precaution. pt aware of precaution, but functionally pushes the limit of 90 degree flexion especially with stand to sit  Ambulation/Gait Ambulation/Gait assistance: Supervision;Min guard Ambulation Distance (Feet): 250 Feet Assistive device: Rolling walker (2 wheeled) Gait Pattern/deviations: Step-through pattern;Antalgic;Decreased stance time - right;Decreased stride length;Decreased step length - left     General Gait Details: Cues for safe speed and not ambulating too far into rw    Stairs            Wheelchair Mobility    Modified Rankin (Stroke Patients Only)       Balance Overall  balance assessment: Modified Independent                                  Cognition Arousal/Alertness: Awake/alert Behavior During Therapy: WFL for tasks assessed/performed Overall Cognitive Status: Within Functional Limits for tasks assessed                      Exercises Total Joint Exercises Hip ABduction/ADduction: Strengthening;Right;10 reps;Standing Straight Leg Raises: Strengthening;Right;10 reps;Standing Knee Flexion: Strengthening;Right;10 reps;Standing Marching in Standing: Right;Strengthening;10 reps;Standing Standing Hip Extension: Right;10 reps;Standing;Strengthening    General Comments        Pertinent Vitals/Pain Pain Assessment: 0-10 Pain Score: 4  Pain Location: R hip; back better with adjusting rw for improved fit Pain Descriptors / Indicators: Aching;Operative site guarding    Home Living                      Prior Function            PT Goals (current goals can now be found in the care plan section) Progress towards PT goals: Progressing toward goals    Frequency    BID      PT Plan Current plan remains appropriate    Co-evaluation             End of Session Equipment Utilized During Treatment: Gait belt Activity Tolerance: Patient tolerated treatment well Patient left: Other (comment) (in bathroom on commode; nursing aware)     Time: [id number] PT  Time Calculation (min) (ACUTE ONLY): 24 min  Charges:  $Gait Training: 8-22 mins $Therapeutic Exercise: 8-22 mins                    G Codes:      Larae Grooms, PTA 12/09/2016, 3:07 PM

## 2016-12-09 NOTE — Progress Notes (Signed)
Alert and oriented. Minimal pain. Encouraged to use incentive spirometer. dsg to right hip dry and intact. hemovac in place. Up in chair last evening. Ice pack in place. Vital signs stable.

## 2016-12-09 NOTE — Evaluation (Signed)
Occupational Therapy Evaluation Patient Details Name: MONSERRATE FERRELLI MRN: [id number] DOB: 27-Jan-1951 Today's Date: 12/09/2016    History of Present Illness Patient is a 66 y/o male that presents with R THR on 12/08/2016. PMHx includes arthritis, HLD, HTN, chronic lumbar back pain   Clinical Impression   Pt seen for OT evaluation this date. Pt is 66 year old male s/p R THR(posterior approach) who lives at home with his spouse in 2 story home (full bath on main floor, bedroom upstairs). Pt was independent in all ADLs prior to surgery and is eager to return to PLOF including golfing, working.  Pt is currently limited in functional ADLs due to pain, and decreased strength/ROM in RLE.  Pt requires min guard for LB dressing and bathing skills due to pain and safety for hip precautions. Pt able to verbalize 3/3 hip precautions without verbal cues but required cuing during functional self care tasks to maintain hip precautions. Pt would benefit from continued skilled OT services for further education in assistive devices, functional mobility, and education in recommendations for home modifications and energy conservation strategies to increase safety and prevent falls.  Pt is a good candidate for return home with HHOT to continue rehabilitation pending ability to negotiate stairs with PT.    Follow Up Recommendations  Home health OT    Equipment Recommendations  3 in 1 bedside commode;Other (comment) (sock aid)    Recommendations for Other Services       Precautions / Restrictions Precautions Precautions: Posterior Hip;Fall Precaution Booklet Issued: No Precaution Comments: pt able to recall all precautions without cues Restrictions Weight Bearing Restrictions: Yes RLE Weight Bearing: Weight bearing as tolerated      Mobility Bed Mobility Overal bed mobility: Needs Assistance Bed Mobility: Supine to Sit     Supine to sit: Supervision;HOB elevated     General bed mobility comments:  no physical assist required with no significant increase in pain reported  Transfers Overall transfer level: Needs assistance Equipment used: Rolling walker (2 wheeled) Transfers: Sit to/from Stand Sit to Stand: Min guard         General transfer comment: initial cues for hand placement to maximize safety    Balance Overall balance assessment: Needs assistance Sitting-balance support: Feet supported;No upper extremity supported Sitting balance-Leahy Scale: Normal     Standing balance support: Single extremity supported;During functional activity Standing balance-Leahy Scale: Good                              ADL Overall ADL's : Needs assistance/impaired             Lower Body Bathing: Set up;Sit to/from stand;Sitting/lateral leans;Min guard;With adaptive equipment;Cueing for safety   Upper Body Dressing : Set up;Sitting;Supervision/safety   Lower Body Dressing: Set up;Min guard;Sit to/from stand;With adaptive equipment;Cueing for compensatory techniques;Supervision/safety Lower Body Dressing Details (indicate cue type and reason): ocassional cues for hip precautions to don/doff socks with supervision while seated EOB; cues during donning shorts with AE during sit<>stand with min guard Toilet Transfer: Min Marine scientist Details (indicate cue type and reason): cues for hand placement to maximize safety         Functional mobility during ADLs: Min guard;Rolling walker General ADL Comments: pt generally min guard for LB ADL with cueing for maintaining hip precautions     Vision Vision Assessment?: No apparent visual deficits   Perception     Praxis Praxis Praxis tested?: Within  functional limits    Pertinent Vitals/Pain Pain Assessment: 0-10 Pain Score: 5  Pain Location: 5/10 R hip during functional mobility, 3/10 at rest Pain Descriptors / Indicators: Aching;Operative site guarding;Sore Pain Intervention(s): Limited activity  within patient's tolerance;Monitored during session;Ice applied;Repositioned;RN gave pain meds during session     Hand Dominance     Extremity/Trunk Assessment Upper Extremity Assessment Upper Extremity Assessment: Overall WFL for tasks assessed   Lower Extremity Assessment Lower Extremity Assessment: Defer to PT evaluation;RLE deficits/detail   Cervical / Trunk Assessment Cervical / Trunk Assessment: Normal   Communication Communication Communication: No difficulties   Cognition Arousal/Alertness: Awake/alert Behavior During Therapy: WFL for tasks assessed/performed Overall Cognitive Status: Within Functional Limits for tasks assessed                     General Comments       Exercises   Other Exercises Other Exercises: pt educated in falls prevention strategies and ECS to support safety at home  Other Exercises: pt educated in compensatory strategy to help spouse in donning pt's TED hose    Shoulder Instructions      Home Living Family/patient expects to be discharged to:: Private residence Living Arrangements: Spouse/significant other Available Help at Discharge: Family;Available 24 hours/day Type of Home: House Home Access: Stairs to enter [employer] of Steps: 3-4 Entrance Stairs-Rails: Right Home Layout: Two level;Full bath on main level;Bed/bath upstairs     Bathroom Shower/Tub: Tub/shower unit;Walk-in shower;Door (tub/shower on 1st floor, primarily uses walk in on 2nd floor) Shower/tub characteristics: Curtain Bathroom Toilet: Handicapped height (1st fl = standard height toilet; comfort height in master bath 2nd fl) Bathroom Accessibility: Yes How Accessible: Accessible via walker Home Equipment: Shower seat - built in;Walker - 2 wheels;Cane - single point;Adaptive equipment;Hand held shower head Adaptive Equipment: Reacher;Long-handled shoe horn        Prior Functioning/Environment Level of Independence: Independent         Comments: Pt independent with all ADL, IADL, including med mgt, driving, 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), yard work, farm work; previous R and L knee surgeries        OT Problem List: Pain;Decreased strength;Decreased range of motion;Decreased knowledge of use of DME or AE   OT Treatment/Interventions: Self-care/ADL training;Therapeutic exercise;Therapeutic activities;Energy conservation;DME and/or AE instruction;Patient/family education    OT Goals(Current goals can be found in the care plan section) Acute Rehab OT Goals Patient Stated Goal: go home  OT Goal Formulation: With patient Time For Goal Achievement: 12/16/16 Potential to Achieve Goals: Good  OT Frequency: Min 1X/week   Barriers to D/C:    main bed/bath on 2nd floor of home       Co-evaluation              End of Session Equipment Utilized During Treatment: Gait belt;Rolling walker  Activity Tolerance: Patient tolerated treatment well Patient left: in chair;with call bell/phone within reach;with chair alarm set;with SCD's reapplied   Time: 0821-0910 OT Time Calculation (min): 49 min Charges:  OT General Charges $OT Visit: 1 Procedure OT Evaluation $OT Eval Low Complexity: 1 Procedure OT Treatments $Self Care/Home Management : 38-52 mins G-Codes:    Corky Sox, OTR/L 12/09/2016, 10:23 AM

## 2016-12-09 NOTE — Progress Notes (Signed)
Clinical Social Worker (CSW) received SNF consult. PT is recommending home health. RN case manager aware of above. Please reconsult if future social work needs arise. CSW signing off.   Brodric Schauer, LCSW [phone] 

## 2016-12-09 NOTE — Care Management Note (Signed)
Case Management Note  Patient Details  Name: Donald [name] MRN: [id number] Date of Birth: 22-Aug-1951  Subjective/Objective:  POD # 1 Right THA. Met with patient at bedside. He lives at home with his spouse. PCP is Glendon Axe. He requests a 3 in 1. Ordered from [facility]. He has a walker at home. Discussed home health PT. [facility]. [facility] [place] [phone]. Called Lovenox 40 mg # 14 no refills.                 Action/Plan: [facility] for HHPT, Lovenox called in, # in 1 ordered. It is anticipated that patient will discharge tomorrow.   Expected Discharge Date:  12/10/16               Expected Discharge Plan:  [place]  In-House Referral:     Discharge planning Services  CM Consult  Post Acute Care Choice:  Durable Medical Equipment, Home Health Choice offered to:  Patient  DME Arranged:  3-N-1 DME Agency:  [place]:  PT [place]:  [facility] ([facility])  Status of Service:  In process, will continue to follow  If discussed at Long Length of Stay Meetings, dates discussed:    Additional Comments:  Jolly Mango, RN 12/09/2016, 9:39 AM

## 2016-12-09 NOTE — Addendum Note (Signed)
Addendum  created 12/09/16 0847 by Alvin Critchley, MD   Anesthesia Event edited

## 2016-12-10 LAB — CBC
HCT: 37.9 % — ABNORMAL LOW (ref 40.0–52.0)
HEMOGLOBIN: 13.6 g/dL (ref 13.0–18.0)
MCH: 32.4 pg (ref 26.0–34.0)
MCHC: 35.9 g/dL (ref 32.0–36.0)
MCV: 90.3 fL (ref 80.0–100.0)
Platelets: 202 10*3/uL (ref 150–440)
RBC: 4.2 MIL/uL — AB (ref 4.40–5.90)
RDW: 14.4 % (ref 11.5–14.5)
WBC: 10.5 10*3/uL (ref 3.8–10.6)

## 2016-12-10 LAB — BASIC METABOLIC PANEL
ANION GAP: 6 (ref 5–15)
BUN: 14 mg/dL (ref 6–20)
CHLORIDE: 107 mmol/L (ref 101–111)
CO2: 25 mmol/L (ref 22–32)
CREATININE: 1.07 mg/dL (ref 0.61–1.24)
Calcium: 8.1 mg/dL — ABNORMAL LOW (ref 8.9–10.3)
GFR calc non Af Amer: >60 mL/min (ref >60)
Glucose, Bld: 154 mg/dL — ABNORMAL HIGH (ref 65–99)
Potassium: 4.2 mmol/L (ref 3.5–5.1)
SODIUM: 138 mmol/L (ref 135–145)

## 2016-12-10 LAB — SURGICAL PATHOLOGY

## 2016-12-10 MED ORDER — HYDROCODONE-ACETAMINOPHEN 5-325 MG PO TABS: 1.0000 | ORAL_TABLET | ORAL | 0 refills | Status: DC | PRN

## 2016-12-10 NOTE — Progress Notes (Signed)
Physical Therapy Treatment Patient Details Name: Donald [name] MRN: [id number] DOB: [birth year] Today's Date: 12/10/2016    History of Present Illness Patient is a 66 y/o male that presents with R THR on 12/08/2016. PMHx includes arthritis, HLD, HTN, chronic lumbar back pain    PT Comments    Pt is able to ambulate safely with walker, but is unable to achieve terminal knee extension and therefore has somewhat forward flexed posture with limp on R side though he is safe t/o the effort and generally did well.  Pt was able to perform resisted and against gravity exercises with R hip and knee, PT did spend some time with TKE PROM/overpressure to assist with functional alignment.    Follow Up Recommendations  Home health PT     Equipment Recommendations  Rolling walker with 5" wheels    Recommendations for Other Services       Precautions / Restrictions Precautions Precautions: Posterior Hip;Fall Restrictions Weight Bearing Restrictions: Yes RLE Weight Bearing: Weight bearing as tolerated    Mobility  Bed Mobility               General bed mobility comments: not tested; up in chair  Transfers Overall transfer level: Modified independent Equipment used: Rolling walker (2 wheeled) Transfers: Sit to/from Stand Sit to Stand: Supervision         General transfer comment: Pt was able to rise w/o assist, needed very little positioning explaination and showed good safety  Ambulation/Gait Ambulation/Gait assistance: Supervision;Min guard Ambulation Distance (Feet): 250 Feet Assistive device: Rolling walker (2 wheeled)       General Gait Details: Pt's biggest issue appears to be inability to get R knee into TKE.  Pt with slight knee bend and hip flexion that does not allow him to stand upright or fully use R LE appropraitely for WBing, etc.  Pt was able to maintain consistent and safe speed but was highly reliant on the walker and generally did not have smooth cadence.     Stairs Stairs: Yes   Stair Management: One Biomedical scientist Rankin (Stroke Patients Only)       Balance Overall balance assessment: Modified Independent                                  Cognition Arousal/Alertness: Awake/alert Behavior During Therapy: WFL for tasks assessed/performed Overall Cognitive Status: Within Functional Limits for tasks assessed                      Exercises Total Joint Exercises Ankle Circles/Pumps: AROM;10 reps Quad Sets: Strengthening;10 reps Gluteal Sets: Strengthening;10 reps Hip ABduction/ADduction: Strengthening;10 reps Straight Leg Raises: AROM;Strengthening;10 reps Long Arc Quad: Strengthening;10 reps Knee Flexion: Strengthening;Right;10 reps    General Comments        Pertinent Vitals/Pain Pain Assessment: 0-10 Pain Score: 4     Home Living                      Prior Function            PT Goals (current goals can now be found in the care plan section) Progress towards PT goals: Progressing toward goals    Frequency    BID      PT Plan Current plan remains appropriate    Co-evaluation  End of Session Equipment Utilized During Treatment: Gait belt Activity Tolerance: Patient tolerated treatment well Patient left: with chair alarm set;with call bell/phone within reach     Time: 0818-0843 PT Time Calculation (min) (ACUTE ONLY): 25 min  Charges:  $Gait Training: 8-22 mins $Therapeutic Exercise: 8-22 mins                    G Codes:      Kreg Shropshire, DPT 12/10/2016, 10:17 AM

## 2016-12-10 NOTE — Progress Notes (Signed)
  Subjective: 2 Days Post-Op Procedure(s) (LRB): TOTAL HIP ARTHROPLASTY (Right) Patient reports pain as mild.   Patient seen in rounds with Dr. Marry Guan. Patient is well, and has had no acute complaints or problems Plan is to go Home after hospital stay. Negative for chest pain and shortness of breath Fever: no Gastrointestinal: Negative for nausea and vomiting  Objective: Vital signs in last 24 hours: Temp:  [98 F (36.7 C)-98.3 F (36.8 C)] 98.3 F (36.8 C) (02/16 0402) Pulse Rate:  [81-99] 92 (02/16 0402) Resp:  [16-18] 18 (02/16 0402) BP: (134-187)/(54-71) 134/59 (02/16 0402) SpO2:  [96 %-99 %] 96 % (02/16 0402)  Intake/Output from previous day:  Intake/Output Summary (Last 24 hours) at 12/10/16 0613 Last data filed at 12/10/16 0108  Gross per 24 hour  Intake              480 ml  Output             2070 ml  Net            -1590 ml    Intake/Output this shift: Total I/O In: -  Out: 370 [Urine:300; Drains:70]  Labs:  Recent Labs  12/09/16 0524 12/10/16 0418  HGB 13.8 13.6    Recent Labs  12/09/16 0524 12/10/16 0418  WBC 11.0* 10.5  RBC 4.25* 4.20*  HCT 38.5* 37.9*  PLT 214 202    Recent Labs  12/09/16 0524 12/10/16 0418  NA 136 138  K 4.1 4.2  CL 108 107  CO2 24 25  BUN 14 14  CREATININE 1.28* 1.07  GLUCOSE 137* 154*  CALCIUM 7.9* 8.1*   No results for input(s): LABPT, INR in the last 72 hours.   EXAM General - Patient is Alert and Oriented Extremity - Sensation intact distally Dorsiflexion/Plantar flexion intact No cellulitis present Compartment soft Dressing/Incision - clean, dry, no drainage with the Hemovac Removed Motor Function - intact, moving foot and toes well on exam. The patient ambulated 250 feet.  Past Medical History:  Diagnosis Date  . Arthritis   . Hyperlipidemia    history of elevated cholesterol, better after pt started exercising.  Marland Kitchen Hypertension   . PONV (postoperative nausea and vomiting)    40 years ago with  hernia surgery.    Assessment/Plan: 2 Days Post-Op Procedure(s) (LRB): TOTAL HIP ARTHROPLASTY (Right) Active Problems:   S/P total hip arthroplasty  Estimated body mass index is 39.78 kg/m as calculated from the following:   Height as of this encounter: 5\' 9"  (1.753 m).   Weight as of this encounter: 122.2 kg (269 lb 6.4 oz). Continue physical therapy. Plan to go home today with home health physical therapy Positive bowel movement. Discharge home today after physical therapy.  DVT Prophylaxis - Lovenox, Foot Pumps and TED hose Weight-Bearing as tolerated to right leg  Reche Dixon, PA-C Orthopaedic Surgery 12/10/2016, 6:13 AM

## 2016-12-10 NOTE — Progress Notes (Signed)
Patient was discharged home with wife. IV removed with cath intact. Changed dressing to right hip. Reviewed meds and last dose given.  Gave patient extra dressing to take home and room bin meds. [facility] sent with patient.

## 2016-12-10 NOTE — Discharge Summary (Signed)
Physician Discharge Summary  Subjective: 2 Days Post-Op Procedure(s) (LRB): TOTAL HIP ARTHROPLASTY (Right) Patient reports pain as mild.   Patient seen in rounds with Dr. Marry Guan. Patient is well, and has had no acute complaints or problems Patient is ready to go home with home health physical therapy  Physician Discharge Summary  Patient ID: Donald [name] MRN: [id number] DOB/AGE: 67/28/1952 67 y.o.  Admit date: 12/08/2016 Discharge date: 12/10/2016  Admission Diagnoses:  Discharge Diagnoses:  Active Problems:   S/P total hip arthroplasty   Discharged Condition: good  Hospital Course: The patient is postop day 2 from a right total hip replacement. He is done very well since surgery. The patient has ambulated 250 feet with physical therapy. He is able to mobilize in the bed independently. He had a bowel movement yesterday.  Treatments: surgery:  PROCEDURE:  Right total hip arthroplasty  SURGEON:  Marciano Sequin. M.D.  ASSISTANT:  Vance Peper, PA (present and scrubbed throughout the case, critical for assistance with exposure, retraction, instrumentation, and closure)  ANESTHESIA: spinal  ESTIMATED BLOOD LOSS: 900 mL  FLUIDS REPLACED:  700 mL of crystalloid  DRAINS: 2 medium drains to a Hemovac reservoir  IMPLANTS UTILIZED: DePuy 13.5 mm small stature AML femoral stem, 56 mm OD Pinnacle 100 acetabular component, neutral Pinnacle Marathon polyethylene insert, and a 36 mm M-SPEC -2 mm hip ball   Discharge Exam: Blood pressure (!) 134/59, pulse 92, temperature 98.3 F (36.8 C), temperature source Oral, resp. rate 18, height 5\' 9"  (1.753 m), weight 122.2 kg (269 lb 6.4 oz), SpO2 96 %.   Disposition: 01-Home or Self Care   Allergies as of 12/10/2016   No Known Allergies     Medication List    TAKE these medications   aspirin 325 MG tablet Take 325 mg by mouth daily.   AXIRON TD Place 30 mg onto the skin daily.   B-12 PO Take 1 tablet by mouth daily.  5000 mcgm   enoxaparin 40 MG/0.4ML injection Commonly known as:  LOVENOX Inject 0.4 mLs (40 mg total) into the skin daily.   fluticasone 50 MCG/ACT nasal spray Commonly known as:  FLONASE Place 1 spray into both nostrils 2 (two) times daily.   HYDROcodone-acetaminophen 5-325 MG tablet Commonly known as:  NORCO/VICODIN Take 1-2 tablets by mouth every 4 (four) hours as needed for moderate pain.   meloxicam 15 MG tablet Commonly known as:  MOBIC Take 15 mg by mouth daily as needed for pain.   multivitamin with minerals Tabs tablet Take 1 tablet by mouth daily.   sildenafil 20 MG tablet Commonly known as:  REVATIO Take 20 mg by mouth daily.   traMADol 50 MG tablet Commonly known as:  ULTRAM Take 1-2 tablets (50-100 mg total) by mouth every 4 (four) hours as needed for moderate pain.   TURMERIC PO Take 2 capsules by mouth daily. 1000 mg capsules   Zinc 50 MG Caps Take 50 mg by mouth daily.            Durable Medical Equipment        Start     Ordered   12/08/16 1330  DME Walker rolling  Once    Question:  Patient needs a walker to treat with the following condition  Answer:  S/P total hip arthroplasty   12/08/16 1329   12/08/16 1330  DME Bedside commode  Once    Question:  Patient needs a bedside commode to treat with the following condition  Answer:  S/P total hip arthroplasty   12/08/16 1329     Follow-up Information    Dereck Leep, MD On 01/20/2017.   Specialty:  Orthopedic Surgery Why:  at 9:30am Contact information: [place] [address] [phone]           Signed: Prescott Parma, Damarie Schoolfield 12/10/2016, 6:17 AM   Objective: Vital signs in last 24 hours: Temp:  [98 F (36.7 C)-98.3 F (36.8 C)] 98.3 F (36.8 C) (02/16 0402) Pulse Rate:  [81-99] 92 (02/16 0402) Resp:  [16-18] 18 (02/16 0402) BP: (134-187)/(54-71) 134/59 (02/16 0402) SpO2:  [96 %-99 %] 96 % (02/16 0402)  Intake/Output from previous  day:  Intake/Output Summary (Last 24 hours) at 12/10/16 0617 Last data filed at 12/10/16 0108  Gross per 24 hour  Intake              480 ml  Output             2070 ml  Net            -1590 ml    Intake/Output this shift: Total I/O In: -  Out: 370 [Urine:300; Drains:70]  Labs:  Recent Labs  12/09/16 0524 12/10/16 0418  HGB 13.8 13.6    Recent Labs  12/09/16 0524 12/10/16 0418  WBC 11.0* 10.5  RBC 4.25* 4.20*  HCT 38.5* 37.9*  PLT 214 202    Recent Labs  12/09/16 0524 12/10/16 0418  NA 136 138  K 4.1 4.2  CL 108 107  CO2 24 25  BUN 14 14  CREATININE 1.28* 1.07  GLUCOSE 137* 154*  CALCIUM 7.9* 8.1*   No results for input(s): LABPT, INR in the last 72 hours.  EXAM: General - Patient is Alert and Oriented Extremity - Neurovascular intact Dorsiflexion/Plantar flexion intact No cellulitis present Compartment soft Incision - clean, dry, no drainage Motor Function -  the patient is able to plantarflex and dorsiflex his feet as well as doing a straight leg raise. He can mobilize the bed independently with rolling over.  Assessment/Plan: 2 Days Post-Op Procedure(s) (LRB): TOTAL HIP ARTHROPLASTY (Right) Procedure(s) (LRB): TOTAL HIP ARTHROPLASTY (Right) Past Medical History:  Diagnosis Date  . Arthritis   . Hyperlipidemia    history of elevated cholesterol, better after pt started exercising.  Marland Kitchen Hypertension   . PONV (postoperative nausea and vomiting)    40 years ago with hernia surgery.   Active Problems:   S/P total hip arthroplasty  Estimated body mass index is 39.78 kg/m as calculated from the following:   Height as of this encounter: 5\' 9"  (1.753 m).   Weight as of this encounter: 122.2 kg (269 lb 6.4 oz). Discharge home with home health Diet - Regular diet Follow up - in 6 weeks Activity - WBAT Disposition - Home Condition Upon Discharge - Good DVT Prophylaxis - Lovenox and TED hose  Reche Dixon, PA-C Orthopaedic Surgery 12/10/2016,  6:17 AM

## 2016-12-10 NOTE — Progress Notes (Signed)
Patient was able to give lovenox injection and review lovenox kit with patient

## 2016-12-10 NOTE — Care Management Note (Signed)
Case Management Note  Patient Details  Name: Donald [name] MRN: [id number] Date of Birth: [birth year]  Subjective/Objective:   Discharging today                 Action/Plan: [facility] notified of discharge.   Expected Discharge Date:  12/10/16               Expected Discharge Plan:  [place]  In-House Referral:     Discharge planning Services  CM Consult  Post Acute Care Choice:  Durable Medical Equipment, Home Health Choice offered to:  Patient  DME Arranged:  3-N-1 DME Agency:  [place]:  PT [place]:  [facility] ([facility])  Status of Service:  Completed, signed off  If discussed at [place] of Stay Meetings, dates discussed:    Additional Comments:  Jolly Mango, RN 12/10/2016, 9:05 AM

## 2017-01-24 DIAGNOSIS — C44111 Basal cell carcinoma of skin of unspecified eyelid, including canthus: Secondary | ICD-10-CM

## 2017-01-24 DIAGNOSIS — Z85828 Personal history of other malignant neoplasm of skin: Secondary | ICD-10-CM

## 2017-01-24 HISTORY — DX: Personal history of other malignant neoplasm of skin: Z85.828

## 2017-01-24 HISTORY — DX: Basal cell carcinoma of skin of unspecified eyelid, including canthus: C44.111

## 2018-11-09 ENCOUNTER — Ambulatory Visit
Admission: RE | Admit: 2018-11-09 | Discharge: 2018-11-09 | Disposition: A | Discharge status: Home / Self Care | Payer: Medicare Other | Source: Ambulatory Visit | Attending: Orthopedic Surgery | Admitting: Orthopedic Surgery

## 2018-11-09 DIAGNOSIS — D751 Secondary polycythemia: Secondary | ICD-10-CM | POA: Insufficient documentation

## 2018-11-15 ENCOUNTER — Ambulatory Visit
Admission: RE | Admit: 2018-11-15 | Discharge: 2018-11-15 | Disposition: A | Discharge status: Home / Self Care | Payer: Medicare Other | Source: Ambulatory Visit | Attending: Internal Medicine | Admitting: Internal Medicine

## 2018-11-15 DIAGNOSIS — D751 Secondary polycythemia: Secondary | ICD-10-CM | POA: Diagnosis present

## 2018-11-15 LAB — CBC
HEMATOCRIT: 52.3 % — AB (ref 39.0–52.0)
Hemoglobin: 17.5 g/dL — ABNORMAL HIGH (ref 13.0–17.0)
MCH: 29.5 pg (ref 26.0–34.0)
MCHC: 33.5 g/dL (ref 30.0–36.0)
MCV: 88 fL (ref 80.0–100.0)
NRBC: 0 % (ref 0.0–0.2)
PLATELETS: 278 10*3/uL (ref 150–400)
RBC: 5.94 MIL/uL — ABNORMAL HIGH (ref 4.22–5.81)
RDW: 15.8 % — ABNORMAL HIGH (ref 11.5–15.5)
WBC: 8.8 10*3/uL (ref 4.0–10.5)

## 2019-01-23 ENCOUNTER — Other Ambulatory Visit: Payer: Self-pay | Admitting: Internal Medicine

## 2019-01-23 DIAGNOSIS — R1011 Right upper quadrant pain: Secondary | ICD-10-CM

## 2019-02-22 ENCOUNTER — Ambulatory Visit
Admission: RE | Admit: 2019-02-22 | Discharge: 2019-02-22 | Disposition: A | Discharge status: Home / Self Care | Payer: Medicare Other | Source: Ambulatory Visit | Attending: Internal Medicine | Admitting: Internal Medicine

## 2019-02-22 ENCOUNTER — Other Ambulatory Visit: Payer: Self-pay

## 2019-02-22 DIAGNOSIS — R1011 Right upper quadrant pain: Secondary | ICD-10-CM | POA: Insufficient documentation

## 2019-04-20 IMAGING — US ULTRASOUND ABDOMEN LIMITED
1 series · 14 of 25 positions shown · non-contrast
Comparison: [DATE]

CLINICAL DATA: Right upper quadrant pain

EXAM:
ULTRASOUND ABDOMEN LIMITED RIGHT UPPER QUADRANT

[Series 1: ultrasound abdomen limited · 0.22mm/px · 14 of 45 slices shown]
[im 1/45]
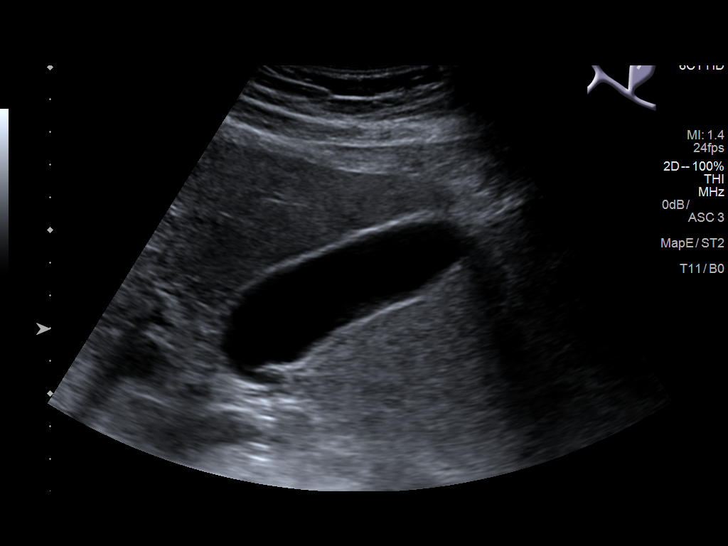
[im 4/45]
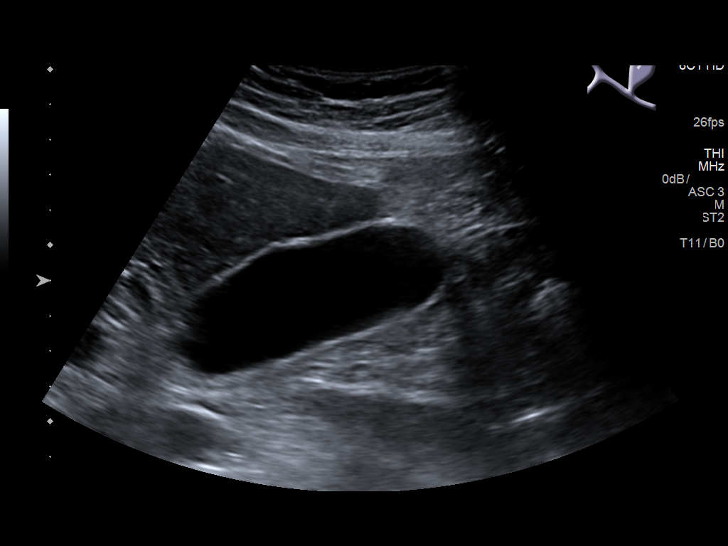
[im 8/45]
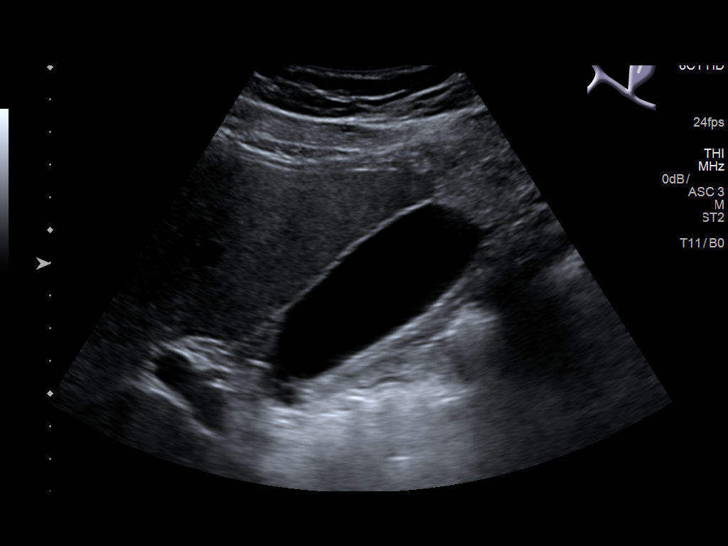
[im 12/45]
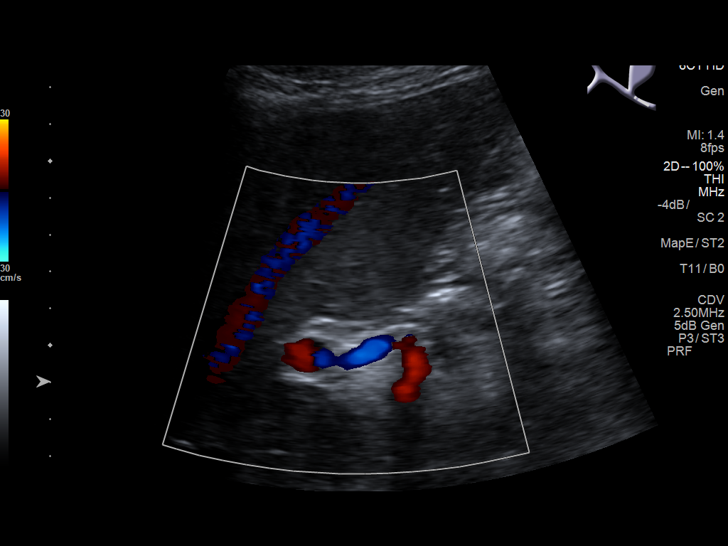
[im 15/45]
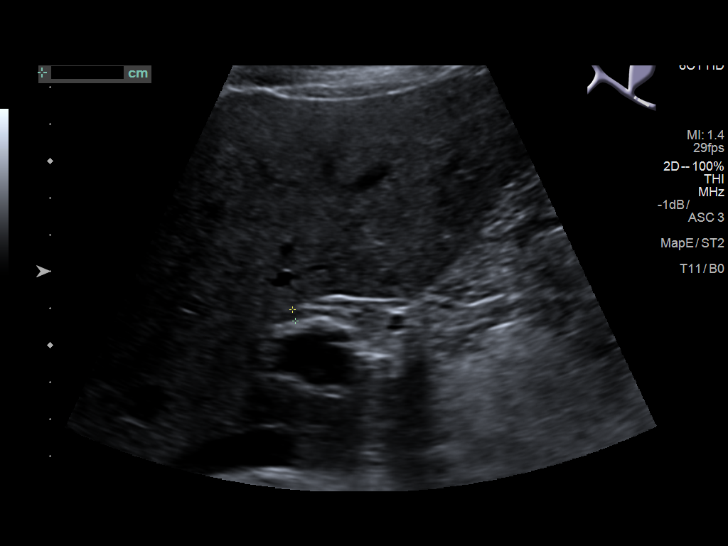
[im 17/45]
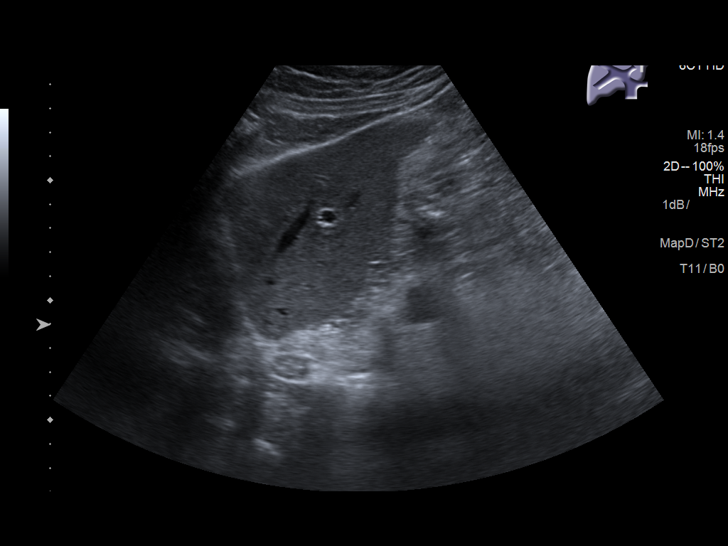
[im 21/45]
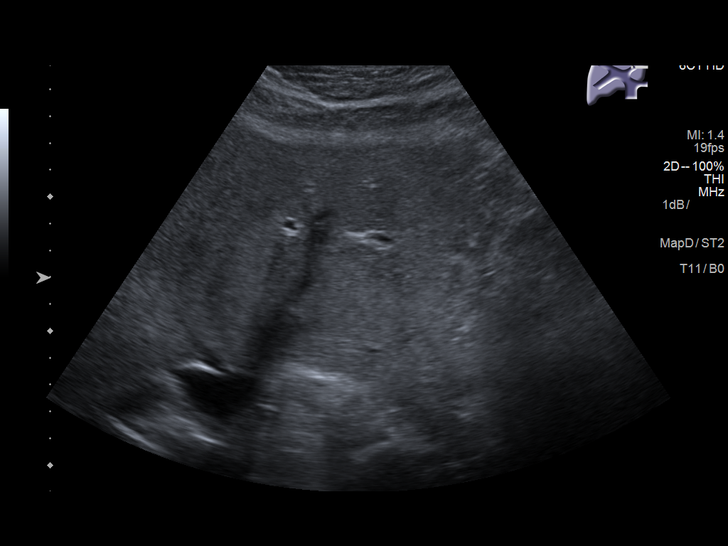
[im 24/45]
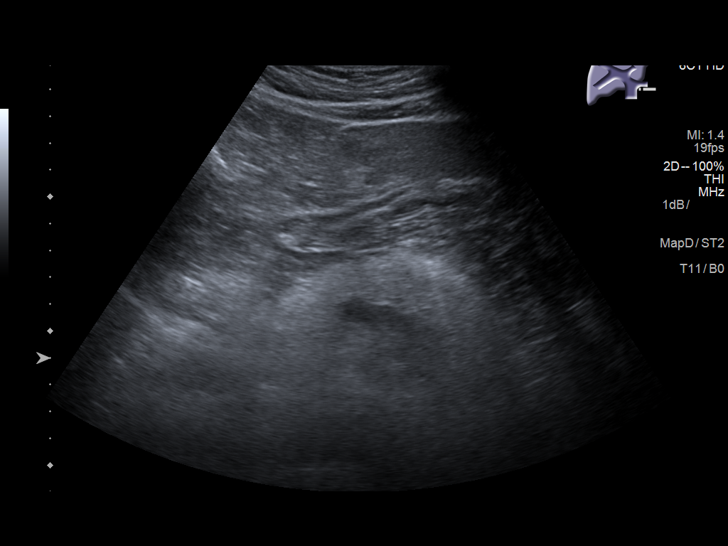
[im 28/45]
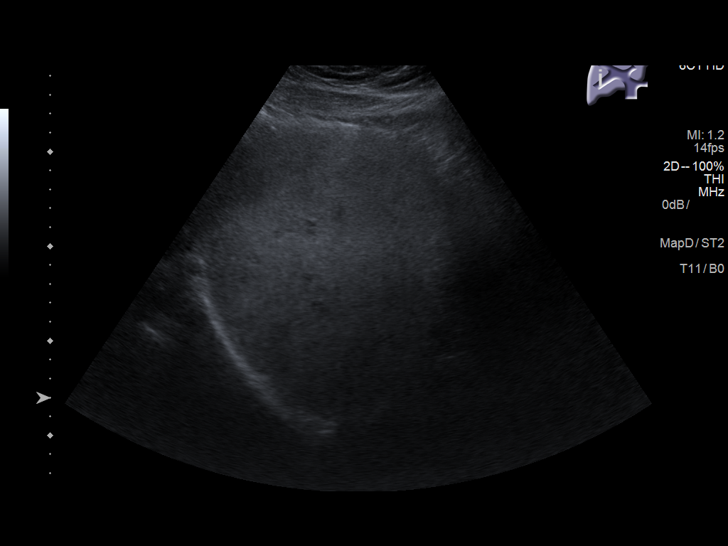
[im 30/45]
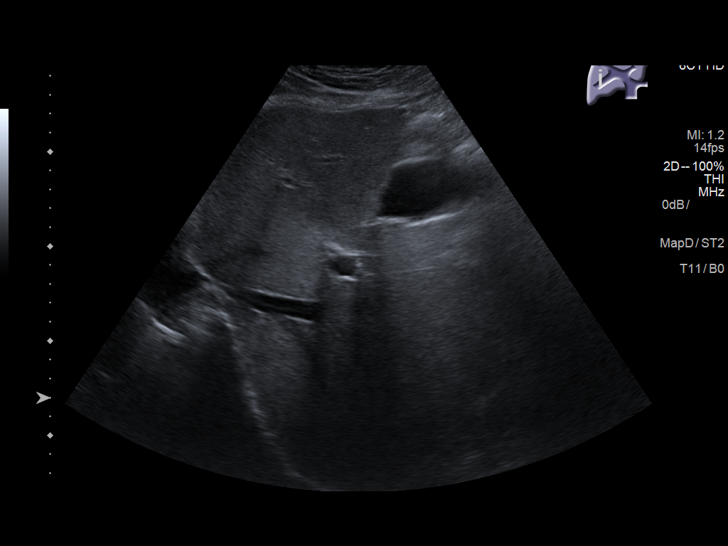
[im 34/45]
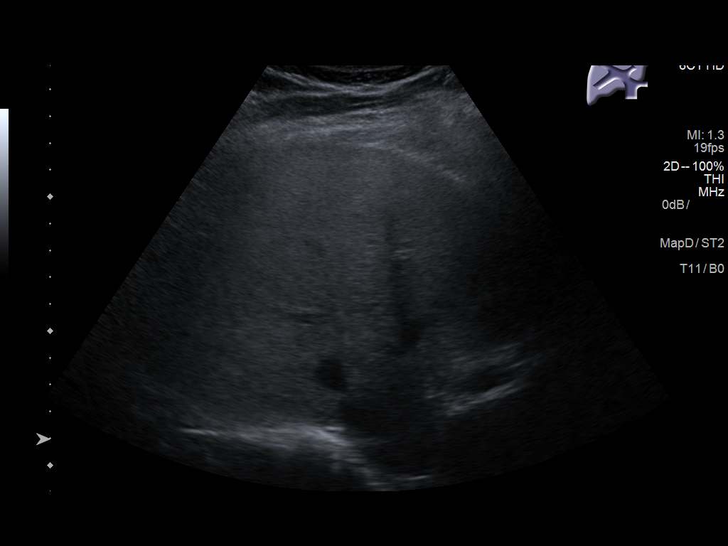
[im 37/45]
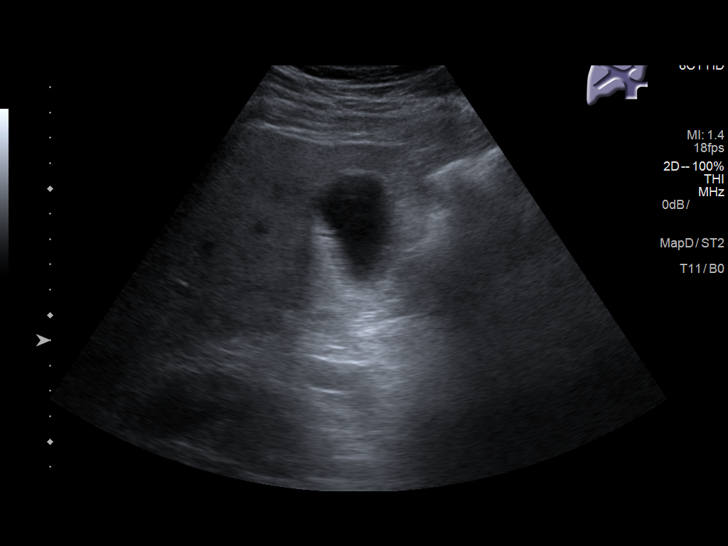
[im 41/45]
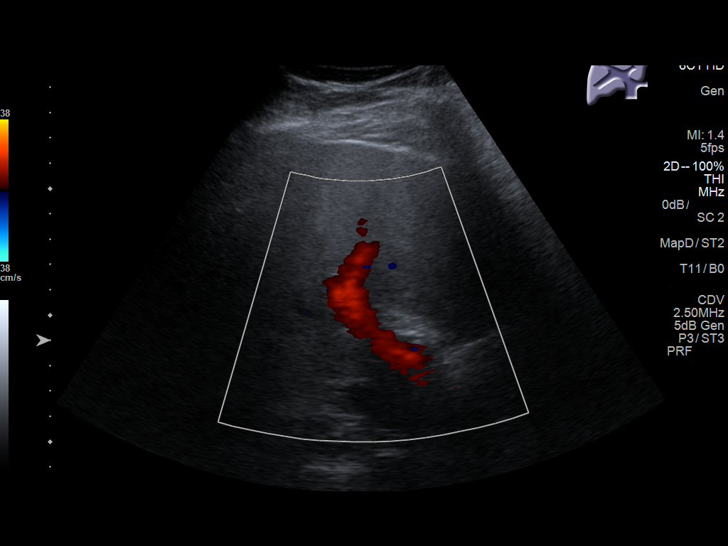
[im 45/45]
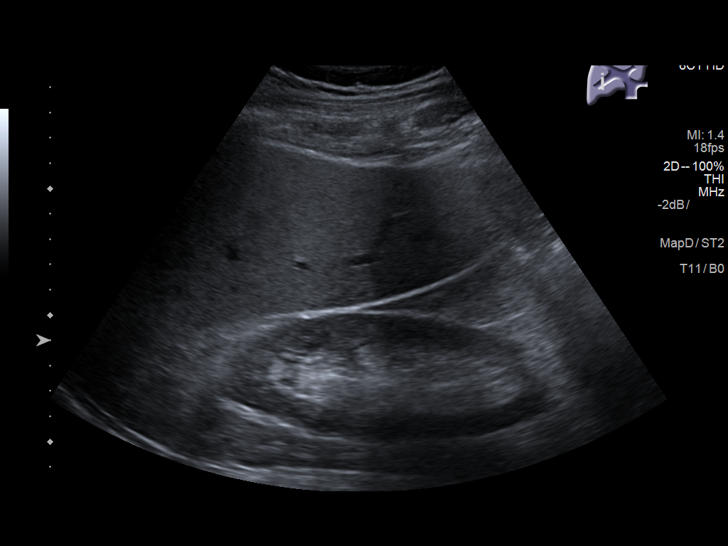

[14 of 25 positions shown; findings below may reference images not displayed]

FINDINGS: Gallbladder:

No gallstones or wall thickening visualized. There is no
pericholecystic fluid. No sonographic Murphy sign noted by
sonographer.

Common bile duct:

Diameter: 4 mm. No intrahepatic or extrahepatic biliary duct
dilatation.

Liver:

No focal lesion identified. Liver echotexture overall is increased.
Portal vein is patent on color Doppler imaging with normal direction
of blood flow towards the liver.
IMPRESSION: Overall increase in liver echotexture, a finding felt to be
indicative of hepatic steatosis. No focal liver lesions are
appreciable on this study.

Study otherwise unremarkable.

## 2019-10-12 ENCOUNTER — Other Ambulatory Visit: Payer: Self-pay

## 2019-10-12 ENCOUNTER — Ambulatory Visit: Payer: Medicare Other | Attending: Internal Medicine

## 2019-10-12 DIAGNOSIS — Z20822 Contact with and (suspected) exposure to covid-19: Secondary | ICD-10-CM

## 2019-10-13 LAB — NOVEL CORONAVIRUS, NAA: SARS-CoV-2, NAA: NOT DETECTED

## 2019-10-29 ENCOUNTER — Ambulatory Visit: Payer: Medicare Other | Attending: Internal Medicine

## 2019-10-29 DIAGNOSIS — Z20822 Contact with and (suspected) exposure to covid-19: Secondary | ICD-10-CM

## 2019-10-31 LAB — NOVEL CORONAVIRUS, NAA: SARS-CoV-2, NAA: NOT DETECTED

## 2019-11-19 ENCOUNTER — Ambulatory Visit: Payer: Medicare Other | Attending: Internal Medicine

## 2019-11-19 DIAGNOSIS — Z23 Encounter for immunization: Secondary | ICD-10-CM | POA: Insufficient documentation

## 2019-11-19 NOTE — Progress Notes (Signed)
   Covid-19 Vaccination Clinic  Name:  KHRIS FRISCHMAN    MRN: [id number] DOB: [birth year]  11/19/2019  Mr. Damm was observed post Covid-19 immunization for 15 minutes without incidence. He was provided with Vaccine Information Sheet and instruction to access the V-Safe system.   Mr. Prevost was instructed to call 911 with any severe reactions post vaccine: Marland Kitchen Difficulty breathing  . Swelling of your face and throat  . A fast heartbeat  . A bad rash all over your body  . Dizziness and weakness    Immunizations Administered    Name Date Dose VIS Date Route   Pfizer COVID-19 Vaccine 11/19/2019 11:29 AM 0.3 mL 10/05/2019 Intramuscular   Manufacturer: [place]   Lot: [id number]   [place]: [id number]

## 2019-12-10 ENCOUNTER — Ambulatory Visit: Payer: Medicare Other | Attending: Internal Medicine

## 2019-12-10 DIAGNOSIS — Z23 Encounter for immunization: Secondary | ICD-10-CM | POA: Insufficient documentation

## 2019-12-10 NOTE — Progress Notes (Signed)
   Covid-19 Vaccination Clinic  Name:  MARTIAL STATT    MRN: [id number] DOB: [birth year]  12/10/2019  Mr. Akkerman was observed post Covid-19 immunization for 15 minutes without incidence. He was provided with Vaccine Information Sheet and instruction to access the V-Safe system.   Mr. Reily was instructed to call 911 with any severe reactions post vaccine: Marland Kitchen Difficulty breathing  . Swelling of your face and throat  . A fast heartbeat  . A bad rash all over your body  . Dizziness and weakness    Immunizations Administered    Name Date Dose VIS Date Route   Pfizer COVID-19 Vaccine 12/10/2019 11:20 AM 0.3 mL 10/05/2019 Intramuscular   Manufacturer: [place]   Lot: [id number]   [place]: [id number]

## 2019-12-19 ENCOUNTER — Ambulatory Visit: Payer: Medicare Other

## 2020-01-30 ENCOUNTER — Ambulatory Visit: Payer: Medicare Other | Admitting: Dermatology

## 2020-01-30 ENCOUNTER — Other Ambulatory Visit: Payer: Self-pay

## 2020-01-30 DIAGNOSIS — L2489 Irritant contact dermatitis due to other agents: Secondary | ICD-10-CM | POA: Diagnosis not present

## 2020-01-30 DIAGNOSIS — L578 Other skin changes due to chronic exposure to nonionizing radiation: Secondary | ICD-10-CM

## 2020-01-30 DIAGNOSIS — Z1283 Encounter for screening for malignant neoplasm of skin: Secondary | ICD-10-CM | POA: Diagnosis not present

## 2020-01-30 DIAGNOSIS — L821 Other seborrheic keratosis: Secondary | ICD-10-CM | POA: Diagnosis not present

## 2020-01-30 DIAGNOSIS — L814 Other melanin hyperpigmentation: Secondary | ICD-10-CM

## 2020-01-30 DIAGNOSIS — L820 Inflamed seborrheic keratosis: Secondary | ICD-10-CM | POA: Diagnosis not present

## 2020-01-30 DIAGNOSIS — D229 Melanocytic nevi, unspecified: Secondary | ICD-10-CM

## 2020-01-30 DIAGNOSIS — Z85828 Personal history of other malignant neoplasm of skin: Secondary | ICD-10-CM

## 2020-01-30 MED ORDER — MOMETASONE FUROATE 0.1 % EX CREA: 1.0000 "application " | TOPICAL_CREAM | Freq: Every day | CUTANEOUS | 0 refills | Status: DC | PRN

## 2020-01-30 NOTE — Progress Notes (Signed)
   Follow-Up Visit   Subjective  Donald [name] is a 69 y.o. male who presents for the following: Annual Exam (TBSE hx of BCC L lat canthus 2018, L shoulder 2008) and Rash (rash on shoulder from wearing suspenders ).  Patient presents for total body skin exam for skin cancer screening.  The following portions of the chart were reviewed this encounter and updated as appropriate:     Review of Systems: No other skin or systemic complaints.  Objective  Well appearing patient in no apparent distress; mood and affect are within normal limits.  A full examination was performed including scalp, head, eyes, ears, nose, lips, neck, chest, axillae, abdomen, back, buttocks, bilateral upper extremities, bilateral lower extremities, hands, feet, fingers, toes, fingernails, and toenails. All findings within normal limits unless otherwise noted below.  Objective  L temple within hairline: Erythematous keratotic or waxy stuck-on papule or plaque.   Objective  shoulders: Linear pinkness  Assessment & Plan   Skin cancer screening performed today.  History of Basal Cell Carcinoma of the Skin - No evidence of recurrence today - Recommend regular full body skin exams - Recommend daily broad spectrum sunscreen SPF 30+ to sun-exposed areas, reapply every 2 hours as needed.  - Call if any new or changing lesions are noted between office visits. Seborrheic Keratoses - Stuck-on, waxy, tan-brown papules and plaques  - Discussed benign etiology and prognosis. - Observe - Call for any changes Actinic Damage - diffuse scaly erythematous macules with underlying dyspigmentation - Recommend daily broad spectrum sunscreen SPF 30+ to sun-exposed areas, reapply every 2 hours as needed.  - Call for new or changing lesions. Melanocytic Nevi - Tan-brown and/or pink-flesh-colored symmetric macules and papules - Benign appearing on exam today - Observation - Call clinic for new or changing moles - Recommend  daily use of broad spectrum spf 30+ sunscreen to sun-exposed areas.  Lentigines - Scattered tan macules - Discussed due to sun exposure - Benign, observe - Call for any changes   Inflamed seborrheic keratosis L temple within hairline  Destruction of lesion - L temple within hairline Complexity: simple   Destruction method: cryotherapy   Informed consent: discussed and consent obtained   Timeout:  patient name, date of birth, surgical site, and procedure verified Lesion destroyed using liquid nitrogen: Yes   Region frozen until ice ball extended beyond lesion: Yes   Outcome: patient tolerated procedure well with no complications   Post-procedure details: wound care instructions given    Irritant versus allergic contact dermatitis due to other agents shoulders Acute problem with symptoms. Contact dermatitis from clothing suspenders   mometasone (ELOCON) 0.1 % cream - shoulders  Return in about 1 year (around 01/29/2021) for TBSE.   IMarye Round, CMA, am acting as scribe for [name], MD .

## 2020-01-31 ENCOUNTER — Encounter: Payer: Self-pay | Admitting: Dermatology

## 2020-06-25 DIAGNOSIS — U071 COVID-19: Secondary | ICD-10-CM

## 2020-06-25 HISTORY — DX: COVID-19: U07.1

## 2020-12-04 ENCOUNTER — Other Ambulatory Visit: Admission: RE | Admit: 2020-12-04 | Payer: Medicare Other | Source: Ambulatory Visit

## 2020-12-04 ENCOUNTER — Encounter
Admission: RE | Admit: 2020-12-04 | Discharge: 2020-12-04 | Disposition: A | Discharge status: Home / Self Care | Payer: Medicare Other | Source: Ambulatory Visit | Attending: Urology | Admitting: Urology

## 2020-12-04 ENCOUNTER — Other Ambulatory Visit: Payer: Self-pay

## 2020-12-04 HISTORY — DX: Male erectile dysfunction, unspecified: N52.9

## 2020-12-04 HISTORY — DX: Solitary pulmonary nodule: R91.1

## 2020-12-04 NOTE — Patient Instructions (Addendum)
Your procedure is scheduled on:12-11-20 THURSDAY Report to the Registration Desk on the 1st floor of the [facility]-Then proceed to the 2nd floor Surgery Desk in the [facility] To find out your arrival time, please call [phone] between 1PM - 3PM on:12-10-20 WEDNESDAY  REMEMBER: Instructions that are not followed completely may result in serious medical risk, up to and including death; or upon the discretion of your surgeon and anesthesiologist your surgery may need to be rescheduled.  Do not eat food after midnight the night before surgery.  No gum chewing, lozengers or hard candies.  You may however, drink CLEAR liquids up to 2 hours before you are scheduled to arrive for your surgery. Do not drink anything within 2 hours of your scheduled arrival time.  Clear liquids include: - water  - apple juice without pulp - gatorade - black coffee or tea (Do NOT add milk or creamers to the coffee or tea) Do NOT drink anything that is not on this list  TAKE THESE MEDICATIONS THE MORNING OF SURGERY WITH A SIP OF WATER: -NONE  Follow recommendations from Cardiologist, Pulmonologist or PCP regarding stopping Aspirin, Coumadin, Plavix, Eliquis, Pradaxa, or Pletal-STOP YOUR 325 MG ASPIRIN NOW  One week prior to surgery: Stop Anti-inflammatories (NSAIDS) such as Advil, Aleve, Ibuprofen, Motrin, Naproxen, Naprosyn and Aspirin based products such as Excedrin, Goodys Powder, BC Powder-OK TO TAKE TYLENOL IF NEEDED  Stop ANY OVER THE COUNTER supplements until after surgery-STOP TURMERIC WITH CO Q-10 NOW-YOU MAY RESUME AFTER SURGERY (However, you may continue taking Zinc and multivitamin up until the day before surgery.)  No Alcohol for 24 hours before or after surgery.  No Smoking including e-cigarettes for 24 hours prior to surgery.  No chewable tobacco products for at least 6 hours prior to surgery.  No nicotine patches on the day of surgery.  Do not use any "recreational" drugs for at  least a week prior to your surgery.  Please be advised that the combination of cocaine and anesthesia may have negative outcomes, up to and including death. If you test positive for cocaine, your surgery will be cancelled.  On the morning of surgery brush your teeth with toothpaste and water, you may rinse your mouth with mouthwash if you wish. Do not swallow any toothpaste or mouthwash.  Do not wear jewelry, make-up, hairpins, clips or nail polish.  Do not wear lotions, powders, or perfumes.   Do not shave body from the neck down 48 hours prior to surgery just in case you cut yourself which could leave a site for infection.  Also, freshly shaved skin may become irritated if using the CHG soap.  Contact lenses, hearing aids and dentures may not be worn into surgery.  Do not bring valuables to the hospital. [facility] is not responsible for any missing/lost belongings or valuables.   Notify your doctor if there is any change in your medical condition (cold, fever, infection).  Wear comfortable clothing (specific to your surgery type) to the hospital.  Plan for stool softeners for home use; pain medications have a tendency to cause constipation. You can also help prevent constipation by eating foods high in fiber such as fruits and vegetables and drinking plenty of fluids as your diet allows.  After surgery, you can help prevent lung complications by doing breathing exercises.  Take deep breaths and cough every 1-2 hours. Your doctor may order a device called an Incentive Spirometer to help you take deep breaths. When coughing or sneezing,  hold a pillow firmly against your incision with both hands. This is called "splinting." Doing this helps protect your incision. It also decreases belly discomfort.  If you are being admitted to the hospital overnight, leave your suitcase in the car. After surgery it may be brought to your room.  If you are being discharged the day of surgery, you  will not be allowed to drive home. You will need a responsible adult (18 years or older) to drive you home and stay with you that night.   If you are taking public transportation, you will need to have a responsible adult (18 years or older) with you. Please confirm with your physician that it is acceptable to use public transportation.   Please call the [facility] at [phone] if you have any questions about these instructions.  Visitation Policy:  Patients undergoing a surgery or procedure may have one family member or support person with them as long as that person is not COVID-19 positive or experiencing its symptoms.  That person may remain in the waiting area during the procedure.  Inpatient Visitation:    Visiting hours are 7 a.m. to 8 p.m. Patients will be allowed one visitor. The visitor may change daily. The visitor must pass COVID-19 screenings, use hand sanitizer when entering and exiting the patient's room and wear a mask at all times, including in the patient's room. Patients must also wear a mask when staff or their visitor are in the room. Masking is required regardless of vaccination status. Systemwide, no visitors 17 or younger.

## 2020-12-08 ENCOUNTER — Encounter
Admission: RE | Admit: 2020-12-08 | Discharge: 2020-12-08 | Disposition: A | Discharge status: Home / Self Care | Payer: Medicare Other | Source: Ambulatory Visit | Attending: Urology | Admitting: Urology

## 2020-12-08 ENCOUNTER — Other Ambulatory Visit: Payer: Self-pay

## 2020-12-08 DIAGNOSIS — Z01818 Encounter for other preprocedural examination: Secondary | ICD-10-CM | POA: Diagnosis present

## 2020-12-09 ENCOUNTER — Other Ambulatory Visit
Admission: RE | Admit: 2020-12-09 | Discharge: 2020-12-09 | Disposition: A | Discharge status: Home / Self Care | Payer: Medicare Other | Source: Ambulatory Visit | Attending: Urology | Admitting: Urology

## 2020-12-09 DIAGNOSIS — Z01812 Encounter for preprocedural laboratory examination: Secondary | ICD-10-CM | POA: Diagnosis present

## 2020-12-09 DIAGNOSIS — Z20822 Contact with and (suspected) exposure to covid-19: Secondary | ICD-10-CM | POA: Diagnosis not present

## 2020-12-09 LAB — SARS CORONAVIRUS 2 (TAT 6-24 HRS): SARS Coronavirus 2: NEGATIVE

## 2020-12-10 NOTE — H&P (Signed)
NAME: Donald [name], [facility] MEDICAL RECORD [id number] ACCOUNT [id number] DATE OF BIRTH:[birth year] FACILITY: [facility] LOCATION: [facility] PHYSICIAN:Lile Mccurley Farrel Conners, MD  HISTORY AND PHYSICAL  DATE OF ADMISSION:  12/10/2020  Date of surgery 12/11/2020.  CHIEF COMPLAINT:  Difficulty voiding.  HISTORY OF PRESENT ILLNESS:  The patient is a 70 year old Caucasian male with a long history of BPH and lower urinary tract symptoms.  IPSS score was 17 with a quality of life score.  He is currently being managed with alfuzosin and daily tadalafil.   Evaluation in the office included Uroflow study, which revealed a peak flow rate of 13 mL per second with average flow 8.9 mL per second and a postvoid residual of 125 mL based upon a voided volume of 393 mL.  Bladder wall density was elevated at 44 g.   Prostate ultrasound indicated a 65.5 mL prostate without hypoechoic lesions and no median lobe.  Most recent PSA was 1.4 ng/mL on 01/12.  Cystoscopy on 11/25/2020 indicated lateral lobe obstructing BPH with a 3 cm prostatic urethral length.  The patient  comes in now for UroLift procedure.  ALLERGIES:  No drug allergies.  CURRENT MEDICATIONS:  Included aspirin, multivitamins, curcumin, vitamin E, L-arginine, alfuzosin, tadalafil, and sildenafil.  He also uses testosterone cream 20% 1 mL daily for hypogonadism.  PAST SURGICAL HISTORY: 1.  Left inguinal herniorrhaphy with orchiopexy in 1972. 2.  Vasectomy in 1978. 3.  Repair of fractured right ankle in 1976. 4.  Lumbar laminectomy in 1984. 5.  Left knee replacement in 2012. 6.  Right knee replacement in 2019. 7.  Right hip replacement in 2017.  PAST AND CURRENT MEDICAL CONDITIONS: 1.  Hypogonadism. 2.  Erectile dysfunction. 3.  Degenerative joint disease. 4.  Testicular atrophy.  REVIEW OF SYSTEMS:  The patient denies chest pain, shortness of breath, diabetes, stroke, or heart disease.  PHYSICAL EXAMINATION: VITAL SIGNS:  Height 5 feet 11 inches,  weight 220 pounds, BMI 31. GENERAL:  Obese white male in no acute distress. HEENT:  Sclerae were clear. NECK:  No palpable thyroid nodules. LYMPHATIC:  No palpable cervical or inguinal adenopathy. ABDOMEN:  Soft, nontender abdomen.  No CVA tenderness. GENITOURINARY:  The patient was circumcised.  Right testis was smooth, nontender, 20 mL in size.  Left testis is atrophic, approximately 10 mL in size. RECTAL:  Greater than 40 g, smooth, nontender prostate. NEUROLOGIC:  Alert and oriented x3.  IMPRESSION:  Benign prostatic hypertrophy with bladder outlet obstruction.  PLAN:  UroLift procedure.  IN/NUANCE  D:12/05/2020 T:12/05/2020 JOB:[id number]

## 2020-12-11 ENCOUNTER — Ambulatory Visit
Admission: RE | Admit: 2020-12-11 | Discharge: 2020-12-11 | Disposition: A | Discharge status: Home / Self Care | Payer: Medicare Other | Source: Ambulatory Visit | Attending: Urology | Admitting: Urology

## 2020-12-11 ENCOUNTER — Ambulatory Visit: Payer: Medicare Other | Admitting: Anesthesiology

## 2020-12-11 ENCOUNTER — Encounter: Payer: Self-pay | Admitting: Urology

## 2020-12-11 ENCOUNTER — Other Ambulatory Visit: Payer: Self-pay

## 2020-12-11 ENCOUNTER — Encounter
Admission: RE | Disposition: A | Discharge status: Home / Self Care | Payer: Self-pay | Source: Ambulatory Visit | Attending: Urology

## 2020-12-11 DIAGNOSIS — Z79899 Other long term (current) drug therapy: Secondary | ICD-10-CM | POA: Insufficient documentation

## 2020-12-11 DIAGNOSIS — Z7982 Long term (current) use of aspirin: Secondary | ICD-10-CM | POA: Diagnosis not present

## 2020-12-11 DIAGNOSIS — Z85828 Personal history of other malignant neoplasm of skin: Secondary | ICD-10-CM | POA: Diagnosis not present

## 2020-12-11 DIAGNOSIS — N320 Bladder-neck obstruction: Secondary | ICD-10-CM | POA: Insufficient documentation

## 2020-12-11 DIAGNOSIS — Z96641 Presence of right artificial hip joint: Secondary | ICD-10-CM | POA: Insufficient documentation

## 2020-12-11 DIAGNOSIS — Z8616 Personal history of COVID-19: Secondary | ICD-10-CM | POA: Diagnosis not present

## 2020-12-11 DIAGNOSIS — Z96653 Presence of artificial knee joint, bilateral: Secondary | ICD-10-CM | POA: Diagnosis not present

## 2020-12-11 DIAGNOSIS — N401 Enlarged prostate with lower urinary tract symptoms: Secondary | ICD-10-CM | POA: Diagnosis present

## 2020-12-11 HISTORY — PX: CYSTOSCOPY WITH INSERTION OF UROLIFT: SHX6678

## 2020-12-11 SURGERY — CYSTOSCOPY WITH INSERTION OF UROLIFT
Anesthesia: General

## 2020-12-11 MED ORDER — PROPOFOL 10 MG/ML IV BOLUS
INTRAVENOUS | Status: AC
  Filled 2020-12-11: qty 20

## 2020-12-11 MED ORDER — ORAL CARE MOUTH RINSE: 15.0000 mL | Freq: Once | OROMUCOSAL | Status: AC

## 2020-12-11 MED ORDER — LIDOCAINE HCL URETHRAL/MUCOSAL 2 % EX GEL
CUTANEOUS | Status: AC
  Filled 2020-12-11: qty 10

## 2020-12-11 MED ORDER — LACTATED RINGERS IV SOLN: INTRAVENOUS | Status: DC

## 2020-12-11 MED ORDER — ACETAMINOPHEN 325 MG PO TABS: 325.0000 mg | ORAL_TABLET | ORAL | Status: DC | PRN

## 2020-12-11 MED ORDER — ONDANSETRON HCL 4 MG/2ML IJ SOLN
INTRAMUSCULAR | Status: DC | PRN
  Administered 2020-12-11: 4 mg via INTRAVENOUS

## 2020-12-11 MED ORDER — ONDANSETRON HCL 4 MG/2ML IJ SOLN
INTRAMUSCULAR | Status: AC
  Filled 2020-12-11: qty 2

## 2020-12-11 MED ORDER — CEFAZOLIN SODIUM-DEXTROSE 1-4 GM/50ML-% IV SOLN
1.0000 g | Freq: Once | INTRAVENOUS | Status: AC
  Administered 2020-12-11: 1 g via INTRAVENOUS

## 2020-12-11 MED ORDER — CHLORHEXIDINE GLUCONATE 0.12 % MT SOLN
OROMUCOSAL | Status: AC
  Administered 2020-12-11: 15 mL via OROMUCOSAL
  Filled 2020-12-11: qty 15

## 2020-12-11 MED ORDER — CIPROFLOXACIN HCL 500 MG PO TABS: 500.0000 mg | ORAL_TABLET | Freq: Two times a day (BID) | ORAL | 0 refills | Status: DC

## 2020-12-11 MED ORDER — FAMOTIDINE 20 MG PO TABS: 20.0000 mg | ORAL_TABLET | Freq: Once | ORAL | Status: AC

## 2020-12-11 MED ORDER — PROPOFOL 10 MG/ML IV BOLUS
INTRAVENOUS | Status: DC | PRN
  Administered 2020-12-11: 50 mg via INTRAVENOUS
  Administered 2020-12-11: 150 mg via INTRAVENOUS

## 2020-12-11 MED ORDER — FENTANYL CITRATE (PF) 100 MCG/2ML IJ SOLN
INTRAMUSCULAR | Status: DC | PRN
  Administered 2020-12-11 (×4): 25 ug via INTRAVENOUS

## 2020-12-11 MED ORDER — DEXAMETHASONE SODIUM PHOSPHATE 10 MG/ML IJ SOLN
INTRAMUSCULAR | Status: AC
  Filled 2020-12-11: qty 1

## 2020-12-11 MED ORDER — HYDROCODONE-ACETAMINOPHEN 7.5-325 MG PO TABS: 1.0000 | ORAL_TABLET | Freq: Once | ORAL | Status: DC | PRN

## 2020-12-11 MED ORDER — CEFAZOLIN SODIUM-DEXTROSE 1-4 GM/50ML-% IV SOLN
INTRAVENOUS | Status: AC
  Filled 2020-12-11: qty 50

## 2020-12-11 MED ORDER — DEXAMETHASONE SODIUM PHOSPHATE 10 MG/ML IJ SOLN
INTRAMUSCULAR | Status: DC | PRN
  Administered 2020-12-11: 8 mg via INTRAVENOUS

## 2020-12-11 MED ORDER — LIDOCAINE HCL (CARDIAC) PF 100 MG/5ML IV SOSY
PREFILLED_SYRINGE | INTRAVENOUS | Status: DC | PRN
  Administered 2020-12-11: 100 mg via INTRAVENOUS

## 2020-12-11 MED ORDER — CHLORHEXIDINE GLUCONATE 0.12 % MT SOLN: 15.0000 mL | Freq: Once | OROMUCOSAL | Status: AC

## 2020-12-11 MED ORDER — HYOSCYAMINE SULFATE SL 0.125 MG SL SUBL: 0.1250 mg | SUBLINGUAL_TABLET | SUBLINGUAL | 3 refills | Status: DC | PRN

## 2020-12-11 MED ORDER — FAMOTIDINE 20 MG PO TABS
ORAL_TABLET | ORAL | Status: AC
  Administered 2020-12-11: 20 mg via ORAL
  Filled 2020-12-11: qty 1

## 2020-12-11 MED ORDER — ACETAMINOPHEN 10 MG/ML IV SOLN
INTRAVENOUS | Status: DC | PRN
  Administered 2020-12-11: 1000 mg via INTRAVENOUS

## 2020-12-11 MED ORDER — URIBEL 118 MG PO CAPS: 1.0000 | ORAL_CAPSULE | Freq: Four times a day (QID) | ORAL | 3 refills | Status: DC | PRN

## 2020-12-11 MED ORDER — LIDOCAINE HCL (PF) 2 % IJ SOLN
INTRAMUSCULAR | Status: AC
  Filled 2020-12-11: qty 5

## 2020-12-11 MED ORDER — ONDANSETRON HCL 4 MG/2ML IJ SOLN: 4.0000 mg | Freq: Once | INTRAMUSCULAR | Status: DC | PRN

## 2020-12-11 MED ORDER — FENTANYL CITRATE (PF) 100 MCG/2ML IJ SOLN
INTRAMUSCULAR | Status: AC
  Filled 2020-12-11: qty 2

## 2020-12-11 MED ORDER — SUCCINYLCHOLINE CHLORIDE 200 MG/10ML IV SOSY
PREFILLED_SYRINGE | INTRAVENOUS | Status: AC
  Filled 2020-12-11: qty 20

## 2020-12-11 MED ORDER — ACETAMINOPHEN 10 MG/ML IV SOLN
INTRAVENOUS | Status: AC
  Filled 2020-12-11: qty 100

## 2020-12-11 MED ORDER — LIDOCAINE HCL URETHRAL/MUCOSAL 2 % EX GEL
CUTANEOUS | Status: DC | PRN
  Administered 2020-12-11: 1

## 2020-12-11 MED ORDER — ACETAMINOPHEN 160 MG/5ML PO SOLN
325.0000 mg | ORAL | Status: DC | PRN
  Filled 2020-12-11: qty 20.3

## 2020-12-11 SURGICAL SUPPLY — 13 items
BAG DRAIN CYSTO-URO LG1000N (MISCELLANEOUS) ×2 IMPLANT
GLOVE SURG ENC MOIS LTX SZ7.5 (GLOVE) ×2 IMPLANT
GOWN STRL REUS W/ TWL LRG LVL3 (GOWN DISPOSABLE) ×1 IMPLANT
GOWN STRL REUS W/ TWL XL LVL3 (GOWN DISPOSABLE) ×1 IMPLANT
GOWN STRL REUS W/TWL LRG LVL3 (GOWN DISPOSABLE) ×2
GOWN STRL REUS W/TWL XL LVL3 (GOWN DISPOSABLE) ×2
KIT TURNOVER CYSTO (KITS) ×2 IMPLANT
PACK CYSTO AR (MISCELLANEOUS) ×2 IMPLANT
SET CYSTO W/LG BORE CLAMP LF (SET/KITS/TRAYS/PACK) ×2 IMPLANT
SURGILUBE 2OZ TUBE FLIPTOP (MISCELLANEOUS) ×2 IMPLANT
SYSTEM UROLIFT (Male Continence) ×8 IMPLANT
WATER STERILE IRR 1000ML POUR (IV SOLUTION) ×2 IMPLANT
WATER STERILE IRR 3000ML UROMA (IV SOLUTION) ×2 IMPLANT

## 2020-12-11 NOTE — Anesthesia Procedure Notes (Addendum)
Procedure Name: LMA Insertion Date/Time: 12/11/2020 7:37 AM Performed by: Hedda Slade, CRNA Pre-anesthesia Checklist: Patient identified, Patient being monitored, Timeout performed, Emergency Drugs available and Suction available Patient Re-evaluated:Patient Re-evaluated prior to induction Oxygen Delivery Method: Circle system utilized Preoxygenation: Pre-oxygenation with 100% oxygen Induction Type: IV induction Ventilation: Mask ventilation without difficulty LMA: LMA inserted LMA Size: 5.0 Tube type: Oral Number of attempts: 2 Placement Confirmation: positive ETCO2 and breath sounds checked- equal and bilateral Tube secured with: Tape Dental Injury: Teeth and Oropharynx as per pre-operative assessment  Comments: First attempt with LMA 4.5 with leak/inadequate Vt.  Better seal achieved with LMA 5

## 2020-12-11 NOTE — Discharge Instructions (Addendum)
Cystoscopy Cystoscopy is a procedure that is used to help diagnose and sometimes treat conditions that affect the lower urinary tract. The lower urinary tract includes the bladder and the urethra. The urethra is the tube that drains urine from the bladder. Cystoscopy is done using a thin, tube-shaped instrument with a light and camera at the end (cystoscope). The cystoscope may be hard or flexible, depending on the goal of the procedure. The cystoscope is inserted through the urethra, into the bladder. Cystoscopy may be recommended if you have:  Urinary tract infections that keep coming back.  Blood in the urine (hematuria).  An inability to control when you urinate (urinary incontinence) or an overactive bladder.  Unusual cells found in a urine sample.  A blockage in the urethra, such as a urinary stone.  Painful urination.  An abnormality in the bladder found during an intravenous pyelogram (IVP) or CT scan. Cystoscopy may also be done to remove a sample of tissue to be examined under a microscope (biopsy). Tell a health care provider about:  Any allergies you have.  All medicines you are taking, including vitamins, herbs, eye drops, creams, and over-the-counter medicines.  Any problems you or family members have had with anesthetic medicines.  Any blood disorders you have.  Any surgeries you have had.  Any medical conditions you have.  Whether you are pregnant or may be pregnant. What are the risks? Generally, this is a safe procedure. However, problems may occur, including:  Infection.  Bleeding.  Allergic reactions to medicines.  Damage to other structures or organs. What happens before the procedure? Medicines Ask your health care provider about:  Changing or stopping your regular medicines. This is especially important if you are taking diabetes medicines or blood thinners.  Taking medicines such as aspirin and ibuprofen. These medicines can thin your blood. Do  not take these medicines unless your health care provider tells you to take them.  Taking over-the-counter medicines, vitamins, herbs, and supplements. Tests You may have an exam or testing, such as:  X-rays of the bladder, urethra, or kidneys.  CT scan of the abdomen or pelvis.  Urine tests to check for signs of infection. General instructions  Follow instructions from your health care provider about eating or drinking restrictions.  Ask your health care provider what steps will be taken to help prevent infection. These steps may include: ? Washing skin with a germ-killing soap. ? Taking antibiotic medicine.  Plan to have a responsible adult take you home from the hospital or clinic. What happens during the procedure?  You will be given one or more of the following: ? A medicine to help you relax (sedative). ? A medicine to numb the area (local anesthetic).  The area around the opening of your urethra will be cleaned.  The cystoscope will be passed through your urethra into your bladder.  Germ-free (sterile) fluid will flow through the cystoscope to fill your bladder. The fluid will stretch your bladder so that your health care provider can clearly examine your bladder walls.  Your doctor will look at the urethra and bladder. Your doctor may take a biopsy or remove stones.  The cystoscope will be removed, and your bladder will be emptied. The procedure may vary among health care providers and hospitals.   What can I expect after the procedure? After the procedure, it is common to have:  Some soreness or pain in your abdomen and urethra.  Urinary symptoms. These include: ? Mild pain or burning   when you urinate. Pain should stop within a few minutes after you urinate. This may last for up to 1 week. ? A small amount of blood in your urine for several days. ? Feeling like you need to urinate but producing only a small amount of urine. Follow these instructions at  home: Medicines  Take over-the-counter and prescription medicines only as told by your health care provider.  If you were prescribed an antibiotic medicine, take it as told by your health care provider. Do not stop taking the antibiotic even if you start to feel better. General instructions  Return to your normal activities as told by your health care provider. Ask your health care provider what activities are safe for you.  If you were given a sedative during the procedure, it can affect you for several hours. Do not drive or operate machinery until your health care provider says that it is safe.  Watch for any blood in your urine. If the amount of blood in your urine increases, call your health care provider.  Follow instructions from your health care provider about eating or drinking restrictions.  If a tissue sample was removed for testing (biopsy) during your procedure, it is up to you to get your test results. Ask your health care provider, or the department that is doing the test, when your results will be ready.  Drink enough fluid to keep your urine pale yellow.  Keep all follow-up visits. This is important. Contact a health care provider if:  You have pain that gets worse or does not get better with medicine, especially pain when you urinate.  You have trouble urinating.  You have more blood in your urine. Get help right away if:  You have blood clots in your urine.  You have abdominal pain.  You have a fever or chills.  You are unable to urinate. Summary  Cystoscopy is a procedure that is used to help diagnose and sometimes treat conditions that affect the lower urinary tract.  Cystoscopy is done using a thin, tube-shaped instrument with a light and camera at the end.  After the procedure, it is common to have some soreness or pain in your abdomen and urethra.  Watch for any blood in your urine. If the amount of blood in your urine increases, call your health  care provider.  If you were prescribed an antibiotic medicine, take it as told by your health care provider. Do not stop taking the antibiotic even if you start to feel better. This information is not intended to replace advice given to you by your health care provider. Make sure you discuss any questions you have with your health care provider. Document Revised: 05/23/2020 Document Reviewed: 05/23/2020 Elsevier Patient Education  2021 [employer] AMBULATORY SURGERY  DISCHARGE INSTRUCTIONS   1) The drugs that you were given will stay in your system until tomorrow so for the next 24 hours you should not:  A) Drive an automobile B) Make any legal decisions C) Drink any alcoholic beverage   2) You may resume regular meals tomorrow.  Today it is better to start with liquids and gradually work up to solid foods.  You may eat anything you prefer, but it is better to start with liquids, then soup and crackers, and gradually work up to solid foods.   3) Please notify your doctor immediately if you have any unusual bleeding, trouble breathing, redness and pain at the surgery site, drainage, fever, or pain not relieved by   medication.    4) Additional Instructions:        Please contact your physician with any problems or Same Day Surgery at [phone], Monday through Friday 6 am to 4 pm, or [facility] [place] Main number at [phone]. 

## 2020-12-11 NOTE — H&P (Signed)
Date of Initial H&P: 12/10/20  History reviewed, patient examined, no change in status, stable for surgery.

## 2020-12-11 NOTE — Transfer of Care (Signed)
Immediate Anesthesia Transfer of Care Note  Patient: Donald [name]  Procedure(s) Performed: CYSTOSCOPY WITH INSERTION OF UROLIFT (N/A )  Patient Location: PACU  Anesthesia Type:General  Level of Consciousness: sedated  Airway & Oxygen Therapy: Patient Spontanous Breathing and Patient connected to face mask oxygen  Post-op Assessment: Report given to RN and Post -op Vital signs reviewed and stable  Post vital signs: Reviewed and stable  Last Vitals:  Vitals Value Taken Time  BP 117/63 12/11/20 0805  Temp 36.7 C 12/11/20 0805  Pulse 74 12/11/20 0807  Resp 18 12/11/20 0807  SpO2 99 % 12/11/20 0807  Vitals shown include unvalidated device data.  Last Pain:  Vitals:   12/11/20 0635  TempSrc: Oral  PainSc: 0-No pain         Complications: No complications documented.

## 2020-12-11 NOTE — Anesthesia Preprocedure Evaluation (Signed)
Anesthesia Evaluation  Patient identified by MRN, date of birth, ID band Patient awake    Reviewed: Allergy & Precautions, H&P , NPO status , reviewed documented beta blocker date and time   History of Anesthesia Complications (+) PONV and history of anesthetic complications  Airway Mallampati: III  TM Distance: >3 FB Neck ROM: full    Dental  (+) Poor Dentition, Chipped, Missing   Pulmonary    Pulmonary exam normal        Cardiovascular Normal cardiovascular exam  1 AVb, no sig change on EKG from prior   Neuro/Psych    GI/Hepatic neg GERD  ,  Endo/Other    Renal/GU      Musculoskeletal  (+) Arthritis ,   Abdominal   Peds  Hematology   Anesthesia Other Findings Past Medical History: No date: Arthritis 02/23/2008: Basal cell carcinoma     Comment:  Left shoulder.  06/2020: COVID-19 No date: ED (erectile dysfunction) 01/24/2017: Hx of basal cell carcinoma     Comment:  Left lateral canthus. Nodular pattern. Tx: EDC No date: Hyperlipidemia     Comment:  history of elevated cholesterol, better after pt started              exercising. No date: Lung nodule     Comment:  SEES ALESKEROV No date: PONV (postoperative nausea and vomiting)     Comment:  40 years ago with hernia surgery. Past Surgical History: 1984: BACK SURGERY     Comment:  L4-5 06/03/2015: CATARACT EXTRACTION W/PHACO; Right     Comment:  Procedure: CATARACT EXTRACTION PHACO AND INTRAOCULAR               LENS PLACEMENT (IOC);  Surgeon: Birder Robson, MD;                Location: [facility] ORS;  Service: Ophthalmology;  Laterality:              Right;  [place]: 01:26.5AP%: 19.7CDE: 17.07Fluid lot#               [id number] H 08/16/2016: COLONOSCOPY; N/A     Comment:  Procedure: COLONOSCOPY;  Surgeon: Lollie Sails, MD;              Location: [facility] ENDOSCOPY;  Service: Endoscopy;                Laterality: N/A; 1976: FRACTURE SURGERY; Right     Comment:   ankle 1972: HERNIA REPAIR; Left     Comment:  nausea and vomiting with general anesthesia 2012: JOINT REPLACEMENT; Left 2014: JOINT REPLACEMENT; Right No date: knees replaced 12/08/2016: TOTAL HIP ARTHROPLASTY; Right     Comment:  Procedure: TOTAL HIP ARTHROPLASTY;  Surgeon: Dereck Leep, MD;  Location: [facility] ORS;  Service: Orthopedics;                Laterality: Right; BMI    Body Mass Index: 32.55 kg/m     Reproductive/Obstetrics                             Anesthesia Physical Anesthesia Plan  ASA: II  Anesthesia Plan: General LMA   Post-op Pain Management:    Induction: Intravenous  PONV Risk Score and Plan: Ondansetron and Treatment may vary due to age or medical condition  Airway Management Planned: LMA  Additional Equipment:   Intra-op Plan:  Post-operative Plan: Extubation in OR  Informed Consent: I have reviewed the patients History and Physical, chart, labs and discussed the procedure including the risks, benefits and alternatives for the proposed anesthesia with the patient or authorized representative who has indicated his/her understanding and acceptance.     Dental Advisory Given  Plan Discussed with: CRNA  Anesthesia Plan Comments:         Anesthesia Quick Evaluation

## 2020-12-11 NOTE — Op Note (Signed)
Preoperative diagnosis: BPH with bladder outlet obstruction (N40.1)  Postoperative diagnosis: Same  Procedure: UroLift  ([id number] and [id number] X 3)   Surgeon: Otelia Limes. Yves Dill MD  Anesthesia: General  Indications:See the history and physical. After informed consent the above procedure(s) were requested     Technique and findings:  Patient was brought to the cystoscopy suite and placed into dorsal lithotomy position.The perineum was then sterilely prepped and draped in the usual fashion.  Cystoscopy was performed, and patient was found to have lateral lobe obstruction with no evidence of a median lobe. The 1st pair of implants were placed at the bladder neck 1.5 - 2cm from the bladder neck. The 2nd pair of implants were placed at the level of the verumontanum.  Cystoscopy was performed at completion, and the prostatic urethra was widely patent and no injury to the bladder or urethra was detected.  The cystoscope was removed and 10 cc of viscous Xylocaine instilled within the urethra.  The patient tolerated the procedure well and was transferred to the recovery room in stable condition.  Blood loss was minimal.

## 2020-12-11 NOTE — Anesthesia Postprocedure Evaluation (Signed)
Anesthesia Post Note  Patient: Donald [name]  Procedure(s) Performed: CYSTOSCOPY WITH INSERTION OF UROLIFT (N/A )  Patient location during evaluation: PACU Anesthesia Type: General Level of consciousness: awake and alert Pain management: pain level controlled Vital Signs Assessment: post-procedure vital signs reviewed and stable Respiratory status: spontaneous breathing, nonlabored ventilation and respiratory function stable Cardiovascular status: blood pressure returned to baseline and stable Postop Assessment: no apparent nausea or vomiting Anesthetic complications: no   No complications documented.   Last Vitals:  Vitals:   12/11/20 0850 12/11/20 0919  BP: (!) 143/67 (!) 146/66  Pulse: 69 65  Resp: 16 15  Temp: (!) 36.4 C (!) 36.1 C  SpO2: 99% 100%    Last Pain:  Vitals:   12/11/20 0919  TempSrc: Temporal  PainSc: 0-No pain                 Alphonsus Sias

## 2020-12-12 ENCOUNTER — Encounter: Payer: Self-pay | Admitting: Urology

## 2021-01-29 ENCOUNTER — Encounter: Payer: Medicare Other | Admitting: Dermatology

## 2021-02-25 ENCOUNTER — Other Ambulatory Visit: Payer: Self-pay

## 2021-02-25 ENCOUNTER — Ambulatory Visit: Payer: Medicare Other | Admitting: Dermatology

## 2021-02-25 DIAGNOSIS — Z85828 Personal history of other malignant neoplasm of skin: Secondary | ICD-10-CM

## 2021-02-25 DIAGNOSIS — Z1283 Encounter for screening for malignant neoplasm of skin: Secondary | ICD-10-CM

## 2021-02-25 DIAGNOSIS — L7211 Pilar cyst: Secondary | ICD-10-CM | POA: Diagnosis not present

## 2021-02-25 DIAGNOSIS — B353 Tinea pedis: Secondary | ICD-10-CM

## 2021-02-25 DIAGNOSIS — L814 Other melanin hyperpigmentation: Secondary | ICD-10-CM

## 2021-02-25 DIAGNOSIS — D229 Melanocytic nevi, unspecified: Secondary | ICD-10-CM

## 2021-02-25 DIAGNOSIS — D0339 Melanoma in situ of other parts of face: Secondary | ICD-10-CM | POA: Diagnosis not present

## 2021-02-25 DIAGNOSIS — C439 Malignant melanoma of skin, unspecified: Secondary | ICD-10-CM

## 2021-02-25 DIAGNOSIS — D492 Neoplasm of unspecified behavior of bone, soft tissue, and skin: Secondary | ICD-10-CM

## 2021-02-25 DIAGNOSIS — L821 Other seborrheic keratosis: Secondary | ICD-10-CM

## 2021-02-25 DIAGNOSIS — D1800 Hemangioma unspecified site: Secondary | ICD-10-CM

## 2021-02-25 DIAGNOSIS — L578 Other skin changes due to chronic exposure to nonionizing radiation: Secondary | ICD-10-CM

## 2021-02-25 DIAGNOSIS — L820 Inflamed seborrheic keratosis: Secondary | ICD-10-CM

## 2021-02-25 HISTORY — DX: Malignant melanoma of skin, unspecified: C43.9

## 2021-02-25 MED ORDER — KETOCONAZOLE 2 % EX CREA: TOPICAL_CREAM | CUTANEOUS | 1 refills | Status: DC

## 2021-02-25 NOTE — Progress Notes (Signed)
Follow-Up Visit   Subjective  Donald [name] is a 70 y.o. male who presents for the following: Annual Exam (Here for annual skin cancer screening. Full body. Dur: 1 year. HxBCC's. ). The patient presents for Total-Body Skin Exam (TBSE) for skin cancer screening and mole check.  The following portions of the chart were reviewed this encounter and updated as appropriate:  Tobacco  Allergies  Meds  Problems  Med Hx  Surg Hx  Fam Hx     Review of Systems: No other skin or systemic complaints except as noted in HPI or Assessment and Plan.  Objective  Well appearing patient in no apparent distress; mood and affect are within normal limits.  A full examination was performed including scalp, head, eyes, ears, nose, lips, neck, chest, axillae, abdomen, back, buttocks, bilateral upper extremities, bilateral lower extremities, hands, feet, fingers, toes, fingernails, and toenails. All findings within normal limits unless otherwise noted below.  Objective  left crown scalp: 0.5cm cystic papule  Objective  Left Forehead: 1.5cm irregular brown macule     Objective  Left Anterior Neck: Erythematous keratotic or waxy stuck-on papule or plaque.   Objective  Right Foot: Scaling and maceration web spaces and over distal and lateral soles.   Assessment & Plan  Pilar cyst left crown scalp Reassured benign growth.  Recommend observation.  Discussed surgical excision in office if changes noted or symptomatic.   Neoplasm of skin Left Forehead Skin / nail biopsy Type of biopsy: tangential   Informed consent: discussed and consent obtained   Timeout: patient name, date of birth, surgical site, and procedure verified   Procedure prep:  Patient was prepped and draped in usual sterile fashion Prep type:  Isopropyl alcohol Anesthesia: the lesion was anesthetized in a standard fashion   Anesthetic:  1% lidocaine w/ epinephrine 1-100,000 buffered w/ 8.4% NaHCO3 Instrument used: flexible  razor blade   Hemostasis achieved with: pressure, aluminum chloride and electrodesiccation   Outcome: patient tolerated procedure well   Post-procedure details: sterile dressing applied and wound care instructions given   Dressing type: bandage and petrolatum    Specimen 1 - Surgical pathology Differential Diagnosis: Lentigo vs early SK vs LM  Check Margins: No 1.5cm irregular brown macule  Inflamed seborrheic keratosis Left Anterior Neck Destruction of lesion - Left Anterior Neck Complexity: simple   Destruction method: cryotherapy   Informed consent: discussed and consent obtained   Timeout:  patient name, date of birth, surgical site, and procedure verified Lesion destroyed using liquid nitrogen: Yes   Region frozen until ice ball extended beyond lesion: Yes   Outcome: patient tolerated procedure well with no complications   Post-procedure details: wound care instructions given    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 - Call for any changes  Seborrheic Keratoses - Stuck-on, waxy, tan-brown papules and/or plaques  - Benign-appearing - Discussed benign etiology and prognosis. - Observe - Call for any changes  Melanocytic Nevi - Tan-brown and/or pink-flesh-colored symmetric macules and papules - Benign appearing on exam today - Observation - Call clinic for new or changing moles - Recommend daily use of broad spectrum spf 30+ sunscreen to sun-exposed areas.  Hemangiomas - Red papules - Discussed benign nature - Observe - Call for any changes  Actinic Damage - Chronic condition, secondary to cumulative UV/sun exposure - diffuse scaly erythematous  macules with underlying dyspigmentation - Recommend daily broad spectrum sunscreen SPF 30+ to sun-exposed areas, reapply every 2 hours as needed.  - Staying in the shade or wearing long sleeves, sun glasses (UVA+UVB protection) and wide brim hats (4-inch brim around the entire circumference of the hat) are also recommended for sun protection.  - Call for new or changing lesions.  Skin cancer screening performed today.  History of Basal Cell Carcinoma of the Skin - No evidence of recurrence today at left shoulder and left lateral canthus.  - Recommend regular full body skin exams - Recommend daily broad spectrum sunscreen SPF 30+ to sun-exposed areas, reapply every 2 hours as needed.  - Call if any new or changing lesions are noted between office visits   Return in about 1 year (around 02/25/2022) for TBSE.   I, Emelia Salisbury, CMA, am acting as scribe for [name], MD.  Documentation: I have reviewed the above documentation for accuracy and completeness, and I agree with the above.  [name], MD

## 2021-02-25 NOTE — Patient Instructions (Addendum)
Melanoma ABCDEs  Melanoma is the most dangerous type of skin cancer, and is the leading cause of death from skin disease.  You are more likely to develop melanoma if you:  Have light-colored skin, light-colored eyes, or red or blond hair  Spend a lot of time in the sun  Tan regularly, either outdoors or in a tanning bed  Have had blistering sunburns, especially during childhood  Have a close family member who has had a melanoma  Have atypical moles or large birthmarks  Early detection of melanoma is key since treatment is typically straightforward and cure rates are extremely high if we catch it early.   The first sign of melanoma is often a change in a mole or a new dark spot.  The ABCDE system is a way of remembering the signs of melanoma.  A for asymmetry:  The two halves do not match. B for border:  The edges of the growth are irregular. C for color:  A mixture of colors are present instead of an even brown color. D for diameter:  Melanomas are usually (but not always) greater than 90mm - the size of a pencil eraser. E for evolution:  The spot keeps changing in size, shape, and color.  Please check your skin once per month between visits. You can use a small mirror in front and a large mirror behind you to keep an eye on the back side or your body.   If you see any new or changing lesions before your next follow-up, please call to schedule a visit.  Please continue daily skin protection including broad spectrum sunscreen SPF 30+ to sun-exposed areas, reapplying every 2 hours as needed when you're outdoors.   Staying in the shade or wearing long sleeves, sun glasses (UVA+UVB protection) and wide brim hats (4-inch brim around the entire circumference of the hat) are also recommended for sun protection.   Wound Care Instructions  1. Cleanse wound gently with soap and water once a day then pat dry with clean gauze. Apply a thing coat of Petrolatum (petroleum jelly, "Vaseline") over  the wound (unless you have an allergy to this). We recommend that you use a new, sterile tube of Vaseline. Do not pick or remove scabs. Do not remove the yellow or white "healing tissue" from the base of the wound.  2. Cover the wound with fresh, clean, nonstick gauze and secure with paper tape. You may use Band-Aids in place of gauze and tape if the would is small enough, but would recommend trimming much of the tape off as there is often too much. Sometimes Band-Aids can irritate the skin.  3. You should call the office for your biopsy report after 1 week if you have not already been contacted.  4. If you experience any problems, such as abnormal amounts of bleeding, swelling, significant bruising, significant pain, or evidence of infection, please call the office immediately.  5. FOR ADULT SURGERY PATIENTS: If you need something for pain relief you may take 1 extra strength Tylenol (acetaminophen) AND 2 Ibuprofen (200mg  each) together every 4 hours as needed for pain. (do not take these if you are allergic to them or if you have a reason you should not take them.) Typically, you may only need pain medication for 1 to 3 days.    If you have any questions or concerns for your doctor, please call our main line at [phone] and press option 4 to reach your doctor's medical assistant. If no  one answers, please leave a voicemail as directed and we will return your call as soon as possible. Messages left after 4 pm will be answered the following business day.   You may also send [place] a message via [place]. We typically respond to MyChart messages within 1-2 business days.  For prescription refills, please ask your pharmacy to contact our office. Our fax number is [phone].  If you have an urgent issue when the clinic is closed that cannot wait until the next business day, you can page your doctor at the number below.    Please note that while we do our best to be available for urgent issues  outside of office hours, we are not available 24/7.   If you have an urgent issue and are unable to reach [place], you may choose to seek medical care at your doctor's office, retail clinic, urgent care center, or emergency room.  If you have a medical emergency, please immediately call 911 or go to the emergency department.  Pager Numbers  - Dr. Nehemiah Massed: [phone]  - Dr. Laurence Ferrari: [phone]  - Dr. Nicole Kindred: [phone]  In the event of inclement weather, please call our main line at [phone] for an update on the status of any delays or closures.  Dermatology Medication Tips: Please keep the boxes that topical medications come in in order to help keep track of the instructions about where and how to use these. Pharmacies typically print the medication instructions only on the boxes and not directly on the medication tubes.   If your medication is too expensive, please contact our office at [phone] option 4 or send [place] a message through [place].   We are unable to tell what your co-pay for medications will be in advance as this is different depending on your insurance coverage. However, we may be able to find a substitute medication at lower cost or fill out paperwork to get insurance to cover a needed medication.   If a prior authorization is required to get your medication covered by your insurance company, please allow [place] 1-2 business days to complete this process.  Drug prices often vary depending on where the prescription is filled and some pharmacies may offer cheaper prices.  The website www.goodrx.com contains coupons for medications through different pharmacies. The prices here do not account for what the cost may be with help from insurance (it may be cheaper with your insurance), but the website can give you the price if you did not use any insurance.  - You can print the associated coupon and take it with your prescription to the pharmacy.  - You may also stop by our  office during regular business hours and pick up a GoodRx coupon card.  - If you need your prescription sent electronically to a different pharmacy, notify our office through [facility] or by phone at [phone] option 4.

## 2021-02-27 ENCOUNTER — Encounter: Payer: Self-pay | Admitting: Dermatology

## 2021-03-03 ENCOUNTER — Telehealth: Payer: Self-pay

## 2021-03-03 NOTE — Telephone Encounter (Signed)
Patient informed of pathology results and appointment scheduled for 03/04/21 at 8:30 am.

## 2021-03-03 NOTE — Telephone Encounter (Signed)
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

## 2021-03-04 ENCOUNTER — Encounter: Payer: Self-pay | Admitting: Dermatology

## 2021-03-04 ENCOUNTER — Ambulatory Visit: Payer: Medicare Other | Admitting: Dermatology

## 2021-03-04 ENCOUNTER — Other Ambulatory Visit: Payer: Self-pay

## 2021-03-04 DIAGNOSIS — D0330 Melanoma in situ of unspecified part of face: Secondary | ICD-10-CM

## 2021-03-04 DIAGNOSIS — D0339 Melanoma in situ of other parts of face: Secondary | ICD-10-CM

## 2021-03-04 NOTE — Patient Instructions (Addendum)

## 2021-03-04 NOTE — Progress Notes (Signed)
Follow-Up Visit   Subjective  Donald [name] is a 70 y.o. 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  The following portions of the chart were reviewed this encounter and updated as appropriate:   Tobacco  Allergies  Meds  Problems  Med Hx  Surg Hx  Fam Hx     Review of Systems:  No other skin or systemic complaints except as noted in HPI or Assessment and Plan.  Objective  Well appearing patient in no apparent distress; mood and affect are within normal limits.  A focused examination was performed including face. Relevant physical exam findings are noted in the Assessment and Plan.  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 L forehead x 1  Destruction of lesion Complexity: simple   Destruction method: cryotherapy   Informed consent: discussed and consent obtained   Timeout:  patient name, date of birth, surgical site, and procedure verified Lesion destroyed using liquid nitrogen: Yes   Region frozen until ice ball extended beyond lesion: Yes   Lesion length (cm):  2.1 Lesion width (cm):  1.1 Margin per side (cm):  0.2 Final wound size (cm):  2.5 Outcome: patient tolerated procedure well with no complications   Post-procedure details: wound care instructions given    Lentigo Maligna type, bx proven 02/25/21  Discussed diagnosis in detail including significance of melanoma diagnosis which can be potentially lethal in more advanced cases.    Discussed that this particular melanoma in situ lentigo maligna type on the left forehead is biologically less aggressive than the more advanced melanomas and typically is sun exposure related.  Because of the superficial nature and less aggressive nature, there are other treatment options other than the traditional surgical excision options.  This  includes destructive and topical immunotherapy treatments.  The use of immunotherapy with imiquimod would likely consider be considered "off label".  He is advised that if this method does not work well and we see evidence of persistence or recurrence, we will have to revert to the surgical option.  We will likely do perilesional biopsies at the end of treatment to document clearing of the cancer.  Discussed treatment recommendations in detail advising that treatment recommendations are based on longitudinal studies and retrospective studies and are nationwide protocols.  Advised there is always potential for recurrence even after definitive treatment.  After definitive treatment, we recommend total-body skin exams every 3 months for a year; then every 4 months for a year; then every 6 months for 3 years.  At 5 years post treatment, if all looks good we would recommend at least yearly total-body skin exams for the rest of your life.  The patient was given time for questions and these were answered.  We recommend frequent self skin examinations; photoprotection with sunscreen, sun protective clothing, hats, sunglasses and sun avoidance.  If the patient notices any new or changing skin lesions the patient should return to the office immediately for evaluation.    Discussed txt options excising (+/- MOHs) vs Topical cr, Imiquimod, with LN2. Pt opts for LN2 followed by Immunotherapy (Imiquimod) treatment.  LN2 x 1 today, plan to start Imiquimod on f/u  Pt prefers LN2 and Imiquimod txt  Return in about 6 weeks (around 04/15/2021) for Melanoma IS f/u L forehead .  I, Donald [name], RMA, am acting as scribe for [name], MD .  Documentation: I have reviewed the above documentation for accuracy and completeness, and I agree with the above.  [name], MD

## 2021-03-10 ENCOUNTER — Encounter: Payer: Self-pay | Admitting: Dermatology

## 2021-03-13 ENCOUNTER — Other Ambulatory Visit: Payer: Self-pay | Admitting: Pulmonary Disease

## 2021-03-13 ENCOUNTER — Other Ambulatory Visit (HOSPITAL_COMMUNITY): Payer: Self-pay | Admitting: Pulmonary Disease

## 2021-03-13 DIAGNOSIS — R918 Other nonspecific abnormal finding of lung field: Secondary | ICD-10-CM

## 2021-04-15 ENCOUNTER — Ambulatory Visit: Payer: Medicare Other | Admitting: Dermatology

## 2021-04-15 ENCOUNTER — Other Ambulatory Visit: Payer: Self-pay

## 2021-04-15 DIAGNOSIS — D0339 Melanoma in situ of other parts of face: Secondary | ICD-10-CM | POA: Diagnosis not present

## 2021-04-15 DIAGNOSIS — L570 Actinic keratosis: Secondary | ICD-10-CM | POA: Diagnosis not present

## 2021-04-15 DIAGNOSIS — D0330 Melanoma in situ of unspecified part of face: Secondary | ICD-10-CM

## 2021-04-15 DIAGNOSIS — L820 Inflamed seborrheic keratosis: Secondary | ICD-10-CM | POA: Diagnosis not present

## 2021-04-15 DIAGNOSIS — L578 Other skin changes due to chronic exposure to nonionizing radiation: Secondary | ICD-10-CM | POA: Diagnosis not present

## 2021-04-15 MED ORDER — IMIQUIMOD 5 % EX CREA: TOPICAL_CREAM | Freq: Every day | CUTANEOUS | 2 refills | Status: DC

## 2021-04-15 NOTE — Progress Notes (Signed)
Follow-Up Visit   Subjective  Donald [name] is a 70 y.o. male who presents for the following: f/u 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 (L forehead, 6wk f/u LN2 at last visit).  The following portions of the chart were reviewed this encounter and updated as appropriate:   Tobacco  Allergies  Meds  Problems  Med Hx  Surg Hx  Fam Hx      Review of Systems:  No other skin or systemic complaints except as noted in HPI or Assessment and Plan.  Objective  Well appearing patient in no apparent distress; mood and affect are within normal limits.  A focused examination was performed including face. Relevant physical exam findings are noted in the Assessment and Plan.  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  Reviewed pros and cons of each.  Patient prefers to or pursue topical chemotherapy.  He is advised and understands that if topical chemotherapy is not successful, he will need to have surgery. We will plan some biopsies after the treatment is complete to see if there is any abnormal pathology remaining.  imiquimod (ALDARA) 5 % cream Apply topically at bedtime. Qhs aa left forehead for 4 weeks  Lentigo Maligna type Bx 02/25/21 S/p LN2 on 03/04/21  Start Imiquimod 5% cr qhs for 4 weeks  Discussed will treat with Imiquimod off and on and then will plan a few bx's to make sure clear  Reviewed expected reaction when using imiquimod cream, including irritation and mild inflammation and risk of erosions or more severe inflammation. Reviewed not to apply this in an area larger than 4 x 4 inches to avoid flu-like symptoms. Only a thin layer is required. Reviewed if too much irritation occurs, ensure application of  only a thin layer and decrease frequency slightly to achieve a tolerable level of inflammation.    Inflamed seborrheic keratosis R dorsum wrist x 1, Total = 1 With [facility] Discussed may need [place] steroid injections in future  Destruction of lesion - R dorsum wrist x 1, Total = 1  AK (actinic keratosis) (2) Left Forearm x 2  Destruction of lesion - Left Forearm x 2 Complexity: simple   Destruction method: cryotherapy   Informed consent: discussed and consent obtained   Timeout:  patient name, date of birth, surgical site, and procedure verified Lesion destroyed using liquid nitrogen: Yes   Region frozen until ice ball extended beyond lesion: Yes   Outcome: patient tolerated procedure well with no complications   Post-procedure details: wound care instructions given    Actinic Damage - chronic, secondary to cumulative UV radiation exposure/sun exposure over time - diffuse scaly erythematous macules with underlying dyspigmentation - Recommend daily broad spectrum sunscreen SPF 30+ to sun-exposed areas, reapply every 2 hours as needed.  - Recommend staying in the shade or wearing long sleeves, sun glasses (UVA+UVB protection) and wide brim hats (4-inch brim around the entire circumference of the hat). - Call for new or changing lesions.  Return in about 4 weeks (around 05/13/2021) for recheck Melanoma IS Lentigo maligna type L forhead.  I, [name], RMA, am acting as scribe for [name], MD . Documentation: I have reviewed the above documentation for accuracy and completeness, and I agree  with the above.  [name], MD

## 2021-04-15 NOTE — Patient Instructions (Addendum)
If you have any questions or concerns for your doctor, please call our main line at [phone] and press option 4 to reach your doctor's medical assistant. If no one answers, please leave a voicemail as directed and we will return your call as soon as possible. Messages left after 4 pm will be answered the following business day.   You may also send [place] a message via [facility]. We typically respond to MyChart messages within 1-2 business days.  For prescription refills, please ask your pharmacy to contact our office. Our fax number is [phone].  If you have an urgent issue when the clinic is closed that cannot wait until the next business day, you can page your doctor at the number below.    Please note that while we do our best to be available for urgent issues outside of office hours, we are not available 24/7.   If you have an urgent issue and are unable to reach [place], you may choose to seek medical care at your doctor's office, retail clinic, urgent care center, or emergency room.  If you have a medical emergency, please immediately call 911 or go to the emergency department.  Pager Numbers  - Dr. Nehemiah Massed: [phone]  - Dr. Laurence Ferrari: [phone]  - Dr. Nicole Kindred: [phone]  In the event of inclement weather, please call our main line at [phone] for an update on the status of any delays or closures.  Dermatology Medication Tips: Please keep the boxes that topical medications come in in order to help keep track of the instructions about where and how to use these. Pharmacies typically print the medication instructions only on the boxes and not directly on the medication tubes.   If your medication is too expensive, please contact our office at [phone] option 4 or send [place] a message through [facility].   We are unable to tell what your co-pay for medications will be in advance as this is different depending on your insurance coverage. However, we may be able to find a substitute  medication at lower cost or fill out paperwork to get insurance to cover a needed medication.   If a prior authorization is required to get your medication covered by your insurance company, please allow [place] 1-2 business days to complete this process.  Drug prices often vary depending on where the prescription is filled and some pharmacies may offer cheaper prices.  The website www.goodrx.com contains coupons for medications through different pharmacies. The prices here do not account for what the cost may be with help from insurance (it may be cheaper with your insurance), but the website can give you the price if you did not use any insurance.  - You can print the associated coupon and take it with your prescription to the pharmacy.  - You may also stop by our office during regular business hours and pick up a GoodRx coupon card.  - If you need your prescription sent electronically to a different pharmacy, notify our office through [facility] or by phone at [phone] option 4.   Start Imiquimod 5% cream nightly to affected area of Left forehead for 4 weeks

## 2021-04-21 ENCOUNTER — Encounter: Payer: Self-pay | Admitting: Dermatology

## 2021-05-14 ENCOUNTER — Ambulatory Visit: Payer: Medicare Other | Admitting: Dermatology

## 2021-05-14 ENCOUNTER — Other Ambulatory Visit: Payer: Self-pay

## 2021-05-14 ENCOUNTER — Encounter: Payer: Self-pay | Admitting: Dermatology

## 2021-05-14 DIAGNOSIS — D0339 Melanoma in situ of other parts of face: Secondary | ICD-10-CM

## 2021-05-14 DIAGNOSIS — D0330 Melanoma in situ of unspecified part of face: Secondary | ICD-10-CM

## 2021-05-14 NOTE — Patient Instructions (Signed)

## 2021-05-14 NOTE — Progress Notes (Signed)
   Follow-Up Visit   Subjective  Donald [name] is a 70 y.o. male who presents for the following: Melanoma IS f/u (L forehead, 4wk f/u, Imiquimod 5%  qhs x 4 wks, no flu like symptoms).  The following portions of the chart were reviewed this encounter and updated as appropriate:   Tobacco  Allergies  Meds  Problems  Med Hx  Surg Hx  Fam Hx     Review of Systems:  No other skin or systemic complaints except as noted in HPI or Assessment and Plan.  Objective  Well appearing patient in no apparent distress; mood and affect are within normal limits.  A focused examination was performed including face. Relevant physical exam findings are noted in the Assessment and Plan.  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  See photo. Cont Imiquimod 5% qhs aa L forehead Discussed if area gets real irritated for pt to take a photo, d/c Imiquimod for a week, then restart Imiquimod after a week.  Related Medications imiquimod (ALDARA) 5 % cream Apply topically at bedtime. Qhs aa left forehead for 4 weeks  Return in about 4 weeks (around 06/11/2021) for f/u 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.  I, [name], RMA, am acting as scribe for [name], MD . Documentation: I have reviewed the above documentation for accuracy and completeness, and I agree with the above.  [name], MD

## 2021-05-22 ENCOUNTER — Other Ambulatory Visit: Payer: Self-pay

## 2021-05-22 ENCOUNTER — Ambulatory Visit
Admission: RE | Admit: 2021-05-22 | Discharge: 2021-05-22 | Disposition: A | Discharge status: Home / Self Care | Payer: Medicare Other | Source: Ambulatory Visit | Attending: Pulmonary Disease | Admitting: Pulmonary Disease

## 2021-05-22 DIAGNOSIS — R918 Other nonspecific abnormal finding of lung field: Secondary | ICD-10-CM | POA: Diagnosis present

## 2021-05-22 IMAGING — CT CT CHEST W/O CM
2 of 4 series · 15 of 36 positions shown, 18 images · non-contrast
Comparison: Coronary calcium score report from [facility] dated
[DATE]. Report is available but images are not available.

CT abdomen [DATE]

CLINICAL DATA: Follow-up pulmonary nodule seen on coronary calcium
score exam. History of 9 mm subpleural nodule in the left lower
lobe.

EXAM:
CT CHEST WITHOUT CONTRAST
TECHNIQUE: Multidetector CT imaging of the chest was performed following the
standard protocol without IV contrast.

[Series 2: chest 2.00 · axial · 0.75mm/px · z∈[-1309,-985]mm · 12 of 192 slices shown, 15 images]
[im 15/192  mediastinal]
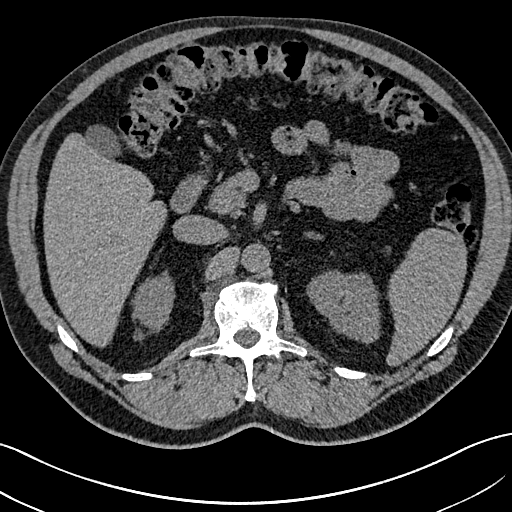
[im 15/192  lung]
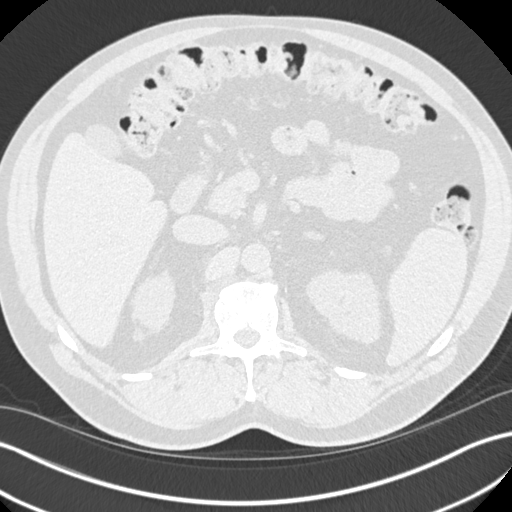
[im 30/192  lung]
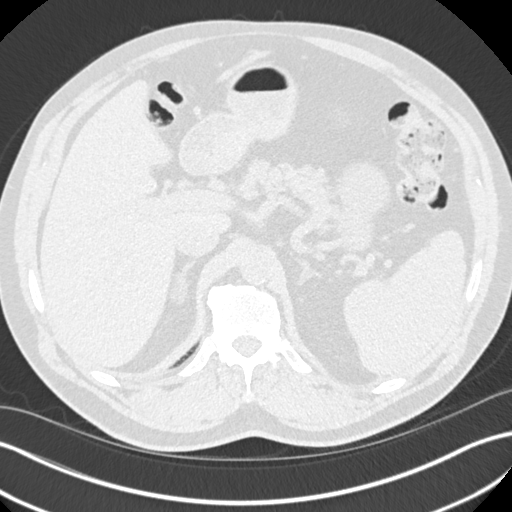
[im 45/192  lung]
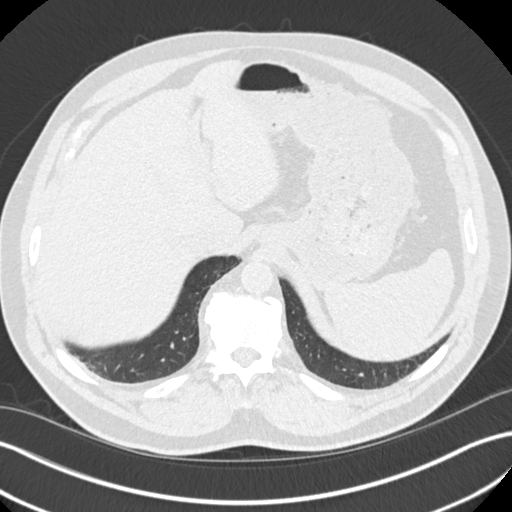
[im 59/192  lung]
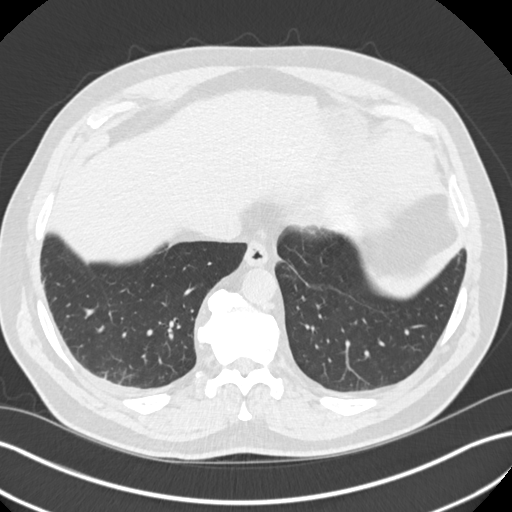
[im 74/192  mediastinal]
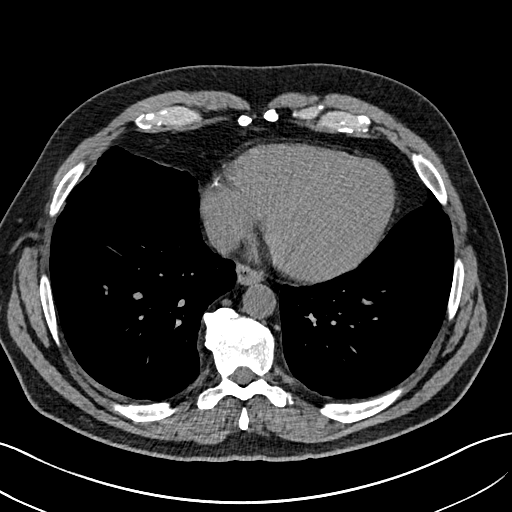
[im 74/192  lung]
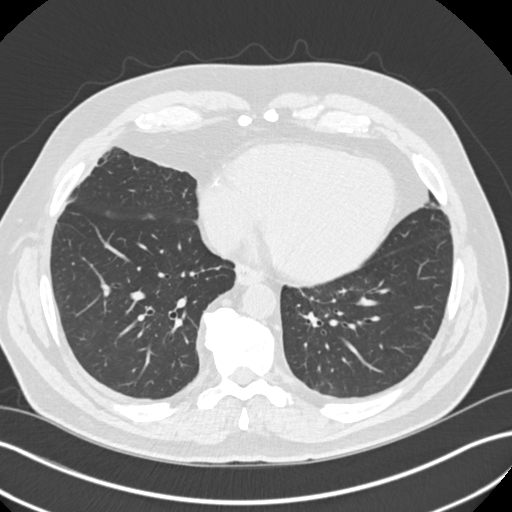
[im 89/192  lung]
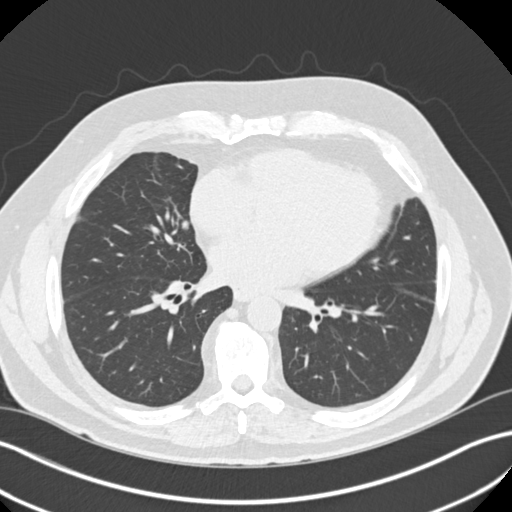
[im 103/192  lung]
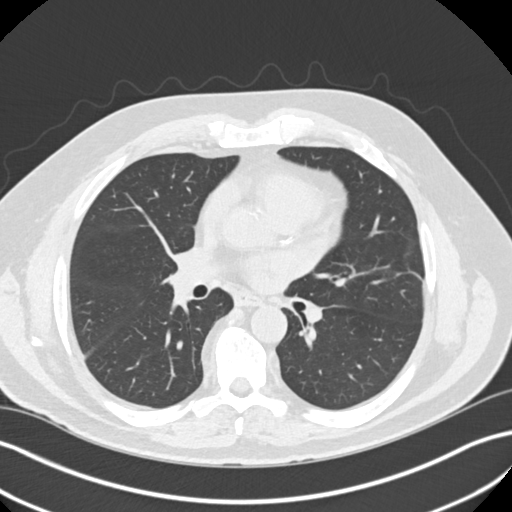
[im 118/192  lung]
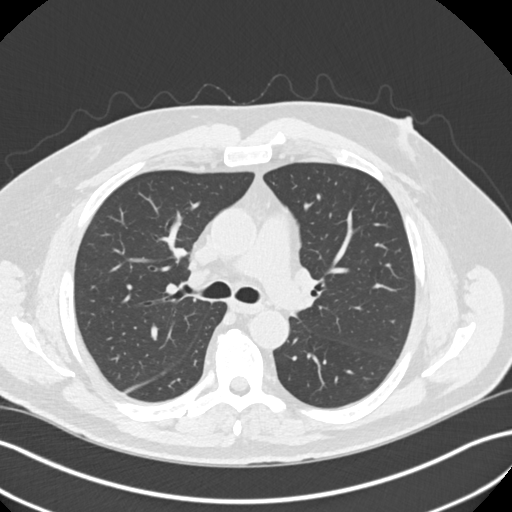
[im 133/192  mediastinal]
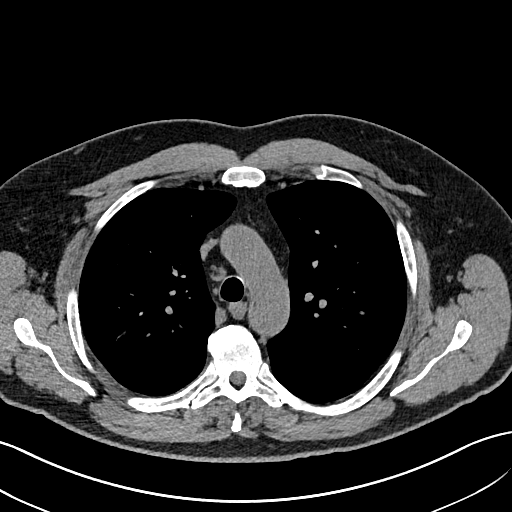
[im 133/192  lung]
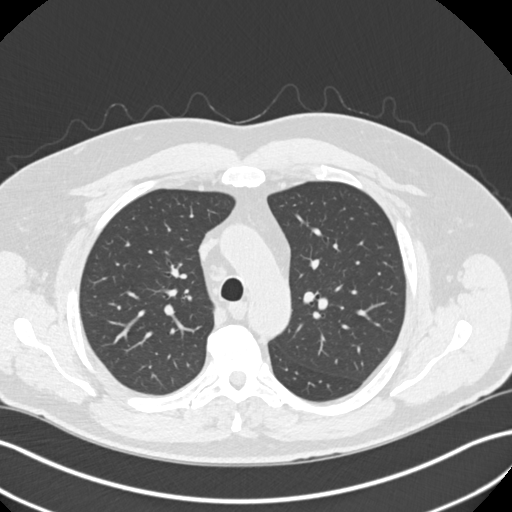
[im 147/192  lung]
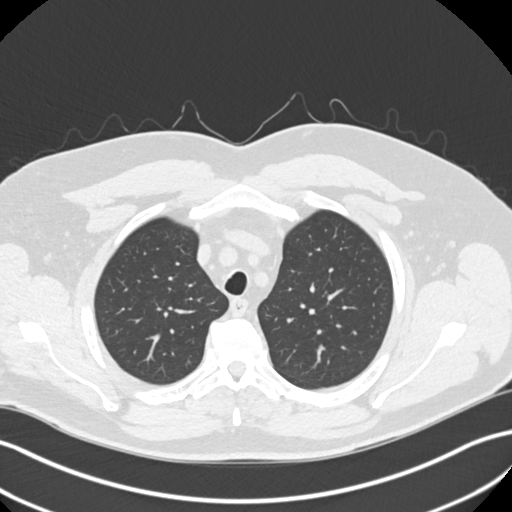
[im 162/192  lung]
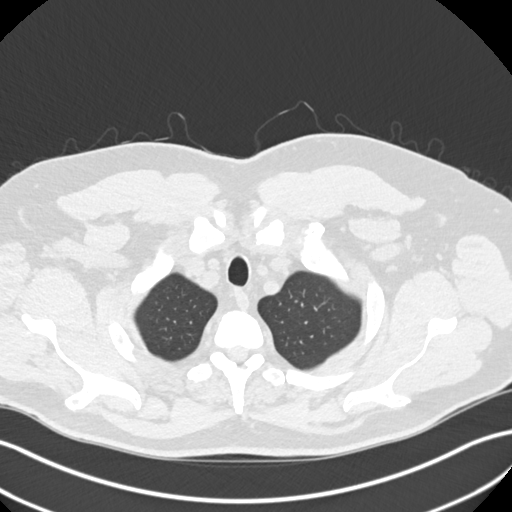
[im 177/192  lung]
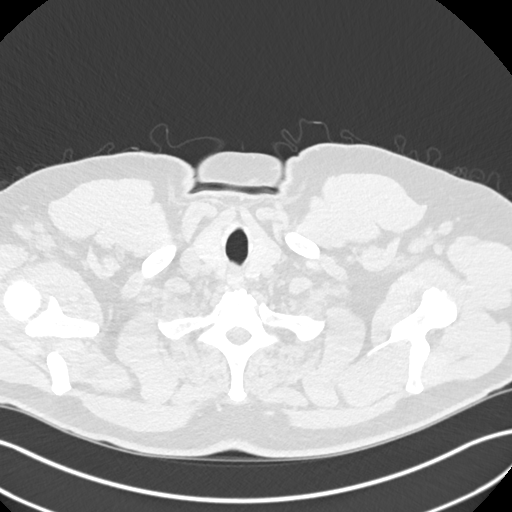

[Series 5: coronals chest 2.00 cor · coronal · 0.75mm/px · 3 of 169 slices shown]
[im 34/169  lung]
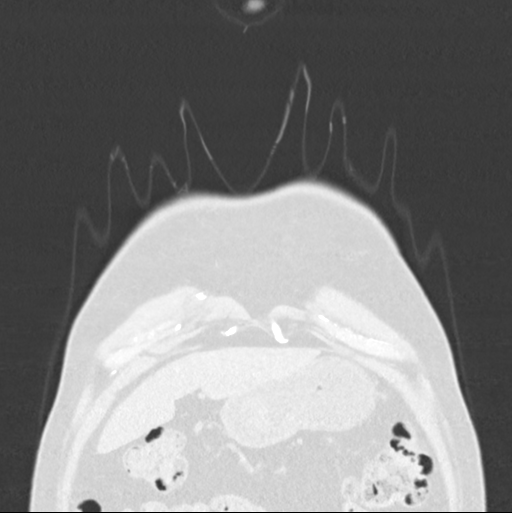
[im 68/169  lung]
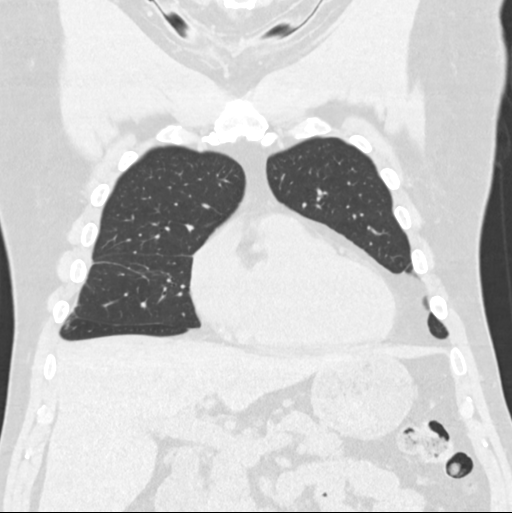
[im 101/169  lung]
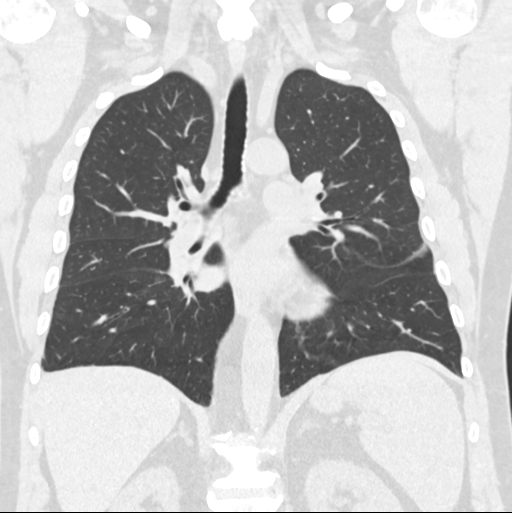

[15 of 36 positions shown; findings below may reference images not displayed]

FINDINGS: Cardiovascular: Small amount of calcium associated with the thoracic
aorta. Normal caliber of the thoracic aorta. Heart size is normal
without significant pericardial fluid.

Mediastinum/Nodes: Multiple small mediastinal lymph nodes but no
significant lymph node enlargement in the mediastinum, hilar regions
or axillary regions.

Lungs/Pleura: Trachea and mainstem bronchi are patent. No pleural
effusions. Tiny nodule in the posterior right lower lobe is likely
calcified on sequence 3, image 151 and stable since [KB]. Few tiny
nodular densities along the right middle lobe have the configuration
of fissural lymph nodes. Minimal pleural thickening with a
calcification in the posterior right lower lobe. Pleural-based
nodular structure at the left lung base on sequence 3, image 129
measures up to 1.1 cm and measured 0.9 cm in [KB]. This area has
minimally changed and suspect this represents focal scarring.
Pleural-based nodule along the medial aspect of the left upper lobe
on sequence 3, image 63 measures up to 3 mm. No airspace disease or
consolidation in the lungs.

Upper Abdomen: Normal appearance of the upper abdominal structures.

Musculoskeletal: Multilevel degenerative changes in thoracic spine.
No acute bone abnormality.
IMPRESSION: 1. No acute chest abnormality.
2. Pleural-based nodular density in the lateral left lower lobe has
minimally changed since [KB]. This finding is suggestive for post
inflammatory changes or scarring. Coronary calcium score exam from
[REDACTED] in [KB] is not available. Can make an addendum if
[REDACTED] images are made available for comparison.
3. Additional small nodular densities in the lungs including a 3 mm
nodule along the medial left upper lobe. No follow-up needed if
patient is low-risk (and has no known or suspected primary
neoplasm). Non-contrast chest CT can be considered in 12 months if
patient is high-risk. This recommendation follows the consensus
statement: Guidelines for Management of Incidental Pulmonary Nodules
Detected on CT Images: From the [HOSPITAL] [KB]; Radiology

## 2021-06-08 ENCOUNTER — Other Ambulatory Visit: Payer: Self-pay | Admitting: Student

## 2021-06-08 DIAGNOSIS — M75101 Unspecified rotator cuff tear or rupture of right shoulder, not specified as traumatic: Secondary | ICD-10-CM

## 2021-06-08 DIAGNOSIS — M7581 Other shoulder lesions, right shoulder: Secondary | ICD-10-CM

## 2021-06-18 ENCOUNTER — Other Ambulatory Visit: Payer: Self-pay

## 2021-06-18 ENCOUNTER — Ambulatory Visit: Payer: Medicare Other | Admitting: Dermatology

## 2021-06-18 ENCOUNTER — Encounter: Payer: Self-pay | Admitting: Dermatology

## 2021-06-18 DIAGNOSIS — L820 Inflamed seborrheic keratosis: Secondary | ICD-10-CM | POA: Diagnosis not present

## 2021-06-18 DIAGNOSIS — D0339 Melanoma in situ of other parts of face: Secondary | ICD-10-CM

## 2021-06-18 DIAGNOSIS — L578 Other skin changes due to chronic exposure to nonionizing radiation: Secondary | ICD-10-CM | POA: Diagnosis not present

## 2021-06-18 DIAGNOSIS — D0330 Melanoma in situ of unspecified part of face: Secondary | ICD-10-CM

## 2021-06-18 MED ORDER — IMIQUIMOD 5 % EX CREA
TOPICAL_CREAM | Freq: Every day | CUTANEOUS | 2 refills | Status: DC
Start: 2021-06-18 — End: 2022-08-19

## 2021-06-18 NOTE — Patient Instructions (Signed)

## 2021-06-18 NOTE — Progress Notes (Signed)
   Follow-Up Visit   Subjective  Donald [name] is a 70 y.o. male who presents for the following: Melanoma IS f/u (L forehead, Imiquimod 5% cr qhs, total of 8 wks of treatment, pt didn't notice any more flare up in past 4 weeks of treatment) and check spot (L dorsum hand, 3wks, no symptoms).  The following portions of the chart were reviewed this encounter and updated as appropriate:   Tobacco  Allergies  Meds  Problems  Med Hx  Surg Hx  Fam Hx     Review of Systems:  No other skin or systemic complaints except as noted in HPI or Assessment and Plan.  Objective  Well appearing patient in no apparent distress; mood and affect are within normal limits.  A focused examination was performed including face. Relevant physical exam findings are noted in the Assessment and Plan.  L forehead Texture changes in skin, looks and feels mild roughness  L dorsum hand x 1, Total = 1 Erythematous keratotic or waxy stuck-on papule or plaque.    Assessment & Plan  Melanoma in situ of face L forehead Lentigo maligna type Bx 02/25/21 S/p LN2 on 03/04/21 and Imiquimod 5% qhs x 8wks total Cont Imiquimod 5% cr qhs for 4 more weeks Plan biopsies on f/u  Related Medications imiquimod (ALDARA) 5 % cream Apply topically at bedtime. Qhs aa left forehead for 4 weeks  Inflamed seborrheic keratosis L dorsum hand x 1, Total = 1  Destruction of lesion - L dorsum hand x 1, Total = 1 Complexity: simple   Destruction method: cryotherapy   Informed consent: discussed and consent obtained   Timeout:  patient name, date of birth, surgical site, and procedure verified Lesion destroyed using liquid nitrogen: Yes   Region frozen until ice ball extended beyond lesion: Yes   Outcome: patient tolerated procedure well with no complications   Post-procedure details: wound care instructions given    Actinic Damage - chronic, secondary to cumulative UV radiation exposure/sun exposure over time - diffuse scaly  erythematous macules with underlying dyspigmentation - Recommend daily broad spectrum sunscreen SPF 30+ to sun-exposed areas, reapply every 2 hours as needed.  - Recommend staying in the shade or wearing long sleeves, sun glasses (UVA+UVB protection) and wide brim hats (4-inch brim around the entire circumference of the hat). - Call for new or changing lesions.  Return in about 8 weeks (around 08/13/2021) for Melanoma IS f/u.  I, [name], RMA, am acting as scribe for [name], MD . Documentation: I have reviewed the above documentation for accuracy and completeness, and I agree with the above.  [name], MD

## 2021-06-19 ENCOUNTER — Ambulatory Visit
Admission: RE | Admit: 2021-06-19 | Discharge: 2021-06-19 | Disposition: A | Discharge status: Home / Self Care | Payer: Medicare Other | Source: Ambulatory Visit | Attending: Student | Admitting: Student

## 2021-06-19 DIAGNOSIS — M7581 Other shoulder lesions, right shoulder: Secondary | ICD-10-CM | POA: Insufficient documentation

## 2021-06-19 DIAGNOSIS — M75101 Unspecified rotator cuff tear or rupture of right shoulder, not specified as traumatic: Secondary | ICD-10-CM | POA: Insufficient documentation

## 2021-06-19 IMAGING — MR MR SHOULDER*R* W/O CM
4 of 5 series · 19 of 40 positions shown · non-contrast
Comparison: None.

CLINICAL DATA: Injured shoulder 8-9 weeks ago.  Anterior pain.

EXAM:
MRI OF THE RIGHT SHOULDER WITHOUT CONTRAST
TECHNIQUE: Multiplanar, multisequence MR imaging of the shoulder was performed.
No intravenous contrast was administered.

[Series 5: T2 fat-sat · axial · right · 4.0mm · 0.44mm/px · z∈[+11,+126]mm · 5 of 26 slices shown (1 of 2)]
[im 1/26]
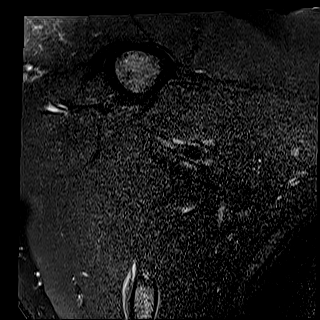
[im 7/26]
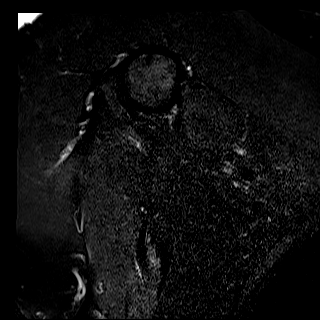
[im 13/26]
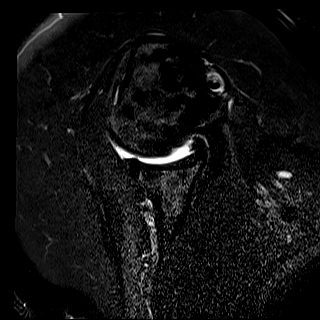
[im 19/26]
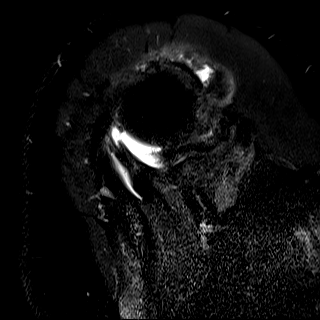
[im 26/26]
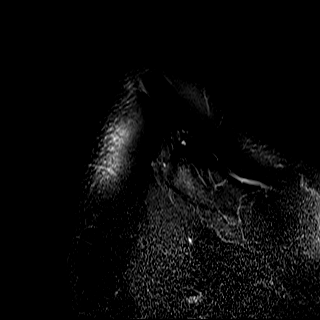

[Series 6: PD · sagittal · right · 4.0mm · 0.44mm/px · 5 of 26 slices shown]
[im 1/26]
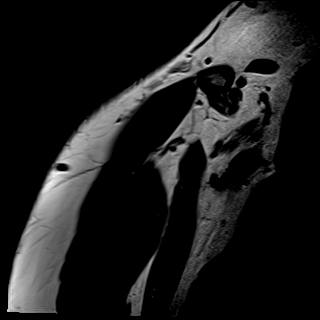
[im 7/26]
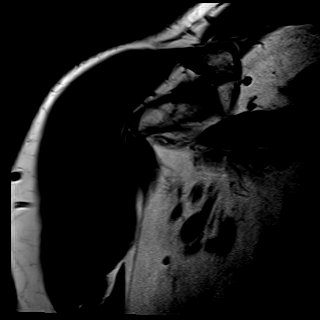
[im 13/26]
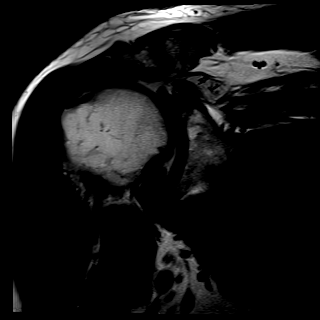
[im 19/26]
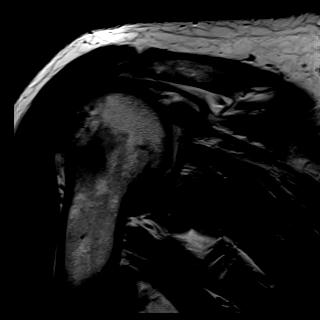
[im 26/26]
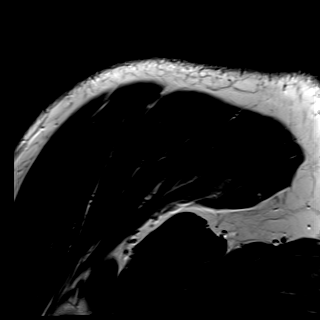

[Series 7: T2 fat-sat · sagittal · right · 4.0mm · 0.44mm/px · 5 of 26 slices shown (2 of 2)]
[im 1/26]
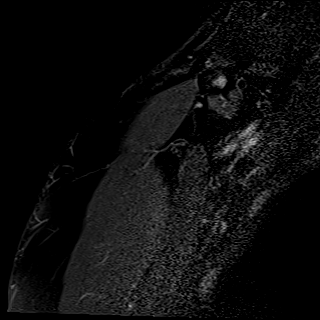
[im 7/26]
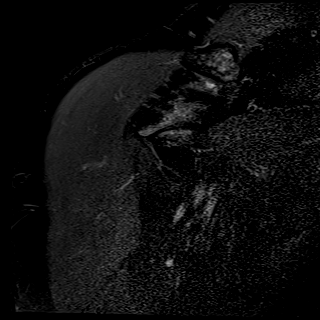
[im 13/26]
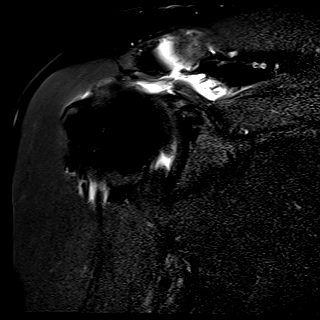
[im 19/26]
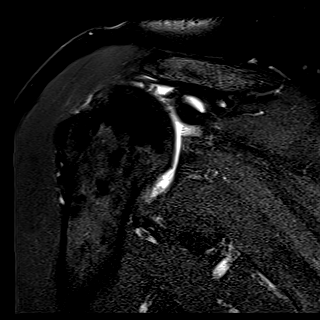
[im 26/26]
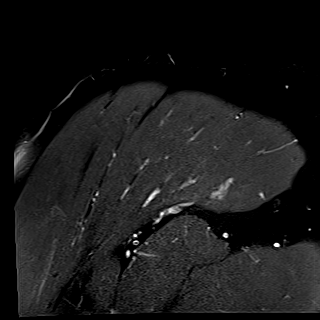

[Series 9: T1 · coronal · right · 4.0mm · 0.44mm/px · 4 of 22 slices shown]
[im 1/22]
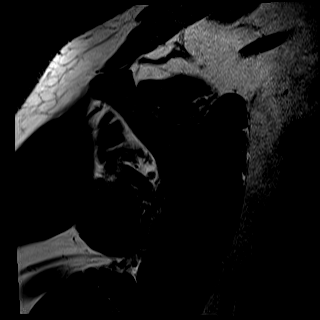
[im 8/22]
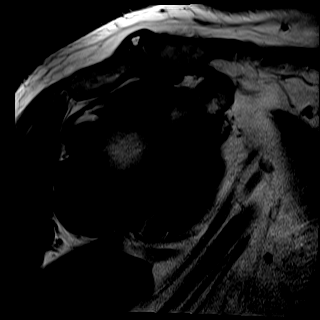
[im 15/22]
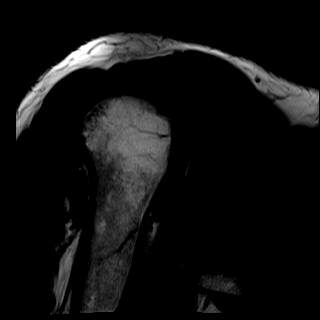
[im 22/22]
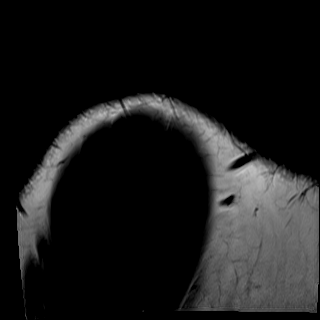

[19 of 40 positions shown; findings below may reference images not displayed]

FINDINGS: Rotator cuff: Complete tear of the supraspinatus tendon and
infraspinatus tendon with 5.3 cm of retraction. Teres minor tendon
is intact. Subscapularis tendon is intact.

Muscles: No muscle atrophy or edema. No intramuscular fluid
collection or hematoma.

Biceps Long Head: Severe tendinosis of the intra-articular portion
of the long head of the biceps tendon.

Acromioclavicular Joint: Moderate arthropathy of the
acromioclavicular joint. No subacromial/subdeltoid bursal fluid.

Glenohumeral Joint: Small joint effusion.  No chondral defect.

Labrum: Grossly intact, but evaluation is limited by lack of
intraarticular fluid/contrast.

Bones: No fracture or dislocation. No aggressive osseous lesion.

Other: No fluid collection or hematoma.
IMPRESSION: 1. Complete tear of the supraspinatus tendon and infraspinatus
tendon with 5.3 cm of retraction.
2. Severe tendinosis of the intra-articular portion of the long head
of the biceps tendon.

## 2021-08-13 ENCOUNTER — Encounter: Payer: Self-pay | Admitting: Dermatology

## 2021-08-13 ENCOUNTER — Other Ambulatory Visit: Payer: Self-pay

## 2021-08-13 ENCOUNTER — Ambulatory Visit: Payer: Medicare Other | Admitting: Dermatology

## 2021-08-13 DIAGNOSIS — D0339 Melanoma in situ of other parts of face: Secondary | ICD-10-CM

## 2021-08-13 DIAGNOSIS — L578 Other skin changes due to chronic exposure to nonionizing radiation: Secondary | ICD-10-CM | POA: Diagnosis not present

## 2021-08-13 DIAGNOSIS — L738 Other specified follicular disorders: Secondary | ICD-10-CM

## 2021-08-13 DIAGNOSIS — L739 Follicular disorder, unspecified: Secondary | ICD-10-CM

## 2021-08-13 DIAGNOSIS — D0330 Melanoma in situ of unspecified part of face: Secondary | ICD-10-CM

## 2021-08-13 DIAGNOSIS — D492 Neoplasm of unspecified behavior of bone, soft tissue, and skin: Secondary | ICD-10-CM

## 2021-08-13 NOTE — Patient Instructions (Addendum)
If you have any questions or concerns for your doctor, please call our main line at [phone] and press option 4 to reach your doctor's medical assistant. If no one answers, please leave a voicemail as directed and we will return your call as soon as possible. Messages left after 4 pm will be answered the following business day.   You may also send us a message via MyChart. We typically respond to MyChart messages within 1-2 business days.  For prescription refills, please ask your pharmacy to contact our office. Our fax number is [phone].  If you have an urgent issue when the clinic is closed that cannot wait until the next business day, you can page your doctor at the number below.    Please note that while we do our best to be available for urgent issues outside of office hours, we are not available 24/7.   If you have an urgent issue and are unable to reach us, you may choose to seek medical care at your doctor's office, retail clinic, urgent care center, or emergency room.  If you have a medical emergency, please immediately call 911 or go to the emergency department.  Pager Numbers  - Dr. Kowalski: [phone]  - Dr. Moye: [phone]  - Dr. Stewart: [phone]  In the event of inclement weather, please call our main line at [phone] for an update on the status of any delays or closures.  Dermatology Medication Tips: Please keep the boxes that topical medications come in in order to help keep track of the instructions about where and how to use these. Pharmacies typically print the medication instructions only on the boxes and not directly on the medication tubes.   If your medication is too expensive, please contact our office at [phone] option 4 or send us a message through MyChart.   We are unable to tell what your co-pay for medications will be in advance as this is different depending on your insurance coverage. However, we may be able to find a substitute  medication at lower cost or fill out paperwork to get insurance to cover a needed medication.   If a prior authorization is required to get your medication covered by your insurance company, please allow us 1-2 business days to complete this process.  Drug prices often vary depending on where the prescription is filled and some pharmacies may offer cheaper prices.  The website www.goodrx.com contains coupons for medications through different pharmacies. The prices here do not account for what the cost may be with help from insurance (it may be cheaper with your insurance), but the website can give you the price if you did not use any insurance.  - You can print the associated coupon and take it with your prescription to the pharmacy.  - You may also stop by our office during regular business hours and pick up a GoodRx coupon card.  - If you need your prescription sent electronically to a different pharmacy, notify our office through [facility] MyChart or by phone at [phone] option 4.   Wound Care Instructions  Cleanse wound gently with soap and water once a day then pat dry with clean gauze. Apply a thing coat of Petrolatum (petroleum jelly, "Vaseline") over the wound (unless you have an allergy to this). We recommend that you use a new, sterile tube of Vaseline. Do not pick or remove scabs. Do not remove the yellow or white "healing tissue" from the base of the wound.  Cover the   wound with fresh, clean, nonstick gauze and secure with paper tape. You may use Band-Aids in place of gauze and tape if the would is small enough, but would recommend trimming much of the tape off as there is often too much. Sometimes Band-Aids can irritate the skin.  You should call the office for your biopsy report after 1 week if you have not already been contacted.  If you experience any problems, such as abnormal amounts of bleeding, swelling, significant bruising, significant pain, or evidence of infection,  please call the office immediately.  FOR ADULT SURGERY PATIENTS: If you need something for pain relief you may take 1 extra strength Tylenol (acetaminophen) AND 2 Ibuprofen (200mg each) together every 4 hours as needed for pain. (do not take these if you are allergic to them or if you have a reason you should not take them.) Typically, you may only need pain medication for 1 to 3 days.    

## 2021-08-13 NOTE — Progress Notes (Signed)
Follow-Up Visit   Subjective  Donald [name] is a 70 y.o. male who presents for the following: Melanoma IS f/u (L forehead, Imiquimod 5%cr  x 4 more weeks for a total of 12 wks, pt has been off for 4 weeks).  The following portions of the chart were reviewed this encounter and updated as appropriate:   Tobacco  Allergies  Meds  Problems  Med Hx  Surg Hx  Fam Hx     Review of Systems:  No other skin or systemic complaints except as noted in HPI or Assessment and Plan.  Objective  Well appearing patient in no apparent distress; mood and affect are within normal limits.  A focused examination was performed including face. Relevant physical exam findings are noted in the Assessment and Plan.  L forehead Normal appearing skin within the previous Melanoma IS lentigo maligna site  Mid Forehead inferior Normal appearing skin within the previous Melanoma IS lentigo maligna site     Left Forehead medial Normal appearing skin within the previous Melanoma IS lentigo maligna site  Left Forehead lateral Normal appearing skin within the previous Melanoma IS lentigo maligna site   Assessment & Plan  Melanoma in situ of face excluding eyelid, nose, lip, and ear (HCC) L forehead  L forehead Lentigo maligna type Bx 02/25/21 S/p LN2 on 03/04/21 and Imiquimod 5% qhs x 12wks total  3 biopies today to check if any persistent Melanoma IS / Lentigo Maligna type after LN2 + Imiquimod treatment for 12 weeks.  Related Medications imiquimod (ALDARA) 5 % cream Apply topically at bedtime. Qhs aa left forehead for 4 weeks  Neoplasm of skin (3) Mid Forehead inferior  Skin / nail biopsy Type of biopsy: tangential   Informed consent: discussed and consent obtained   Timeout: patient name, date of birth, surgical site, and procedure verified   Procedure prep:  Patient was prepped and draped in usual sterile fashion Prep type:  Isopropyl alcohol Anesthesia: the lesion was anesthetized in a  standard fashion   Anesthetic:  1% lidocaine w/ epinephrine 1-100,000 buffered w/ 8.4% NaHCO3 Instrument used: flexible razor blade   Outcome: patient tolerated procedure well   Post-procedure details: sterile dressing applied and wound care instructions given   Dressing type: bandage and bacitracin    Specimen 3 - Surgical pathology Differential Diagnosis: R/O persistent Melanoma IS  Check Margins: No Normal appearing skin within the previous Melanoma IS lentigo maligna site  Left Forehead medial  Skin / nail biopsy Type of biopsy: tangential   Informed consent: discussed and consent obtained   Timeout: patient name, date of birth, surgical site, and procedure verified   Procedure prep:  Patient was prepped and draped in usual sterile fashion Prep type:  Isopropyl alcohol Anesthesia: the lesion was anesthetized in a standard fashion   Anesthetic:  1% lidocaine w/ epinephrine 1-100,000 buffered w/ 8.4% NaHCO3 Instrument used: flexible razor blade   Outcome: patient tolerated procedure well   Post-procedure details: sterile dressing applied and wound care instructions given   Dressing type: bandage and bacitracin    Specimen 1 - Surgical pathology Differential Diagnosis: R/O persistent Melanoma IS  Check Margins: No Normal appearing skin within the previous Melanoma IS lentigo maligna site  Left Forehead lateral  Skin / nail biopsy Type of biopsy: tangential   Informed consent: discussed and consent obtained   Timeout: patient name, date of birth, surgical site, and procedure verified   Procedure prep:  Patient was prepped and draped in  usual sterile fashion Prep type:  Isopropyl alcohol Anesthesia: the lesion was anesthetized in a standard fashion   Anesthetic:  1% lidocaine w/ epinephrine 1-100,000 buffered w/ 8.4% NaHCO3 Instrument used: flexible razor blade   Outcome: patient tolerated procedure well   Post-procedure details: sterile dressing applied and wound care  instructions given   Dressing type: bandage and bacitracin    Specimen 2 - Surgical pathology Differential Diagnosis:  R/O persistent Melanoma IS  Check Margins: No Normal appearing skin within the previous Melanoma IS lentigo maligna site  Actinic Damage - chronic, secondary to cumulative UV radiation exposure/sun exposure over time - diffuse scaly erythematous macules with underlying dyspigmentation - Recommend daily broad spectrum sunscreen SPF 30+ to sun-exposed areas, reapply every 2 hours as needed.  - Recommend staying in the shade or wearing long sleeves, sun glasses (UVA+UVB protection) and wide brim hats (4-inch brim around the entire circumference of the hat). - Call for new or changing lesions.  Return in about 4 months (around 12/14/2021) for Recheck Melanoma IS Lentigo Maligna L forehead, TBSE.  I, Donald [name], RMA, am acting as scribe for [name], MD . Documentation: I have reviewed the above documentation for accuracy and completeness, and I agree with the above.  [name], MD

## 2021-08-17 ENCOUNTER — Other Ambulatory Visit (HOSPITAL_COMMUNITY): Payer: Self-pay | Admitting: Pulmonary Disease

## 2021-08-17 DIAGNOSIS — R030 Elevated blood-pressure reading, without diagnosis of hypertension: Secondary | ICD-10-CM

## 2021-08-18 ENCOUNTER — Telehealth: Payer: Self-pay

## 2021-08-18 NOTE — Telephone Encounter (Signed)
-----   Message from Ralene Bathe, MD sent at 08/17/2021  5:46 PM EDT ----- Diagnosis 1. Skin , left forehead medial PERIFOLLICULAR GRANULOMATOUS INFLAMMATION, SEE DESCRIPTION 2. Skin , left forehead lateral CHRONIC FOLLICULITIS 3. Skin , mid forehead inferior MELANOMA IN SITU, LENTIGO MALIGNA TYPE, PERIPHERAL MARGIN INVOLVED  1&2 - Benign inflammation of follicles - No evidence of cancer  3- residual Melanoma in situ - Lentigo Maligna type (mid forehead inferior specimen only) Schedule pt appt in next 2 weeks to discuss treatment options; mark and take photos again.  [MOHS (vs PDT?)]

## 2021-08-18 NOTE — Telephone Encounter (Signed)
Left message on voicemail to return my call.  

## 2021-08-19 ENCOUNTER — Telehealth: Payer: Self-pay

## 2021-08-19 NOTE — Telephone Encounter (Signed)
Spoke with pt and advised him of results. He had no concerns. Scheduled appt to discuss treatment options.

## 2021-08-19 NOTE — Telephone Encounter (Signed)
-----   Message from Ralene Bathe, MD sent at 08/17/2021  5:46 PM EDT ----- Diagnosis 1. Skin , left forehead medial PERIFOLLICULAR GRANULOMATOUS INFLAMMATION, SEE DESCRIPTION 2. Skin , left forehead lateral CHRONIC FOLLICULITIS 3. Skin , mid forehead inferior MELANOMA IN SITU, LENTIGO MALIGNA TYPE, PERIPHERAL MARGIN INVOLVED  1&2 - Benign inflammation of follicles - No evidence of cancer  3- residual Melanoma in situ - Lentigo Maligna type (mid forehead inferior specimen only) Schedule pt appt in next 2 weeks to discuss treatment options; mark and take photos again.  [MOHS (vs PDT?)]

## 2021-08-31 ENCOUNTER — Encounter: Payer: Self-pay | Admitting: Dermatology

## 2021-08-31 ENCOUNTER — Other Ambulatory Visit: Payer: Self-pay

## 2021-08-31 ENCOUNTER — Ambulatory Visit: Payer: Medicare Other | Admitting: Dermatology

## 2021-08-31 DIAGNOSIS — C439 Malignant melanoma of skin, unspecified: Secondary | ICD-10-CM

## 2021-08-31 DIAGNOSIS — C4339 Malignant melanoma of other parts of face: Secondary | ICD-10-CM | POA: Diagnosis not present

## 2021-08-31 NOTE — Progress Notes (Signed)
   Follow-Up Visit   Subjective  Donald [name] is a 70 y.o. male who presents for the following: Follow-up (Patient here today for follow up on biopsy proven melanoma in situ at left mid forehead. Patient here is to discuss treatment options. ).  The following portions of the chart were reviewed this encounter and updated as appropriate:  Tobacco  Allergies  Meds  Problems  Med Hx  Surg Hx  Fam Hx     Review of Systems: No other skin or systemic complaints except as noted in HPI or Assessment and Plan.  Objective  Well appearing patient in no apparent distress; mood and affect are within normal limits.  A focused examination was performed including forehead. Relevant physical exam findings are noted in the Assessment and Plan.  left mid forehead inferior Healing biopsy          Assessment & Plan  Malignant melanoma of skin ([place]) left mid forehead inferior Biopsy proven melanoma left mid forehead inferior   MELANOMA IN SITU, LENTIGO MALIGNA TYPE, PERIPHERAL MARGIN INVOLVED S/P Prolonged treatment with topical Imiquimod with clinical clearing. 3 biopsies from treated area show 2 negative biopsies and one (#3 inferior forehead) + for persistent Melanoma in situ - Lentigo Maligna type. Recommend MOHS surgery. Will refer. Informational Mohs pamphlet given. See Photo with marking at + biopsy site.  Return for keep follow up as scheduled . IRuthell Rummage, CMA, am acting as scribe for [name], MD. Documentation: I have reviewed the above documentation for accuracy and completeness, and I agree with the above.  [name], MD

## 2021-08-31 NOTE — Patient Instructions (Signed)

## 2021-09-01 NOTE — Addendum Note (Signed)
Addended by: Ruthell Rummage A on: 09/01/2021 08:09 AM   Modules accepted: Orders

## 2021-09-18 ENCOUNTER — Telehealth (HOSPITAL_COMMUNITY): Payer: Self-pay | Admitting: Emergency Medicine

## 2021-09-18 DIAGNOSIS — R030 Elevated blood-pressure reading, without diagnosis of hypertension: Secondary | ICD-10-CM

## 2021-09-18 MED ORDER — METOPROLOL TARTRATE 100 MG PO TABS: 100.0000 mg | ORAL_TABLET | Freq: Once | ORAL | 0 refills | Status: DC

## 2021-09-18 NOTE — Telephone Encounter (Signed)
Attempted to call patient regarding upcoming cardiac CT appointment. Left message on voicemail with name and callback number Marchia Bond RN Navigator [facility] [phone] Office [phone] Cell  100mg  metoprolol tart sent to pharm on file for HR control Mychart message sent

## 2021-09-21 ENCOUNTER — Other Ambulatory Visit: Payer: Self-pay

## 2021-09-21 ENCOUNTER — Ambulatory Visit
Admission: RE | Admit: 2021-09-21 | Discharge: 2021-09-21 | Disposition: A | Discharge status: Home / Self Care | Payer: Medicare Other | Source: Ambulatory Visit | Attending: Pulmonary Disease | Admitting: Pulmonary Disease

## 2021-09-21 DIAGNOSIS — R030 Elevated blood-pressure reading, without diagnosis of hypertension: Secondary | ICD-10-CM | POA: Insufficient documentation

## 2021-09-21 DIAGNOSIS — I2510 Atherosclerotic heart disease of native coronary artery without angina pectoris: Secondary | ICD-10-CM | POA: Insufficient documentation

## 2021-09-21 DIAGNOSIS — I700 Atherosclerosis of aorta: Secondary | ICD-10-CM | POA: Diagnosis not present

## 2021-09-21 IMAGING — CT CT HEART MORP W/ CTA COR W/ SCORE W/ CA W/CM &/OR W/O CM
1 of 13 series · 4 of 20 positions shown, 5 images · non-contrast
Comparison: CT of the chest from [DATE].

Addendum:
CLINICAL DATA: Reactive airway disease, pulmonary nodule, abnormal
calcium scan

EXAM:
Cardiac/Coronary  CTA
TECHNIQUE: The patient was scanned on a Siemens Somatom go.Top scanner.

[Series 26: multiphase % cta coronary 0.60 · axial · 0.40mm/px · z∈[-1143,-1065]mm · 4 of 3888 slices shown, 5 images]
[im 778/3888  vessel]
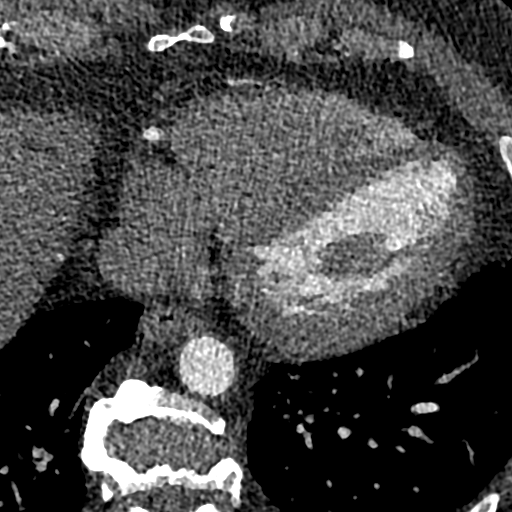
[im 778/3888  lung]
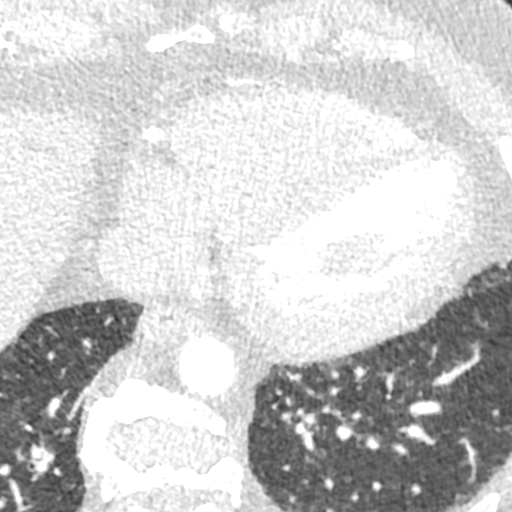
[im 1555/3888  vessel]
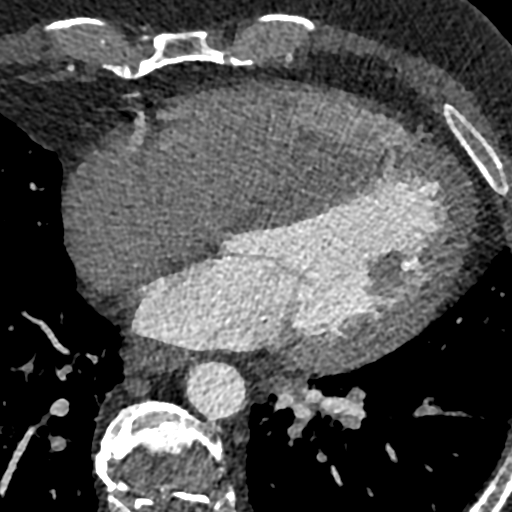
[im 2333/3888  vessel]
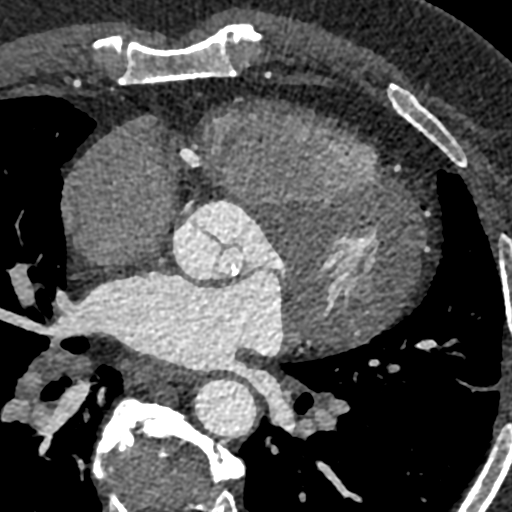
[im 3110/3888  vessel]
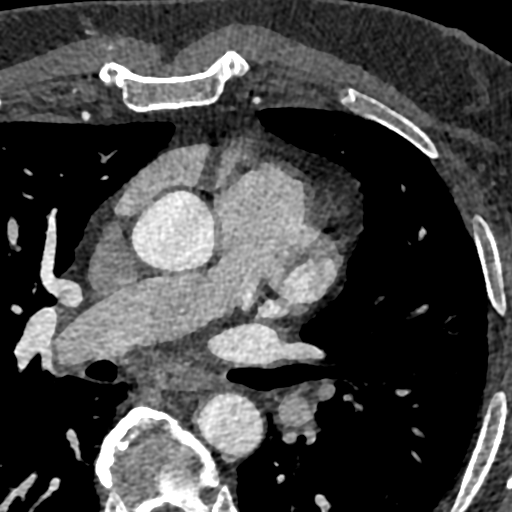

[4 of 20 positions shown; findings below may reference images not displayed]



Aortic Valve:  Trileaflet.  mild calcifications.

Coronary Arteries:  Normal coronary origin.  Right dominance.

RCA is a dominant artery that gives rise to PDA and PLA. There is
minimal calcified plaque causing minimal mid RCA stenosis (<25%).

Left main gives rise to LAD and LCX arteries. There is no LM disease

LAD has proximal calcified plaque causing mild stenosis (25%) in the
proximal segment.

LCX is a non-dominant artery that gives rise to one OM1 branch.
There is no plaque.

Other findings:

Normal pulmonary vein drainage into the left atrium.

Normal left atrial appendage without a thrombus.

Normal size of the pulmonary artery.
IMPRESSION: 1. Coronary calcium score of 107. This was 43rd percentile for age
and sex matched control.

2. Normal coronary origin with right dominance.

3. Calcified plaque causing mild stenosis in the proximal LAD.

4. Calcified plaque causing minimal stenosis in the mid RCA.

5. CAD-RADS 2. Mild non-obstructive CAD (25-49%). Consider
non-atherosclerotic causes of chest pain. Consider preventive
therapy and risk factor modification.

EXAM:
OVER-READ INTERPRETATION  CT CHEST

The following report is an over-read performed by radiologist Dr.
over-read does not include interpretation of cardiac or coronary
anatomy or pathology. The coronary calcium score and coronary CT
angiography interpretation by the cardiologist is attached.
FINDINGS: Cardiovascular: See dedicated report for cardiovascular details.

Mediastinum/Nodes: Unremarkable to the extent evaluated. No
adenopathy in the visualized chest.

Lungs/Pleura: Minimal atelectasis.  No effusion.  No consolidation.

Upper Abdomen: Unremarkable to the extent evaluated.

Musculoskeletal: No acute bone finding. No destructive bone process.
Spinal degenerative changes.
IMPRESSION: No acute or significant extracardiac findings.



Aortic Valve:  Trileaflet.  mild calcifications.

Coronary Arteries:  Normal coronary origin.  Right dominance.

RCA is a dominant artery that gives rise to PDA and PLA. There is
minimal calcified plaque causing minimal mid RCA stenosis (<25%).

Left main gives rise to LAD and LCX arteries. There is no LM disease

LAD has proximal calcified plaque causing mild stenosis (25%) in the
proximal segment.

LCX is a non-dominant artery that gives rise to one OM1 branch.
There is no plaque.

Other findings:

Normal pulmonary vein drainage into the left atrium.

Normal left atrial appendage without a thrombus.

Normal size of the pulmonary artery.
IMPRESSION: 1. Coronary calcium score of 107. This was 43rd percentile for age
and sex matched control.

2. Normal coronary origin with right dominance.

3. Calcified plaque causing mild stenosis in the proximal LAD.

4. Calcified plaque causing minimal stenosis in the mid RCA.

5. CAD-RADS 2. Mild non-obstructive CAD (25-49%). Consider
non-atherosclerotic causes of chest pain. Consider preventive
therapy and risk factor modification.

## 2021-09-21 MED ORDER — DILTIAZEM HCL 25 MG/5ML IV SOLN
10.0000 mg | Freq: Once | INTRAVENOUS | Status: AC
  Administered 2021-09-21: 16:00:00 10 mg via INTRAVENOUS

## 2021-09-21 MED ORDER — METOPROLOL TARTRATE 5 MG/5ML IV SOLN
10.0000 mg | Freq: Once | INTRAVENOUS | Status: AC
  Administered 2021-09-21: 16:00:00 10 mg via INTRAVENOUS

## 2021-09-21 MED ORDER — IOHEXOL 350 MG/ML SOLN
100.0000 mL | Freq: Once | INTRAVENOUS | Status: AC | PRN
  Administered 2021-09-21: 16:00:00 100 mL via INTRAVENOUS

## 2021-09-21 MED ORDER — NITROGLYCERIN 0.4 MG SL SUBL
0.8000 mg | SUBLINGUAL_TABLET | Freq: Once | SUBLINGUAL | Status: AC
  Administered 2021-09-21: 16:00:00 0.8 mg via SUBLINGUAL

## 2021-11-19 ENCOUNTER — Other Ambulatory Visit: Payer: Self-pay | Admitting: Gastroenterology

## 2021-12-07 ENCOUNTER — Telehealth: Payer: Self-pay

## 2021-12-07 NOTE — Telephone Encounter (Signed)
Updated specimen tracking and history from [email or web address]. aw 

## 2021-12-22 ENCOUNTER — Ambulatory Visit: Payer: Medicare Other | Admitting: Dermatology

## 2021-12-22 ENCOUNTER — Encounter: Payer: Self-pay | Admitting: Dermatology

## 2021-12-22 ENCOUNTER — Other Ambulatory Visit: Payer: Self-pay

## 2021-12-22 ENCOUNTER — Other Ambulatory Visit: Payer: Self-pay | Admitting: Neurosurgery

## 2021-12-22 DIAGNOSIS — D229 Melanocytic nevi, unspecified: Secondary | ICD-10-CM

## 2021-12-22 DIAGNOSIS — L578 Other skin changes due to chronic exposure to nonionizing radiation: Secondary | ICD-10-CM | POA: Diagnosis not present

## 2021-12-22 DIAGNOSIS — Z1283 Encounter for screening for malignant neoplasm of skin: Secondary | ICD-10-CM | POA: Diagnosis not present

## 2021-12-22 DIAGNOSIS — M5416 Radiculopathy, lumbar region: Secondary | ICD-10-CM

## 2021-12-22 DIAGNOSIS — Z85820 Personal history of malignant melanoma of skin: Secondary | ICD-10-CM | POA: Diagnosis not present

## 2021-12-22 DIAGNOSIS — L570 Actinic keratosis: Secondary | ICD-10-CM

## 2021-12-22 DIAGNOSIS — L2089 Other atopic dermatitis: Secondary | ICD-10-CM | POA: Diagnosis not present

## 2021-12-22 DIAGNOSIS — L814 Other melanin hyperpigmentation: Secondary | ICD-10-CM

## 2021-12-22 DIAGNOSIS — D1800 Hemangioma unspecified site: Secondary | ICD-10-CM

## 2021-12-22 DIAGNOSIS — M545 Low back pain, unspecified: Secondary | ICD-10-CM

## 2021-12-22 DIAGNOSIS — L821 Other seborrheic keratosis: Secondary | ICD-10-CM

## 2021-12-22 DIAGNOSIS — Z85828 Personal history of other malignant neoplasm of skin: Secondary | ICD-10-CM

## 2021-12-22 NOTE — Patient Instructions (Addendum)

## 2021-12-22 NOTE — Progress Notes (Signed)
Follow-Up Visit   Subjective  Donald [name] is a 71 y.o. male who presents for the following: Annual Exam (Hx MM, BCC ). The patient presents for Total-Body Skin Exam (TBSE) for skin cancer screening and mole check.  The patient has spots, moles and lesions to be evaluated, some may be new or changing.  The following portions of the chart were reviewed this encounter and updated as appropriate:   Tobacco   Allergies   Meds   Problems   Med Hx   Surg Hx   Fam Hx      Review of Systems:  No other skin or systemic complaints except as noted in HPI or Assessment and Plan.  Objective  Well appearing patient in no apparent distress; mood and affect are within normal limits.  A full examination was performed including scalp, head, eyes, ears, nose, lips, neck, chest, axillae, abdomen, back, buttocks, bilateral upper extremities, bilateral lower extremities, hands, feet, fingers, toes, fingernails, and toenails. All findings within normal limits unless otherwise noted below.  L forearm x 2 (2) Erythematous thin papules/macules with gritty scale.   Post auricular Clear today.    Assessment & Plan  AK (actinic keratosis) (2) L forearm x 2  Destruction of lesion - L forearm x 2 Complexity: simple   Destruction method: cryotherapy   Informed consent: discussed and consent obtained   Timeout:  patient name, date of birth, surgical site, and procedure verified Lesion destroyed using liquid nitrogen: Yes   Region frozen until ice ball extended beyond lesion: Yes   Outcome: patient tolerated procedure well with no complications   Post-procedure details: wound care instructions given    Other atopic dermatitis Post auricular  Atopic dermatitis (eczema) is a chronic, relapsing, pruritic condition that can significantly affect quality of life. It is often associated with allergic rhinitis and/or asthma and can require treatment with topical medications, phototherapy, or in severe cases  biologic injectable medication (Dupixent; Adbry) or Oral JAK inhibitors.  Continue OTC Cortaid PRN.   Skin cancer screening  Lentigines - Scattered tan macules - Due to sun exposure - Benign-appearing, observe - Recommend daily broad spectrum sunscreen SPF 30+ to sun-exposed areas, reapply every 2 hours as needed. - Call for any changes  Seborrheic Keratoses - Stuck-on, waxy, tan-brown papules and/or plaques  - Benign-appearing - Discussed benign etiology and prognosis. - Observe - Call for any changes  Melanocytic Nevi - Tan-brown and/or pink-flesh-colored symmetric macules and papules - Benign appearing on exam today - Observation - Call clinic for new or changing moles - Recommend daily use of broad spectrum spf 30+ sunscreen to sun-exposed areas.   Hemangiomas - Red papules - Discussed benign nature - Observe - Call for any changes  Actinic Damage - Chronic condition, secondary to cumulative UV/sun exposure - diffuse scaly erythematous macules with underlying dyspigmentation - Recommend daily broad spectrum sunscreen SPF 30+ to sun-exposed areas, reapply every 2 hours as needed.  - Staying in the shade or wearing long sleeves, sun glasses (UVA+UVB protection) and wide brim hats (4-inch brim around the entire circumference of the hat) are also recommended for sun protection.  - Call for new or changing lesions.  History of Basal Cell Carcinoma of the Skin - No evidence of recurrence today - Recommend regular full body skin exams - Recommend daily broad spectrum sunscreen SPF 30+ to sun-exposed areas, reapply every 2 hours as needed.  - Call if any new or changing lesions are noted between office visits  History of Melanoma - No evidence of recurrence today - No lymphadenopathy - Recommend regular full body skin exams - Recommend daily broad spectrum sunscreen SPF 30+ to sun-exposed areas, reapply every 2 hours as needed.  - Call if any new or changing lesions are  noted between office visits  Skin cancer screening performed today.  Return in about 3 months (around 03/21/2022) for TBSE - Hx MM, BCC .  Luther Redo, CMA, am acting as scribe for [name], MD . Documentation: I have reviewed the above documentation for accuracy and completeness, and I agree with the above.  [name], MD

## 2021-12-24 ENCOUNTER — Encounter: Payer: Self-pay | Admitting: Dermatology

## 2021-12-31 ENCOUNTER — Ambulatory Visit
Admission: RE | Admit: 2021-12-31 | Discharge: 2021-12-31 | Disposition: A | Discharge status: Home / Self Care | Payer: Medicare Other | Source: Ambulatory Visit | Attending: Neurosurgery | Admitting: Neurosurgery

## 2021-12-31 DIAGNOSIS — M545 Low back pain, unspecified: Secondary | ICD-10-CM | POA: Diagnosis present

## 2021-12-31 DIAGNOSIS — M5416 Radiculopathy, lumbar region: Secondary | ICD-10-CM

## 2021-12-31 DIAGNOSIS — G8929 Other chronic pain: Secondary | ICD-10-CM | POA: Diagnosis present

## 2021-12-31 IMAGING — MR MR LUMBAR SPINE W/O CM
5 series · 31 of 48 positions shown · non-contrast
Comparison: None.

CLINICAL DATA: Lower back pain, tightness, numbness and weakness in
left leg for 8 months. History of lumbar spine surgery.

EXAM:
MRI LUMBAR SPINE WITHOUT CONTRAST
TECHNIQUE: Multiplanar, multisequence MR imaging of the lumbar spine was
performed. No intravenous contrast was administered.

[Series 5: T2 · sagittal · 4.0mm · 0.81mm/px · 6 of 17 slices shown (1 of 2)]
[im 1/17]
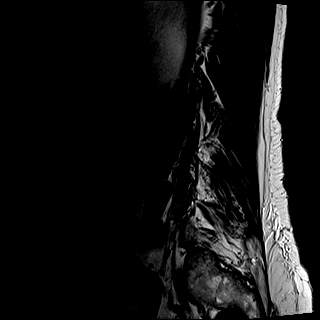
[im 4/17]
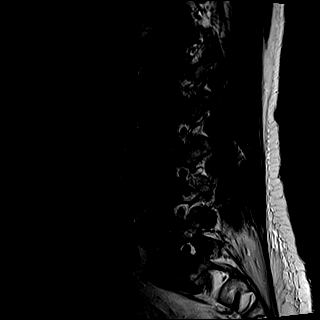
[im 7/17]
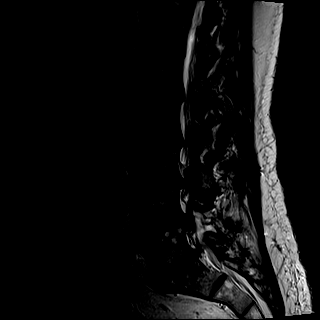
[im 10/17]
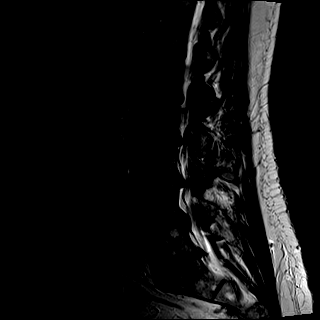
[im 13/17]
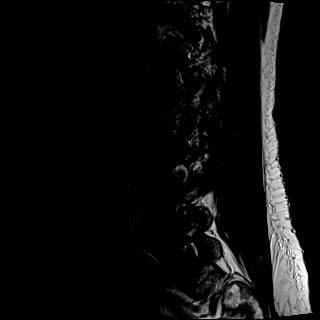
[im 17/17]
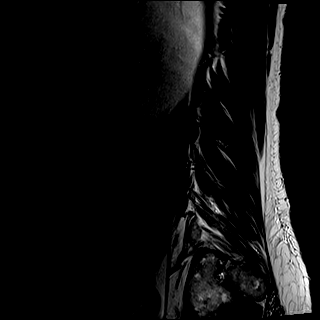

[Series 6: T1 · sagittal · 4.0mm · 0.81mm/px · 6 of 17 slices shown (1 of 2)]
[im 1/17]
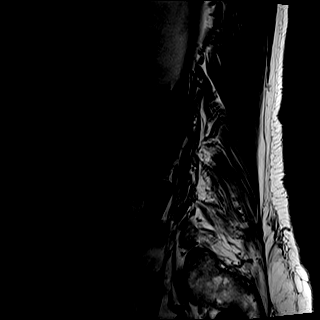
[im 4/17]
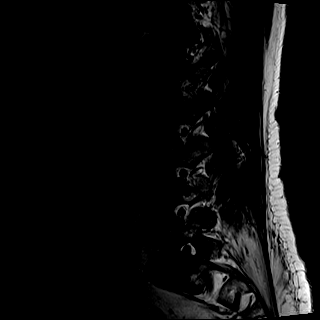
[im 7/17]
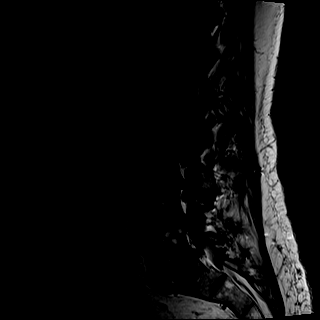
[im 10/17]
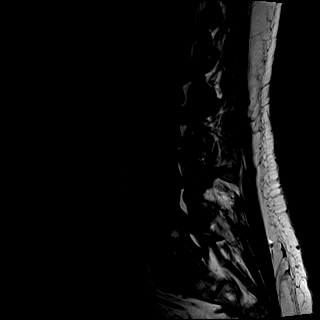
[im 13/17]
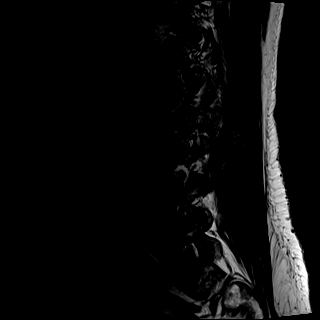
[im 17/17]
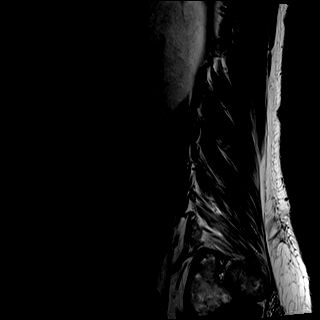

[Series 7: STIR · sagittal · 4.0mm · 0.41mm/px · 1 of 17 slices shown]
[im 1/17]
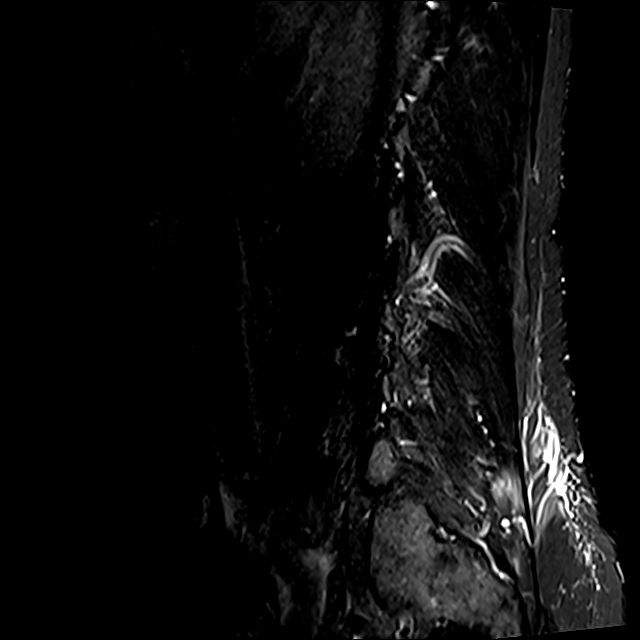

[Series 8: T2 · axial · 4.0mm · 0.78mm/px · z∈[-110,+107]mm · 9 of 39 slices shown (2 of 2)]
[im 1/39]
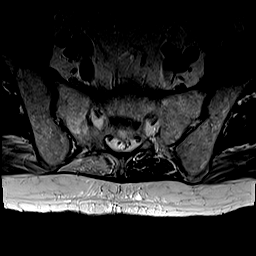
[im 6/39]
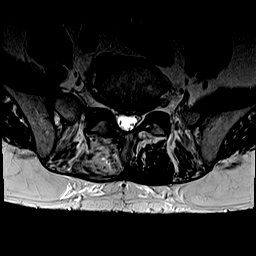
[im 11/39]
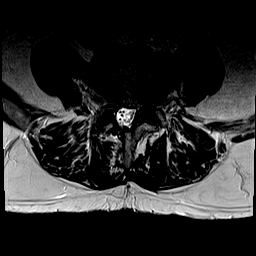
[im 17/39]
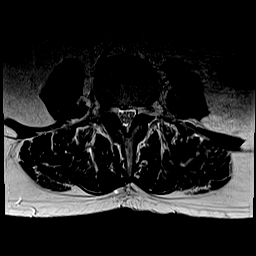
[im 20/39]
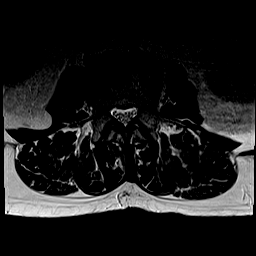
[im 22/39]
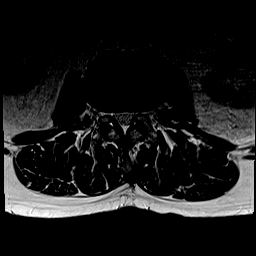
[im 28/39]
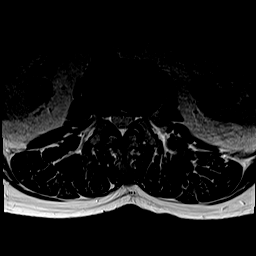
[im 33/39]
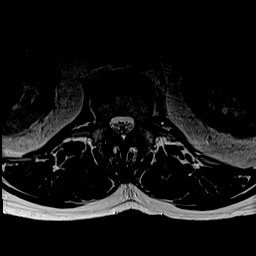
[im 39/39]
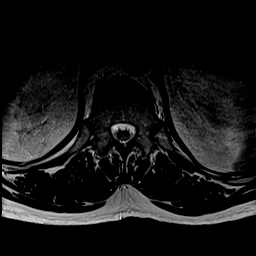

[Series 9: T1 · axial · 4.0mm · 0.39mm/px · z∈[-110,+107]mm · 9 of 39 slices shown (2 of 2)]
[im 1/39]
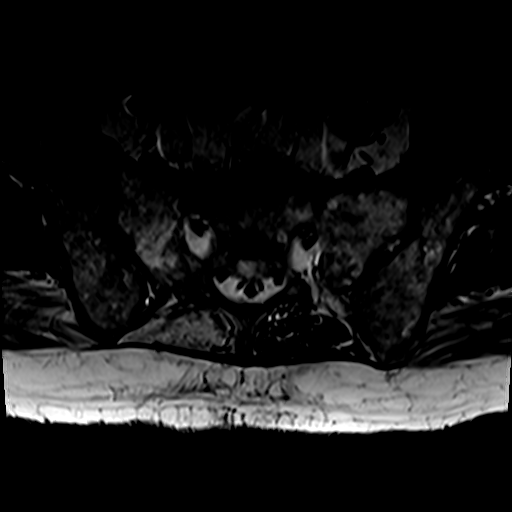
[im 6/39]
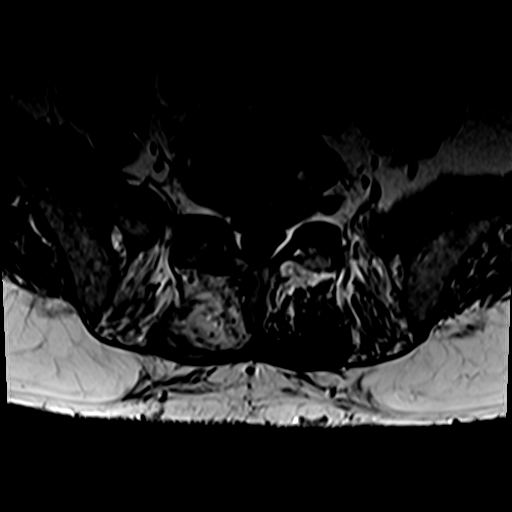
[im 11/39]
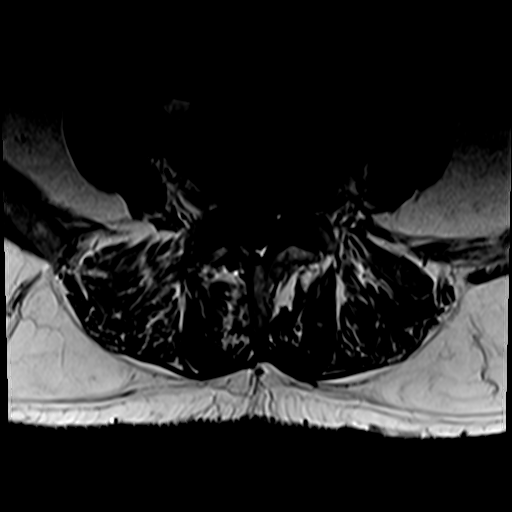
[im 17/39]
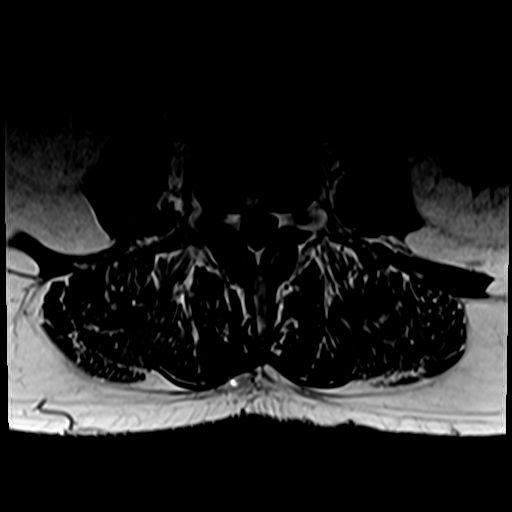
[im 20/39]
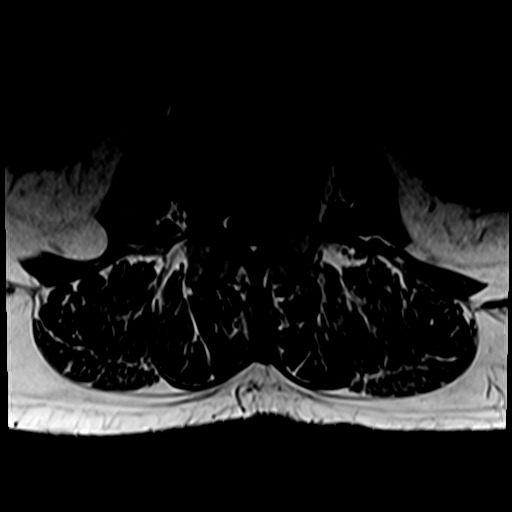
[im 22/39]
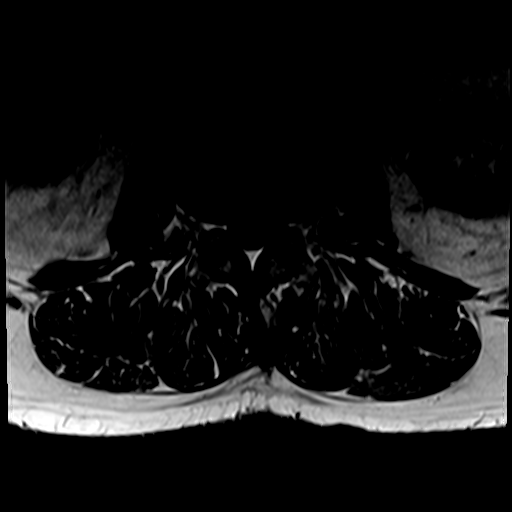
[im 28/39]
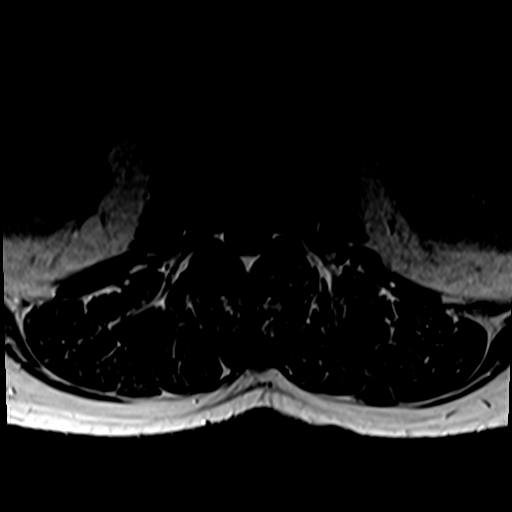
[im 33/39]
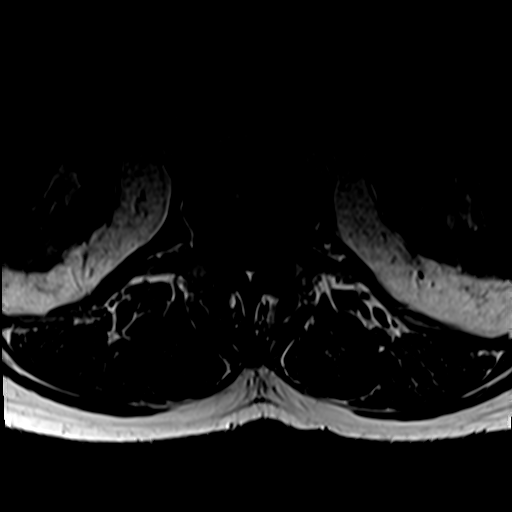
[im 39/39]
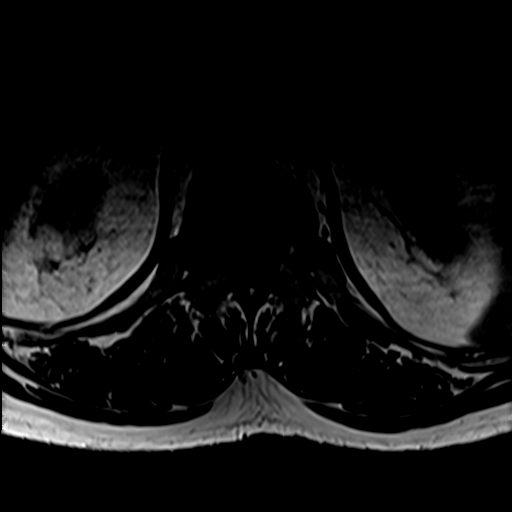

[31 of 48 positions shown; findings below may reference images not displayed]

FINDINGS: Segmentation:  Standard.

Alignment:  Physiologic.

Vertebrae: No acute fracture, evidence of discitis, or aggressive
bone lesion.

Conus medullaris and cauda equina: Conus extends to the T12-L1
level. Conus and cauda equina appear normal.

Paraspinal and other soft tissues: No acute paraspinal abnormality.

Disc levels:

Disc spaces: Disc desiccation throughout the lumbar spine. Disc
height loss at L2-3, L3-4, L4-5 and L5-S1.

T12-L1: Minimal broad-based disc bulge. Mild bilateral facet
arthropathy. No foraminal or central canal stenosis.

L1-L2: Mild broad-based disc bulge with a central annular fissure.
Mild bilateral facet arthropathy. Mild spinal stenosis. Mild
bilateral foraminal stenosis.

L2-L3: Broad-based disc bulge. Mild bilateral facet arthropathy.
Mild spinal stenosis. No foraminal stenosis.

L3-L4: Broad-based disc bulge with a small central disc protrusion.
Moderate bilateral facet arthropathy. Severe spinal stenosis. No
foraminal stenosis. Bilateral subarticular recess stenosis.

L4-L5: Broad-based disc bulge. Right laminectomy. Moderate bilateral
facet arthropathy. Mild left foraminal stenosis. No right foraminal
stenosis. No spinal stenosis. Bilateral subarticular recess
stenosis.

L5-S1: Broad-based disc bulge with a central annular fissure. Mild
bilateral facet arthropathy. No foraminal or central canal stenosis.
IMPRESSION: 1. Diffuse lumbar spine spondylosis as described above.
2. No acute osseous injury of the lumbar spine.

## 2022-02-05 ENCOUNTER — Encounter: Payer: Self-pay | Admitting: Gastroenterology

## 2022-02-22 ENCOUNTER — Encounter: Payer: Self-pay | Admitting: Gastroenterology

## 2022-02-22 ENCOUNTER — Ambulatory Visit
Admission: RE | Admit: 2022-02-22 | Discharge: 2022-02-22 | Disposition: A | Discharge status: Home / Self Care | Payer: Medicare Other | Source: Ambulatory Visit | Attending: Gastroenterology | Admitting: Gastroenterology

## 2022-02-22 ENCOUNTER — Encounter
Admission: RE | Disposition: A | Discharge status: Home / Self Care | Payer: Self-pay | Source: Ambulatory Visit | Attending: Gastroenterology

## 2022-02-22 ENCOUNTER — Ambulatory Visit: Payer: Medicare Other | Admitting: Anesthesiology

## 2022-02-22 DIAGNOSIS — K640 First degree hemorrhoids: Secondary | ICD-10-CM | POA: Insufficient documentation

## 2022-02-22 DIAGNOSIS — E119 Type 2 diabetes mellitus without complications: Secondary | ICD-10-CM | POA: Diagnosis not present

## 2022-02-22 DIAGNOSIS — Z86010 Personal history of colonic polyps: Secondary | ICD-10-CM | POA: Insufficient documentation

## 2022-02-22 DIAGNOSIS — Z96641 Presence of right artificial hip joint: Secondary | ICD-10-CM | POA: Insufficient documentation

## 2022-02-22 DIAGNOSIS — D124 Benign neoplasm of descending colon: Secondary | ICD-10-CM | POA: Diagnosis not present

## 2022-02-22 DIAGNOSIS — Z1211 Encounter for screening for malignant neoplasm of colon: Secondary | ICD-10-CM | POA: Diagnosis present

## 2022-02-22 DIAGNOSIS — Z96653 Presence of artificial knee joint, bilateral: Secondary | ICD-10-CM | POA: Insufficient documentation

## 2022-02-22 DIAGNOSIS — D123 Benign neoplasm of transverse colon: Secondary | ICD-10-CM | POA: Insufficient documentation

## 2022-02-22 HISTORY — PX: COLONOSCOPY: SHX5424

## 2022-02-22 HISTORY — DX: Prediabetes: R73.03

## 2022-02-22 LAB — GLUCOSE, CAPILLARY: Glucose-Capillary: 127 mg/dL — ABNORMAL HIGH (ref 70–99)

## 2022-02-22 SURGERY — COLONOSCOPY
Anesthesia: General

## 2022-02-22 MED ORDER — SODIUM CHLORIDE 0.9 % IV SOLN: INTRAVENOUS | Status: DC

## 2022-02-22 MED ORDER — PROPOFOL 500 MG/50ML IV EMUL
INTRAVENOUS | Status: AC
  Filled 2022-02-22: qty 50

## 2022-02-22 MED ORDER — LIDOCAINE 2% (20 MG/ML) 5 ML SYRINGE
INTRAMUSCULAR | Status: DC | PRN
Start: 2022-02-22 — End: 2022-02-22
  Administered 2022-02-22: 50 mg via INTRAVENOUS

## 2022-02-22 MED ORDER — PROPOFOL 10 MG/ML IV BOLUS
INTRAVENOUS | Status: DC | PRN
  Administered 2022-02-22: 100 mg via INTRAVENOUS

## 2022-02-22 MED ORDER — PROPOFOL 500 MG/50ML IV EMUL
INTRAVENOUS | Status: DC | PRN
  Administered 2022-02-22: 175 ug/kg/min via INTRAVENOUS

## 2022-02-22 NOTE — Op Note (Signed)
[facility] ?Gastroenterology ?Patient Name: Donald [name] ?Procedure Date: 02/22/2022 8:53 AM ?MRN: [id number] ?Account #: [id number] ?Date of Birth: 15-Dec-1950 ?Admit Type: Outpatient ?Age: 71 ?Room: [facility] ENDO ROOM 2 ?Gender: Male ?Note Status: Finalized ?Instrument Name: Colonscope [id number] ?Procedure:             Colonoscopy ?Indications:           High risk colon cancer surveillance: Personal history  ?                       of colonic polyps ?Providers:             Annamaria Helling DO, DO ?Medicines:             Monitored Anesthesia Care ?Complications:         No immediate complications. Estimated blood loss:  ?                       Minimal. ?Procedure:             Pre-Anesthesia Assessment: ?                       - Prior to the procedure, a History and Physical was  ?                       performed, and patient medications and allergies were  ?                       reviewed. The patient is competent. The risks and  ?                       benefits of the procedure and the sedation options and  ?                       risks were discussed with the patient. All questions  ?                       were answered and informed consent was obtained.  ?                       Patient identification and proposed procedure were  ?                       verified by the physician, the nurse, the anesthetist  ?                       and the technician in the endoscopy suite. Mental  ?                       Status Examination: alert and oriented. Airway  ?                       Examination: normal oropharyngeal airway and neck  ?                       mobility. Respiratory Examination: clear to  ?                       auscultation. CV Examination: RRR, no murmurs, no S3  ?  or S4. Prophylactic Antibiotics: The patient does not  ?                       require prophylactic antibiotics. Prior  ?                       Anticoagulants: The patient has taken no previous  ?                        anticoagulant or antiplatelet agents. ASA Grade  ?                       Assessment: II - A patient with mild systemic disease.  ?                       After reviewing the risks and benefits, the patient  ?                       was deemed in satisfactory condition to undergo the  ?                       procedure. The anesthesia plan was to use monitored  ?                       anesthesia care (MAC). Immediately prior to  ?                       administration of medications, the patient was  ?                       re-assessed for adequacy to receive sedatives. The  ?                       heart rate, respiratory rate, oxygen saturations,  ?                       blood pressure, adequacy of pulmonary ventilation, and  ?                       response to care were monitored throughout the  ?                       procedure. The physical status of the patient was  ?                       re-assessed after the procedure. ?                       After obtaining informed consent, the colonoscope was  ?                       passed under direct vision. Throughout the procedure,  ?                       the patient's blood pressure, pulse, and oxygen  ?                       saturations were monitored continuously. The  ?  Colonoscope was introduced through the anus and  ?                       advanced to the the terminal ileum, with  ?                       identification of the appendiceal orifice and IC  ?                       valve. The colonoscopy was performed without  ?                       difficulty. The patient tolerated the procedure well.  ?                       The quality of the bowel preparation was evaluated  ?                       using the BBPS [facility] Bowel Preparation Scale) with  ?                       scores of: Right Colon = 3 (entire mucosa seen well  ?                       with no residual staining, small fragments of stool or  ?                       opaque  liquid), Transverse Colon = 3 (entire mucosa  ?                       seen well with no residual staining, small fragments  ?                       of stool or opaque liquid) and Left Colon = 2 (minor  ?                       amount of residual staining, small fragments of stool  ?                       and/or opaque liquid, but mucosa seen well). The total  ?                       BBPS score equals 8. The quality of the bowel  ?                       preparation was excellent. The terminal ileum,  ?                       ileocecal valve, appendiceal orifice, and rectum were  ?                       photographed. ?Findings: ?     The perianal and digital rectal examinations were normal. Pertinent  ?     negatives include normal sphincter tone. ?     The terminal ileum appeared normal. Estimated blood loss: none. ?     Non-bleeding internal hemorrhoids were found during retroflexion. The  ?  hemorrhoids were Grade I (internal hemorrhoids that do not prolapse).  ?     Estimated blood loss: none. ?     Four sessile polyps were found in the sigmoid colon, descending colon  ?     and transverse colon. The polyps were 1 to 2 mm in size. These polyps  ?     were removed with a jumbo cold forceps. Resection and retrieval were  ?     complete. Estimated blood loss was minimal. ?     A 3 to 4 mm polyp was found in the descending colon. The polyp was  ?     sessile. The polyp was removed with a cold snare. Resection and  ?     retrieval were complete. Estimated blood loss was minimal. ?     The exam was otherwise without abnormality on direct and retroflexion  ?     views. ?     There was a lipoma, 20 mm in diameter, in the transverse colon.  ?     Estimated blood loss: none. + pillow sign; previously reported on last  ?     colonoscopy ?Impression:            - The examined portion of the ileum was normal. ?                       - Non-bleeding internal hemorrhoids. ?                       - Four 1 to 2 mm polyps in the  sigmoid colon, in the  ?                       descending colon and in the transverse colon, removed  ?                       with a jumbo cold forceps. Resected and retrieved. ?                       - One 3 to 4 mm polyp in the descending colon, removed  ?                       with a cold snare. Resected and retrieved. ?                       - The examination was otherwise normal on direct and  ?                       retroflexion views. ?Recommendation:        - Discharge patient to home. ?                       - Resume previous diet. ?                       - No aspirin, ibuprofen, naproxen, or other  ?                       non-steroidal anti-inflammatory drugs for 5 days after  ?                       polyp removal. ?                       -  Continue present medications. ?                       - Await pathology results. ?                       - Return to referring physician as previously  ?                       scheduled. ?                       - Repeat colonoscopy for surveillance based on  ?                       pathology results. ?Procedure Code(s):     --- Professional --- ?                       [id number], Colonoscopy, flexible; with removal of  ?                       tumor(s), polyp(s), or other lesion(s) by snare  ?                       technique ?                       45380, 59, Colonoscopy, flexible; with biopsy, single  ?                       or multiple ?Diagnosis Code(s):     --- Professional --- ?                       K63.5, Polyp of colon ?                       Z86.010, Personal history of colonic polyps ?                       K64.0, First degree hemorrhoids ?CPT copyright 2019 [employer]. All rights reserved. ?The codes documented in this report are preliminary and upon coder review may  ?be revised to meet current compliance requirements. ?Attending Participation: ?     I personally performed the entire procedure. ?Volney American, DO ?Annamaria Helling DO,  DO ?02/22/2022 9:47:13 AM ?This report has been signed electronically. ?Number of Addenda: 0 ?Note Initiated On: 02/22/2022 8:53 AM ?Scope Withdrawal Time: 0 hours 21 minutes 8 seconds  ?Total Procedure Duration:

## 2022-02-22 NOTE — H&P (Signed)
? ?Pre-Procedure H&P ?  ?Patient ID: Donald [name] is a 71 y.o. male. ? ?Gastroenterology Provider: Annamaria Helling, DO ? ?Referring Provider: Dawson Bills, NP ?PCP: Baxter Hire, MD ? ?Date: 02/22/2022 ? ?HPI ?Donald [name] is a 71 y.o. male who presents today for Colonoscopy for surveillance-personal history of colon polyps. ?Normal bowel movements without diarrhea constipation hematochezia or melena. ?Patient's last colonoscopy in May 2017 1 sessile serrated polyp in the transverse colon.  Other polyps are noted to be hyperplastic.  An approximately 20 mm lipoma was noted in the transverse colon. ?2012 colonoscopy noted 1 tubular adenoma ?Patient status post right hip replacement and bilateral knees. ?Last reported labs creatinine 1.0 hemoglobin 14.1 MCV 81 platelets 311,000 A1c 6.2 ? ?Past Medical History:  ?Diagnosis Date  ? Arthritis   ? Basal cell carcinoma 02/23/2008  ? Left shoulder.   ? COVID-19 06/2020  ? ED (erectile dysfunction)   ? Hx of basal cell carcinoma 01/24/2017  ? Left lateral canthus. Nodular pattern. Tx: EDC  ? Hyperlipidemia   ? history of elevated cholesterol, better after pt started exercising.  ? Lung nodule   ? SEES [facility]EROV  ? Melanoma ([place]) 02/25/2021  ? 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 PONV (postoperative nausea and vomiting)   ? 40 years ago with hernia surgery.  ? Pre-diabetes   ? ? ?Past Surgical History:  ?Procedure Laterality Date  ? BACK SURGERY  1984  ? L4-5  ? CATARACT EXTRACTION W/PHACO Right 06/03/2015  ? Procedure: CATARACT EXTRACTION PHACO AND INTRAOCULAR LENS PLACEMENT (IOC);  Surgeon: Birder Robson, MD;  Location: [facility] ORS;  Service: Ophthalmology;  Laterality: Right;  [place]: 01:26.5AP%: 19.7CDE: 17.07Fluid lot# [id number]  ? COLONOSCOPY N/A 08/16/2016  ? Procedure: COLONOSCOPY;  Surgeon: Lollie Sails, MD;  Location: [facility] ENDOSCOPY;   Service: Endoscopy;  Laterality: N/A;  ? CYSTOSCOPY WITH INSERTION OF UROLIFT N/A 12/11/2020  ? Procedure: CYSTOSCOPY WITH INSERTION OF UROLIFT;  Surgeon: Royston Cowper, MD;  Location: [facility] ORS;  Service: Urology;  Laterality: N/A;  ? EYE SURGERY    ? FRACTURE SURGERY Right 1976  ? ankle  ? HERNIA REPAIR Left 1972  ? nausea and vomiting with general anesthesia  ? JOINT REPLACEMENT Left 2012  ? JOINT REPLACEMENT Right 2014  ? knees replaced    ? TOTAL HIP ARTHROPLASTY Right 12/08/2016  ? Procedure: TOTAL HIP ARTHROPLASTY;  Surgeon: Dereck Leep, MD;  Location: [facility] ORS;  Service: Orthopedics;  Laterality: Right;  ? ? ?Family History ?No h/o GI disease or malignancy ? ?Review of Systems  ?Constitutional:  Negative for activity change, appetite change, chills, diaphoresis, fatigue, fever and unexpected weight change.  ?HENT:  Negative for trouble swallowing and voice change.   ?Respiratory:  Negative for shortness of breath and wheezing.   ?Cardiovascular:  Negative for chest pain, palpitations and leg swelling.  ?Gastrointestinal:  Negative for abdominal distention, abdominal pain, anal bleeding, blood in stool, constipation, diarrhea, nausea and vomiting.  ?Musculoskeletal:  Negative for arthralgias and myalgias.  ?Skin:  Negative for color change and pallor.  ?Neurological:  Negative for dizziness, syncope and weakness.  ?Psychiatric/Behavioral:  Negative for confusion. The patient is not nervous/anxious.   ?All other systems reviewed and are negative.  ? ?Medications ?No current facility-administered medications on file prior to encounter.  ? ?Current Outpatient Medications on File Prior to Encounter  ?Medication Sig Dispense  Refill  ? Multiple Vitamin (MULTIVITAMIN WITH MINERALS) TABS tablet Take 1 tablet by mouth daily.    ? aspirin 325 MG tablet Take 325 mg by mouth daily.    ? ciprofloxacin (CIPRO) 500 MG tablet Take 1 tablet (500 mg total) by mouth 2 (two) times daily. (Patient not taking: Reported on  09/21/2021) 6 tablet 0  ? fluticasone (FLONASE) 50 MCG/ACT nasal spray Place 1 spray into both nostrils daily as needed for allergies.    ? imiquimod (ALDARA) 5 % cream Apply topically at bedtime. Qhs aa left forehead for 4 weeks (Patient not taking: Reported on 09/21/2021) 24 each 2  ? ketoconazole (NIZORAL) 2 % cream Apply twice daily to feet until 1 week post clearance (Patient not taking: Reported on 09/21/2021) 60 g 1  ? Meth-Hyo-M Bl-Na Phos-Ph Sal (URIBEL) 118 MG CAPS Take 1 capsule (118 mg total) by mouth every 6 (six) hours as needed (dysuria). (Patient not taking: Reported on 09/21/2021) 40 capsule 3  ? metoprolol tartrate (LOPRESSOR) 100 MG tablet Take 1 tablet (100 mg total) by mouth once for 1 dose. Please take one time dose '100mg'$  metoprolol tartrate 2 hr prior to cardiac CT for HR control IF HR >55bpm. 1 tablet 0  ? mometasone (ELOCON) 0.1 % cream Apply 1 application topically daily as needed (Rash). (Patient not taking: Reported on 09/21/2021) 45 g 0  ? Polyethyl Glycol-Propyl Glycol (SYSTANE OP) Place 1 drop into both eyes daily as needed (dry eyes).    ? sildenafil (REVATIO) 20 MG tablet Take 20 mg by mouth daily.     ? Testosterone (AXIRON TD) Place 1 application onto the skin daily. Testosterone 200 mg cream    ? TURMERIC CURCUMIN PO Take 1 tablet by mouth daily. With coq 10    ? Zinc 30 MG TABS Take 30 mg by mouth daily.    ? ? ?Pertinent medications related to GI and procedure were reviewed by me with the patient prior to the procedure ? ? ?Current Facility-Administered Medications:  ?  0.9 %  sodium chloride infusion, , Intravenous, Continuous, Annamaria Helling, DO, Last Rate: 20 mL/hr at 02/22/22 0827, New Bag at 02/22/22 0827 ?  ?  ? ?No Known Allergies ?Allergies were reviewed by me prior to the procedure ? ?Objective  ? ?Body mass index is 33.91 kg/m?. ?Vitals:  ? 02/22/22 0809  ?BP: (!) 155/77  ?Pulse: 71  ?Resp: 20  ?Temp: (!) 96.9 ?F (36.1 ?C)  ?TempSrc: Temporal  ?SpO2: 98%   ?Weight: 113.4 kg  ?Height: 6' (1.829 m)  ? ? ? ?Physical Exam ?Vitals and nursing note reviewed.  ?Constitutional:   ?   General: He is not in acute distress. ?   Appearance: Normal appearance. He is obese. He is not ill-appearing, toxic-appearing or diaphoretic.  ?HENT:  ?   Head: Normocephalic and atraumatic.  ?   Nose: Nose normal.  ?   Mouth/Throat:  ?   Mouth: Mucous membranes are moist.  ?   Pharynx: Oropharynx is clear.  ?Eyes:  ?   General: No scleral icterus. ?   Extraocular Movements: Extraocular movements intact.  ?Cardiovascular:  ?   Rate and Rhythm: Normal rate and regular rhythm.  ?   Heart sounds: Normal heart sounds. No murmur heard. ?  No friction rub. No gallop.  ?Pulmonary:  ?   Effort: Pulmonary effort is normal. No respiratory distress.  ?   Breath sounds: Normal breath sounds. No wheezing, rhonchi or rales.  ?Abdominal:  ?  General: Bowel sounds are normal. There is no distension.  ?   Palpations: Abdomen is soft.  ?   Tenderness: There is no abdominal tenderness. There is no guarding or rebound.  ?Musculoskeletal:  ?   Cervical back: Neck supple.  ?   Right lower leg: No edema.  ?   Left lower leg: No edema.  ?Skin: ?   General: Skin is warm and dry.  ?   Coloration: Skin is not jaundiced or pale.  ?Neurological:  ?   General: No focal deficit present.  ?   Mental Status: He is alert and oriented to person, place, and time. Mental status is at baseline.  ?Psychiatric:     ?   Mood and Affect: Mood normal.     ?   Behavior: Behavior normal.     ?   Thought Content: Thought content normal.     ?   Judgment: Judgment normal.  ? ? ? ?Assessment:  ?Donald [name] is a 70 y.o. male  who presents today for Colonoscopy for surveillance-personal history of colon polyps. ? ?Plan:  ?Colonoscopy with possible intervention today ? ?Colonoscopy with possible biopsy, control of bleeding, polypectomy, and interventions as necessary has been discussed with the patient/patient representative. Informed  consent was obtained from the patient/patient representative after explaining the indication, nature, and risks of the procedure including but not limited to death, bleeding, perforation, missed neoplasm/lesions,

## 2022-02-22 NOTE — Anesthesia Postprocedure Evaluation (Signed)
Anesthesia Post Note ? ?Patient: Donald [name] ? ?Procedure(s) Performed: COLONOSCOPY ? ?Patient location during evaluation: Endoscopy ?Anesthesia Type: General ?Level of consciousness: awake and alert ?Pain management: pain level controlled ?Vital Signs Assessment: post-procedure vital signs reviewed and stable ?Respiratory status: spontaneous breathing, nonlabored ventilation, respiratory function stable and patient connected to nasal cannula oxygen ?Cardiovascular status: blood pressure returned to baseline and stable ?Postop Assessment: no apparent nausea or vomiting ?Anesthetic complications: no ? ? ?No notable events documented. ? ? ?Last Vitals:  ?Vitals:  ? 02/22/22 1004 02/22/22 1014  ?BP: 109/64 (!) 143/83  ?Pulse:    ?Resp:    ?Temp:    ?SpO2: 100%   ?  ?Last Pain:  ?Vitals:  ? 02/22/22 1014  ?TempSrc:   ?PainSc: 0-No pain  ? ? ?  ?  ?  ?  ?  ?  ? ?Arita Miss ? ? ? ? ?

## 2022-02-22 NOTE — Anesthesia Preprocedure Evaluation (Signed)
Anesthesia Evaluation  ?Patient identified by MRN, date of birth, ID band ?Patient awake ? ? ? ?Reviewed: ?Allergy & Precautions, NPO status , Patient's Chart, lab work & pertinent test results ? ?History of Anesthesia Complications ?Negative for: history of anesthetic complications ? ?Airway ?Mallampati: II ? ?TM Distance: >3 FB ?Neck ROM: Full ? ? ? Dental ?no notable dental hx. ?(+) Teeth Intact ?  ?Pulmonary ?neg pulmonary ROS, neg sleep apnea, neg COPD, Patient abstained from smoking.Not current smoker,  ?  ?Pulmonary exam normal ?breath sounds clear to auscultation ? ? ? ? ? ? Cardiovascular ?Exercise Tolerance: Good ?METS(-) hypertension(-) CAD and (-) Past MI negative cardio ROS ? ?(-) dysrhythmias  ?Rhythm:Regular Rate:Normal ?- Systolic murmurs ? ?  ?Neuro/Psych ?negative neurological ROS ? negative psych ROS  ? GI/Hepatic ?neg GERD  ,(+)  ?  ? (-) substance abuse ? ,   ?Endo/Other  ?neg diabetes ? Renal/GU ?negative Renal ROS  ? ?  ?Musculoskeletal ? ? Abdominal ?  ?Peds ? Hematology ?  ?Anesthesia Other Findings ?Past Medical History: ?No date: Arthritis ?02/23/2008: Basal cell carcinoma ?    Comment:  Left shoulder.  ?06/2020: COVID-19 ?No date: ED (erectile dysfunction) ?01/24/2017: Hx of basal cell carcinoma ?    Comment:  Left lateral canthus. Nodular pattern. Tx: EDC ?No date: Hyperlipidemia ?    Comment:  history of elevated cholesterol, better after pt started ?             exercising. ?No date: Lung nodule ?    Comment:  SEES ALESKEROV ?02/25/2021: Melanoma ([place]) ?    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             03/04/21 LN2, Imiquimod;Mid forehead inf residual MMIS  ?             lentigo maligna type- MOHs completed 11/25/21 ?No date: PONV (postoperative nausea and vomiting) ?    Comment:  40 years ago with hernia surgery. ?No date: Pre-diabetes ? Reproductive/Obstetrics ? ?  ? ? ? ? ? ? ? ? ? ? ? ? ? ?  ?   ? ? ? ? ? ? ? ? ?Anesthesia Physical ?Anesthesia Plan ? ?ASA: 2 ? ?Anesthesia Plan: General  ? ?Post-op Pain Management: Minimal or no pain anticipated  ? ?Induction: Intravenous ? ?PONV Risk Score and Plan: 2 and Propofol infusion, TIVA and Ondansetron ? ?Airway Management Planned: Nasal Cannula ? ?Additional Equipment: None ? ?Intra-op Plan:  ? ?Post-operative Plan:  ? ?Informed Consent: I have reviewed the patients History and Physical, chart, labs and discussed the procedure including the risks, benefits and alternatives for the proposed anesthesia with the patient or authorized representative who has indicated his/her understanding and acceptance.  ? ? ? ?Dental advisory given ? ?Plan Discussed with: CRNA and Surgeon ? ?Anesthesia Plan Comments: (Discussed risks of anesthesia with patient, including possibility of difficulty with spontaneous ventilation under anesthesia necessitating airway intervention, PONV, and rare risks such as cardiac or respiratory or neurological events, and allergic reactions. Discussed the role of CRNA in patient's perioperative care. Patient understands.)  ? ? ? ? ? ? ?Anesthesia Quick Evaluation ? ?

## 2022-02-22 NOTE — Transfer of Care (Signed)
Immediate Anesthesia Transfer of Care Note ? ?Patient: Donald [name] ? ?Procedure(s) Performed: COLONOSCOPY ? ?Patient Location: Endoscopy Unit ? ?Anesthesia Type:General ? ?Level of Consciousness: drowsy ? ?Airway & Oxygen Therapy: Patient Spontanous Breathing ? ?Post-op Assessment: Report given to RN and Post -op Vital signs reviewed and stable ? ?Post vital signs: Reviewed and stable ? ?Last Vitals:  ?Vitals Value Taken Time  ?BP 123/66 02/22/22 0945  ?Temp 35.6 ?C 02/22/22 0944  ?Pulse 72 02/22/22 0945  ?Resp 14 02/22/22 0945  ?SpO2 95 % 02/22/22 0945  ?Vitals shown include unvalidated device data. ? ?Last Pain:  ?Vitals:  ? 02/22/22 0944  ?TempSrc: Temporal  ?   ? ?  ? ?Complications: No notable events documented. ?

## 2022-02-22 NOTE — Interval H&P Note (Signed)
History and Physical Interval Note: Preprocedure H&P from 02/22/22 ? was reviewed and there was no interval change after seeing and examining the patient.  Written consent was obtained from the patient after discussion of risks, benefits, and alternatives. Patient has consented to proceed with Colonoscopy with possible intervention ? ? ?02/22/2022 ?9:00 AM ? ?Donald [name]  has presented today for surgery, with the diagnosis of Hx of adenomatous colonic polyps (Z86.010).  The various methods of treatment have been discussed with the patient and family. After consideration of risks, benefits and other options for treatment, the patient has consented to  Procedure(s): ?COLONOSCOPY (N/A) as a surgical intervention.  The patient's history has been reviewed, patient examined, no change in status, stable for surgery.  I have reviewed the patient's chart and labs.  Questions were answered to the patient's satisfaction.   ? ? ?Donald [name] ? ? ?

## 2022-02-23 ENCOUNTER — Encounter: Payer: Self-pay | Admitting: Gastroenterology

## 2022-02-24 LAB — SURGICAL PATHOLOGY

## 2022-03-04 ENCOUNTER — Encounter: Payer: Medicare Other | Admitting: Dermatology

## 2022-03-25 ENCOUNTER — Ambulatory Visit: Payer: Medicare Other | Admitting: Dermatology

## 2022-03-25 DIAGNOSIS — L820 Inflamed seborrheic keratosis: Secondary | ICD-10-CM | POA: Diagnosis not present

## 2022-03-25 DIAGNOSIS — Z1283 Encounter for screening for malignant neoplasm of skin: Secondary | ICD-10-CM | POA: Diagnosis not present

## 2022-03-25 NOTE — Progress Notes (Signed)
Follow-Up Visit   Subjective  Von Donald [name] is a 71 y.o. male who presents for the following: Annual Exam (Hx MMIS - L forehead with residual tx with Mohs 11/2021 and hx of BCC). The patient presents for Total-Body Skin Exam (TBSE) for skin cancer screening and mole check.  The patient has spots, moles and lesions to be evaluated, some may be new or changing.  The following portions of the chart were reviewed this encounter and updated as appropriate:   Tobacco  Allergies  Meds  Problems  Med Hx  Surg Hx  Fam Hx     Review of Systems:  No other skin or systemic complaints except as noted in HPI or Assessment and Plan.  Objective  Well appearing patient in no apparent distress; mood and affect are within normal limits.  A full examination was performed including scalp, head, eyes, ears, nose, lips, neck, chest, axillae, abdomen, back, buttocks, bilateral upper extremities, bilateral lower extremities, hands, feet, fingers, toes, fingernails, and toenails. All findings within normal limits unless otherwise noted below.  L upper eyelid margin x 1 Erythematous stuck-on, waxy papule or plaque   Assessment & Plan  Inflamed seborrheic keratosis L upper eyelid margin x 1 Symptomatic, irritating, patient would like treated.  Destruction of lesion - L upper eyelid margin x 1 Complexity: simple   Destruction method: cryotherapy   Informed consent: discussed and consent obtained   Timeout:  patient name, date of birth, surgical site, and procedure verified Lesion destroyed using liquid nitrogen: Yes   Region frozen until ice ball extended beyond lesion: Yes   Outcome: patient tolerated procedure well with no complications   Post-procedure details: wound care instructions given    Lentigines - Scattered tan macules - Due to sun exposure - Benign-appearing, observe - Recommend daily broad spectrum sunscreen SPF 30+ to sun-exposed areas, reapply every 2 hours as needed. - Call for  any changes  Seborrheic Keratoses - Stuck-on, waxy, tan-brown papules and/or plaques  - Benign-appearing - Discussed benign etiology and prognosis. - Observe - Call for any changes  Melanocytic Nevi - Tan-brown and/or pink-flesh-colored symmetric macules and papules - Benign appearing on exam today - Observation - Call clinic for new or changing moles - Recommend daily use of broad spectrum spf 30+ sunscreen to sun-exposed areas.   Hemangiomas - Red papules - Discussed benign nature - Observe - Call for any changes  Actinic Damage - Chronic condition, secondary to cumulative UV/sun exposure - diffuse scaly erythematous macules with underlying dyspigmentation - Recommend daily broad spectrum sunscreen SPF 30+ to sun-exposed areas, reapply every 2 hours as needed.  - Staying in the shade or wearing long sleeves, sun glasses (UVA+UVB protection) and wide brim hats (4-inch brim around the entire circumference of the hat) are also recommended for sun protection.  - Call for new or changing lesions.  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  - Call if any new or changing lesions are noted between office visits  History of Basal Cell Carcinoma of the Skin - No evidence of recurrence today - Recommend regular full body skin exams - Recommend daily broad spectrum sunscreen SPF 30+ to sun-exposed areas, reapply every 2 hours  as needed.  - Call if any new or changing lesions are noted between office visits  Acrochordons (Skin Tags) - Fleshy, skin-colored pedunculated papules - Benign appearing.  - Observe. - If desired, they can be removed with an in office procedure  that is not covered by insurance. - Please call the clinic if you notice any new or changing lesions.  Skin cancer screening performed today.  Return in about 4 months (around 07/25/2022) for TBSE - Hx MMIS, BCC.  Luther Redo, CMA, am acting as scribe for [name], MD . Documentation: I have reviewed the above documentation for accuracy and completeness, and I agree with the above.  [name], MD

## 2022-03-25 NOTE — Patient Instructions (Signed)

## 2022-04-04 ENCOUNTER — Encounter: Payer: Self-pay | Admitting: Dermatology

## 2022-08-04 ENCOUNTER — Ambulatory Visit: Payer: Medicare Other | Admitting: Dermatology

## 2022-08-18 NOTE — Progress Notes (Unsigned)
Referring Physician:  Baxter Hire, MD [place],  [place] [address]  Primary Physician:  Baxter Hire, MD  History of Present Illness: 08/18/2022 Mr. Donald [name] has seen Andee Poles back on 02/11/22 for a phone visit to review his lumbar MRI. He has known diffuse lumbar spondylosis with significant stenosis at L3-L4 as well as moderate bilateral facet arthropathy at L3-L4 and L4-L5.   He was improving with PT and wanted to consider injections. He was referred to PMR and had injections (see below).   He was referred here by podiatry for right LE weakness. He had right deep peroneal nerve steroid injection by podiatry on 08/09/22 with improvement in burning foot pain.      Duration: *** Location: *** Quality: *** Severity: ***  Precipitating: aggravated by *** Modifying factors: made better by *** Weakness: none Timing: *** Bowel/Bladder Dysfunction: none  Conservative measures:  Physical therapy: ***  Multimodal medical therapy including regular antiinflammatories: ***  Injections:  Left L4-L5 TF ESI 03/09/22 ([facility]) Left L4-L5 TF ESI 05/03/22 ([facility]) Left L4-L5 TF ESI 08/06/22 ([facility])  Past Surgery: ***  Docia Chuck has ***no symptoms of cervical myelopathy.  The symptoms are causing a significant impact on the patient's life.   Review of Systems:  A 10 point review of systems is negative, except for the pertinent positives and negatives detailed in the HPI.  Past Medical History: Past Medical History:  Diagnosis Date   Arthritis    Basal cell carcinoma 02/23/2008   Left shoulder.    COVID-19 06/2020   ED (erectile dysfunction)    Hx of basal cell carcinoma 01/24/2017   Left lateral canthus. Nodular pattern. Tx: EDC   Hyperlipidemia    history of elevated cholesterol, better after pt started exercising.   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   Pre-diabetes     Past Surgical History: Past Surgical History:  Procedure Laterality Date   BACK SURGERY  1984   L4-5   CATARACT EXTRACTION W/PHACO Right 06/03/2015   Procedure: CATARACT EXTRACTION PHACO AND INTRAOCULAR LENS PLACEMENT (IOC);  Surgeon: Birder Robson, MD;  Location: [facility] ORS;  Service: Ophthalmology;  Laterality: Right;  [place]: 01:26.5AP%: 19.7CDE: 17.07Fluid lot# [id number]   COLONOSCOPY N/A 08/16/2016   Procedure: COLONOSCOPY;  Surgeon: Lollie Sails, MD;  Location: [facility] ENDOSCOPY;  Service: Endoscopy;  Laterality: N/A;   COLONOSCOPY N/A 02/22/2022   Procedure: COLONOSCOPY;  Surgeon: Annamaria Helling, DO;  Location: [facility] ENDOSCOPY;  Service: Gastroenterology;  Laterality: N/A;   CYSTOSCOPY WITH INSERTION OF UROLIFT N/A 12/11/2020   Procedure: CYSTOSCOPY WITH INSERTION OF UROLIFT;  Surgeon: Royston Cowper, MD;  Location: [facility] ORS;  Service: Urology;  Laterality: N/A;   EYE SURGERY     FRACTURE SURGERY Right 1976   ankle   HERNIA REPAIR Left 1972   nausea and vomiting with general anesthesia   JOINT REPLACEMENT Left 2012   JOINT REPLACEMENT Right 2014   knees replaced     TOTAL HIP ARTHROPLASTY Right 12/08/2016   Procedure: TOTAL HIP ARTHROPLASTY;  Surgeon: Dereck Leep, MD;  Location: [facility] ORS;  Service: Orthopedics;  Laterality: Right;    Allergies: Allergies as of 08/19/2022   (No Known Allergies)  Medications: Outpatient Encounter Medications as of 08/19/2022  Medication Sig   aspirin 325 MG tablet Take 325 mg by mouth daily.   ciprofloxacin (CIPRO) 500 MG tablet Take 1 tablet (500 mg total) by mouth 2 (two) times daily. (Patient not taking: Reported on 09/21/2021)   fluticasone (FLONASE) 50 MCG/ACT nasal spray Place 1 spray into both nostrils daily as needed for allergies.    imiquimod (ALDARA) 5 % cream Apply topically at bedtime. Qhs aa left forehead for 4 weeks (Patient not taking: Reported on 09/21/2021)   ketoconazole (NIZORAL) 2 % cream Apply twice daily to feet until 1 week post clearance (Patient not taking: Reported on 09/21/2021)   Meth-Hyo-M Bl-Na Phos-Ph Sal (URIBEL) 118 MG CAPS Take 1 capsule (118 mg total) by mouth every 6 (six) hours as needed (dysuria). (Patient not taking: Reported on 09/21/2021)   metoprolol tartrate (LOPRESSOR) 100 MG tablet Take 1 tablet (100 mg total) by mouth once for 1 dose. Please take one time dose '100mg'$  metoprolol tartrate 2 hr prior to cardiac CT for HR control IF HR >55bpm.   mometasone (ELOCON) 0.1 % cream Apply 1 application topically daily as needed (Rash). (Patient not taking: Reported on 09/21/2021)   Multiple Vitamin (MULTIVITAMIN WITH MINERALS) TABS tablet Take 1 tablet by mouth daily.   Polyethyl Glycol-Propyl Glycol (SYSTANE OP) Place 1 drop into both eyes daily as needed (dry eyes).   sildenafil (REVATIO) 20 MG tablet Take 20 mg by mouth daily.    Testosterone (AXIRON TD) Place 1 application onto the skin daily. Testosterone 200 mg cream   TURMERIC CURCUMIN PO Take 1 tablet by mouth daily. With coq 10   Zinc 30 MG TABS Take 30 mg by mouth daily.   No facility-administered encounter medications on file as of 08/19/2022.    Social History: Social History   Tobacco Use   Smoking status: Never   Smokeless tobacco: Current    Types: Chew  Vaping Use   Vaping Use: Never used  Substance Use Topics   Alcohol use: Yes    Alcohol/week: 3.0 - 5.0 standard drinks of alcohol    Types: 1 - 2 Glasses of wine, 2 - 3 Cans of beer per week    Comment: OCC   Drug use: No    Comment: DELTA 8 OCC    Family Medical History: No family history on file.  Physical Examination: There were no vitals filed for this visit.  General: Patient is well developed, well nourished, calm, collected, and in no apparent distress.  Attention to examination is appropriate.  Respiratory: Patient is breathing without any difficulty.   NEUROLOGICAL:     Awake, alert, oriented to person, place, and time.  Speech is clear and fluent. Fund of knowledge is appropriate.   Cranial Nerves: Pupils equal round and reactive to light.  Facial tone is symmetric.  Facial sensation is symmetric.  ROM of spine:  *** ROM of cervical spine *** pain *** ROM of lumbar spine *** pain  No abnormal lesions on exposed skin.   Strength: Side Biceps Triceps Deltoid Interossei Grip Wrist Ext. Wrist Flex.  R '5 5 5 5 5 5 5  '$ L '5 5 5 5 5 5 5   '$ Side Iliopsoas Quads Hamstring PF DF EHL  R '5 5 5 5 5 5  '$ L '5 5 5 5 5 5   '$ Reflexes are ***2+ and symmetric at the biceps, triceps, brachioradialis, patella and achilles.   Hoffman's is absent.  Clonus is not  present.   Bilateral upper and lower extremity sensation is intact to light touch.    No evidence of dysmetria noted.  Gait is normal.   ***No difficulty with tandem gait.    Medical Decision Making  Imaging: Lumbar spine MRI 12/31/21:  FINDINGS: Segmentation:  Standard.   Alignment:  Physiologic.   Vertebrae: No acute fracture, evidence of discitis, or aggressive bone lesion.   Conus medullaris and cauda equina: Conus extends to the T12-L1 level. Conus and cauda equina appear normal.   Paraspinal and other soft tissues: No acute paraspinal abnormality.   Disc levels:   Disc spaces: Disc desiccation throughout the lumbar spine. Disc height loss at L2-3, L3-4, L4-5 and L5-S1.   T12-L1: Minimal broad-based disc bulge. Mild bilateral facet arthropathy. No foraminal or central canal stenosis.   L1-L2: Mild broad-based disc bulge with a central annular fissure. Mild bilateral facet arthropathy. Mild spinal stenosis. Mild bilateral foraminal stenosis.   L2-L3: Broad-based disc bulge. Mild bilateral facet arthropathy. Mild spinal stenosis. No foraminal stenosis.   L3-L4:  Broad-based disc bulge with a small central disc protrusion. Moderate bilateral facet arthropathy. Severe spinal stenosis. No foraminal stenosis. Bilateral subarticular recess stenosis.   L4-L5: Broad-based disc bulge. Right laminectomy. Moderate bilateral facet arthropathy. Mild left foraminal stenosis. No right foraminal stenosis. No spinal stenosis. Bilateral subarticular recess stenosis.   L5-S1: Broad-based disc bulge with a central annular fissure. Mild bilateral facet arthropathy. No foraminal or central canal stenosis.   IMPRESSION: 1. Diffuse lumbar spine spondylosis as described above. 2. No acute osseous injury of the lumbar spine.     Electronically Signed   By: Kathreen Devoid M.D.   On: 12/31/2021 14:37  I have personally reviewed the images and agree with the above interpretation.  Assessment and Plan: Mr. Rodelo is a pleasant 71 y.o. male with ***  Above treatment options discussed with patient and following plan made:   - Order for physical therapy for *** spine ***. - Continue on current medications including ***. Reviewed proper dosing along with risks and benefits. Take and NSAIDs with food.      I spent a total of *** minutes in face-to-face and non-face-to-face activities related to this patient's care toda including review of outside records, review of imaging, review of symptoms, physical exam, discussion of differential diagnosis, discussion of treatment options, and documentation.   Thank you for involving me in the care of this patient.   Geronimo Boot PA-C Dept. of Neurosurgery

## 2022-08-19 ENCOUNTER — Ambulatory Visit (INDEPENDENT_AMBULATORY_CARE_PROVIDER_SITE_OTHER): Payer: Medicare Other | Admitting: Orthopedic Surgery

## 2022-08-19 ENCOUNTER — Encounter: Payer: Self-pay | Admitting: Orthopedic Surgery

## 2022-08-19 VITALS — BP 161/69 | HR 80 | Ht 72.0 in | Wt 262.4 lb

## 2022-08-19 DIAGNOSIS — R29898 Other symptoms and signs involving the musculoskeletal system: Secondary | ICD-10-CM | POA: Diagnosis not present

## 2022-08-19 DIAGNOSIS — M48061 Spinal stenosis, lumbar region without neurogenic claudication: Secondary | ICD-10-CM

## 2022-08-19 DIAGNOSIS — M47816 Spondylosis without myelopathy or radiculopathy, lumbar region: Secondary | ICD-10-CM

## 2022-08-19 NOTE — Patient Instructions (Signed)
It was so nice to see you today.   I want to get an updated lumbar MRI to look into right foot weakness. We will work on getting this approved and [facility] will call you to schedule.   Let me know if you decide you want to get a brace for your foot to prevent tripping.   I want to get an EMG (nerve conduction test) to look into right foot weakness. further. We will call you to set this up.   I will put your MRI results on MyChart and will contact you after I get your EMG results.   Please do not hesitate to call if you have any questions or concerns. You can also message me in [place].   If you have not heard back about any of the tests/procedures in the next week, please call the office so we can help you get these things scheduled.   Geronimo Boot PA-C [phone]

## 2022-08-24 ENCOUNTER — Ambulatory Visit
Admission: RE | Admit: 2022-08-24 | Discharge: 2022-08-24 | Disposition: A | Discharge status: Home / Self Care | Payer: Medicare Other | Source: Ambulatory Visit | Attending: Orthopedic Surgery | Admitting: Orthopedic Surgery

## 2022-08-24 DIAGNOSIS — M48061 Spinal stenosis, lumbar region without neurogenic claudication: Secondary | ICD-10-CM | POA: Diagnosis present

## 2022-08-24 DIAGNOSIS — M47816 Spondylosis without myelopathy or radiculopathy, lumbar region: Secondary | ICD-10-CM | POA: Insufficient documentation

## 2022-08-24 DIAGNOSIS — R29898 Other symptoms and signs involving the musculoskeletal system: Secondary | ICD-10-CM | POA: Insufficient documentation

## 2022-08-30 ENCOUNTER — Encounter: Payer: Self-pay | Admitting: Orthopedic Surgery

## 2022-08-30 NOTE — Telephone Encounter (Signed)
FINDINGS: Segmentation:  5 lumbar type vertebrae   Alignment:  Physiologic.   Vertebrae:  No fracture, evidence of discitis, or bone lesion.   Conus medullaris and cauda equina: Conus extends to the L1 level. Conus and cauda equina appear normal.   Paraspinal and other soft tissues: Negative for perispinal mass or inflammation. Fatty atrophy of lower right intrinsic back muscles which is likely postoperative.   Disc levels:   T12- L1: Mild spondylosis and facet spurring   L1-L2: Disc narrowing and bulging with left paracentral protrusion. Moderate spinal stenosis that is mildly progressed. Mild facet spurring and ligamentum flavum thickening   L2-L3: Spondylosis with disc narrowing and circumferential bulge. Mild facet spurring. Mild to moderate spinal stenosis that is stable   L3-L4: Disc narrowing and endplate degeneration with bulge, ridging, and right paracentral protrusion. Degenerative facet spurring and ligamentum flavum thickening. Moderate, borderline advanced spinal stenosis. Moderate right foraminal narrowing   L4-L5: Disc narrowing and bulging with endplate ridging. Prior right canal and subarticular recess decompression. Mild spinal stenosis. No nerve root compression. Both foramina are patent   L5-S1:Disc narrowing and bulging with circumferential ridging. Mild facet spurring greater on the right. No neural compression.   IMPRESSION: 1. No change since earlier this year to explain symptoms. No right L5 impingement. 2. Generalized lumbar spine degeneration with spinal stenosis that is moderate at L1-2 and moderate to advanced at L3-4.     Electronically Signed   By: Jorje Guild M.D.   On: 08/27/2022 05:15  I have personally reviewed the images and agree with the above interpretation.   Above imaging reviewed with Dr. Izora Ribas. This would not explain right foot weakness/foot drop.   Per PMR notes, he is scheduled for EMG this week. Will see what  that shows.

## 2022-08-31 NOTE — Telephone Encounter (Signed)
Donald [name],   You can add him on Tuesday 09/07/22 at 3:00 with me, but will need to make sure we have EMG results by that time. Please put that in appt.   Thanks!

## 2022-09-06 NOTE — Telephone Encounter (Signed)
Reviewed with Dr. Izora Ribas. Recommend he see Dr. Costella Hatcher at [facility]. Will discuss with patient and his appointment tomorrow.

## 2022-09-06 NOTE — Progress Notes (Deleted)
Referring Physician:  Baxter Hire, MD [place],  [place] [address]  Primary Physician:  Baxter Hire, MD  History of Present Illness:  Last seen by me on 08/19/22 with 3 weeks of weakness in right foot. He was working in yard and felt pulling in his right foot. He started with burning on the top of the foot. This improved, but then he noticed his foot "slapping" the floor when he walks and he has tripped on his toes a few times. Limited ROM of right ankle due to fracture years ago.   He has known diffuse lumbar spondylosis with significant stenosis at L3-L4 as well as moderate bilateral facet arthropathy at L3-L4 and L4-L5.    History of lumbar surgery years ago, ?lumbar decompression.   He had a lumbar MRI and EMG done. He is here to review the results. He is wearing his AFO.        Mr. Coltin Casher has seen Danielle back on 02/11/22 for a phone visit to review his lumbar MRI. He has known diffuse lumbar spondylosis with significant stenosis at L3-L4 as well as moderate bilateral facet arthropathy at L3-L4 and L4-L5.   He was improving with PT and wanted to consider injections. He was referred to PMR and had injections (see below) with improvement.   He was referred here by podiatry for right LE weakness that started 3 weeks ago. He had right deep peroneal nerve steroid injection by podiatry on 08/09/22- no change in weakness per patient.   He was working in yard and felt pulling in his right foot about 3 weeks ago. He started with burning on the top of the foot. This improved, but then he noticed his foot "slapping" the floor when he walks and he has tripped on his toes a few times. Limited ROM of right ankle due to fracture years ago.   No pain in right leg. No numbness or tingling.   He has intermittent left sided LBP and left posterior leg pain to his knee. Some improvement with ESIs, but is still there. No bowel or bladder issues.   Conservative  measures:  Physical therapy: dry needling and acupuncture with chiropractor. Did 2 visits PT in March 2023.  Multimodal medical therapy including regular antiinflammatories: aleve  Injections:  Left L4-L5 TF ESI 03/09/22 ([facility]) Left L4-L5 TF ESI 05/03/22 ([facility]) Left L4-L5 TF ESI 08/06/22 ([facility])  Past Surgery: ? Decompressive lumbar surgery in 1984 with Dr. Atilano Ina has no symptoms of cervical myelopathy.  The symptoms are causing a significant impact on the patient's life.   Review of Systems:  A 10 point review of systems is negative, except for the pertinent positives and negatives detailed in the HPI.  Past Medical History: Past Medical History:  Diagnosis Date   Arthritis    Basal cell carcinoma 02/23/2008   Left shoulder.    COVID-19 06/2020   ED (erectile dysfunction)    Hx of basal cell carcinoma 01/24/2017   Left lateral canthus. Nodular pattern. Tx: EDC   Hyperlipidemia    history of elevated cholesterol, better after pt started exercising.   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   Pre-diabetes  Past Surgical History: Past Surgical History:  Procedure Laterality Date   BACK SURGERY  1984   L4-5   CATARACT EXTRACTION W/PHACO Right 06/03/2015   Procedure: CATARACT EXTRACTION PHACO AND INTRAOCULAR LENS PLACEMENT (IOC);  Surgeon: Birder Robson, MD;  Location: [facility] ORS;  Service: Ophthalmology;  Laterality: Right;  [place]: 01:26.5AP%: 19.7CDE: 17.07Fluid lot# [id number]   COLONOSCOPY N/A 08/16/2016   Procedure: COLONOSCOPY;  Surgeon: Lollie Sails, MD;  Location: [facility] ENDOSCOPY;  Service: Endoscopy;  Laterality: N/A;   COLONOSCOPY N/A 02/22/2022   Procedure: COLONOSCOPY;  Surgeon: Annamaria Helling, DO;  Location[facility] ENDOSCOPY;  Service: Gastroenterology;  Laterality: N/A;   CYSTOSCOPY WITH INSERTION OF UROLIFT N/A 12/11/2020   Procedure: CYSTOSCOPY WITH INSERTION OF UROLIFT;  Surgeon: Royston Cowper, MD;  Location: [facility] ORS;  Service: Urology;  Laterality: N/A;   EYE SURGERY     FRACTURE SURGERY Right 1976   ankle   HERNIA REPAIR Left 1972   nausea and vomiting with general anesthesia   JOINT REPLACEMENT Left 2012   JOINT REPLACEMENT Right 2014   knees replaced     TOTAL HIP ARTHROPLASTY Right 12/08/2016   Procedure: TOTAL HIP ARTHROPLASTY;  Surgeon: Dereck Leep, MD;  Location: [facility] ORS;  Service: Orthopedics;  Laterality: Right;    Allergies: Allergies as of 09/07/2022   (No Known Allergies)    Medications: Outpatient Encounter Medications as of 09/07/2022  Medication Sig   aspirin 325 MG tablet Take 325 mg by mouth daily.   fluticasone (FLONASE) 50 MCG/ACT nasal spray Place 1 spray into both nostrils daily as needed for allergies.   Multiple Vitamin (MULTIVITAMIN WITH MINERALS) TABS tablet Take 1 tablet by mouth daily.   Polyethyl Glycol-Propyl Glycol (SYSTANE OP) Place 1 drop into both eyes daily as needed (dry eyes).   sildenafil (REVATIO) 20 MG tablet Take 20 mg by mouth daily.    Testosterone (AXIRON TD) Place 1 application onto the skin daily. Testosterone 200 mg cream   TURMERIC CURCUMIN PO Take 1 tablet by mouth daily. With coq 10   Zinc 30 MG TABS Take 30 mg by mouth daily.   No facility-administered encounter medications on file as of 09/07/2022.    Social History: Social History   Tobacco Use   Smoking status: Never   Smokeless tobacco: Former    Types: Chew    Quit date: 03/2022  Vaping Use   Vaping Use: Never used  Substance Use Topics   Alcohol use: Yes    Alcohol/week: 3.0 - 5.0 standard drinks of alcohol    Types: 1 - 2 Glasses of wine, 2 - 3 Cans of beer per week    Comment: OCC   Drug use: No    Comment: DELTA 8 OCC     Family Medical History: No family history on file.  Physical Examination: There were no vitals filed for this visit.  General: Patient is well developed, well nourished, calm, collected, and in no apparent distress. Attention to examination is appropriate.  Respiratory: Patient is breathing without any difficulty.   NEUROLOGICAL:     Awake, alert, oriented to person, place, and time.  Speech is clear and fluent. Fund of knowledge is appropriate.   Cranial Nerves: Pupils equal round and reactive to light.  Facial tone is symmetric.  Facial sensation is symmetric.  Strength: Side Biceps Triceps Deltoid Interossei Grip Wrist Ext. Wrist Flex.  R '5 5 5 5 5 5 5  '$ L '5 5 5 5 5 5 '$ 5  Side Iliopsoas Quads Hamstring PF DF EHL  R '5 5 5 5 4 4  '$ L '5 5 5 5 5 5   '$ Reflexes are 1+ and symmetric at the biceps, triceps, brachioradialis, patella and achilles.   Hoffman's is absent.  Clonus is not present.   Bilateral upper and lower extremity sensation is intact to light touch.     He has limited ROM of right ankle.   Gait is normal. No foot slapping right foot.   Medical Decision Making  Imaging: Lumbar spine MRI dated  FINDINGS: Segmentation:  5 lumbar type vertebrae   Alignment:  Physiologic.   Vertebrae:  No fracture, evidence of discitis, or bone lesion.   Conus medullaris and cauda equina: Conus extends to the L1 level. Conus and cauda equina appear normal.   Paraspinal and other soft tissues: Negative for perispinal mass or inflammation. Fatty atrophy of lower right intrinsic back muscles which is likely postoperative.   Disc levels:   T12- L1: Mild spondylosis and facet spurring   L1-L2: Disc narrowing and bulging with left paracentral protrusion. Moderate spinal stenosis that is mildly progressed. Mild facet spurring and ligamentum flavum thickening   L2-L3: Spondylosis with disc narrowing and circumferential bulge. Mild facet spurring. Mild to moderate spinal  stenosis that is stable   L3-L4: Disc narrowing and endplate degeneration with bulge, ridging, and right paracentral protrusion. Degenerative facet spurring and ligamentum flavum thickening. Moderate, borderline advanced spinal stenosis. Moderate right foraminal narrowing   L4-L5: Disc narrowing and bulging with endplate ridging. Prior right canal and subarticular recess decompression. Mild spinal stenosis. No nerve root compression. Both foramina are patent   L5-S1:Disc narrowing and bulging with circumferential ridging. Mild facet spurring greater on the right. No neural compression.   IMPRESSION: 1. No change since earlier this year to explain symptoms. No right L5 impingement. 2. Generalized lumbar spine degeneration with spinal stenosis that is moderate at L1-2 and moderate to advanced at L3-4.     Electronically Signed   By: Jorje Guild M.D.   On: 08/27/2022 05:15  I have personally reviewed the images and agree with the above interpretation.  EMG of right LE dated 09/02/22 (under media) shows:  ***  Assessment and Plan: Mr. Rasheed is a pleasant 71 y.o. male with intermittent left sided LBP and left posterior leg pain to his knee. He has known diffuse lumbar spondylosis with significant stenosis at L3-L4 as well as moderate bilateral facet arthropathy at L3-L4 and L4-L5.   History of lumbar surgery years ago, ?lumbar decompression.   Primary complaint today is 3 weeks of weakness in right foot. He was working in yard and felt pulling in his right foot. He started with burning on the top of the foot. This improved, but then he noticed his foot "slapping" the floor when he walks and he has tripped on his toes a few times. Limited ROM of right ankle due to fracture years ago. No pain in right leg. No numbness or tingling.   Do not see cause of weakness on lumbar MRI from 12/31/21.   Treatment options discussed with patient and following plan made:   - MRI of lumbar spine  to evaluate right foot weakness that is new.  - EMG/NCS of bilateral lower extremities to evaluate new right foot weakness.  - Offered to get him AFO brace. He declined.  - Will put MRI results on MyChart for him and will plan to see him back to review EMG results to check  a repeat exam.   Of note, BP was 161/69. No symptoms. He checks BP at home and it generally is in the 130s/60-70s. He is to call PCP with any issues.   I spent a total of 30 minutes in face-to-face and non-face-to-face activities related to this patient's care toda including review of outside records, review of imaging, review of symptoms, physical exam, discussion of differential diagnosis, discussion of treatment options, and documentation.   Thank you for involving me in the care of this patient.   Geronimo Boot PA-C Dept. of Neurosurgery

## 2022-09-07 ENCOUNTER — Ambulatory Visit: Payer: Medicare Other | Admitting: Orthopedic Surgery

## 2022-09-07 NOTE — Telephone Encounter (Signed)
Below note should state reviewed EMG with Dr. Izora Ribas.

## 2022-09-27 NOTE — Progress Notes (Unsigned)
Referring Physician:  Baxter Hire, MD [place],  [place] [address]  Primary Physician:  Baxter Hire, MD  History of Present Illness:  Last seen by me on 08/19/22 with 3 weeks of weakness in right foot. He was working in yard and felt pulling in his right foot. He started with burning on the top of the foot. This improved, but then he noticed his foot "slapping" the floor when he walks and he has tripped on his toes a few times. Limited ROM of right ankle due to fracture years ago.   He has known diffuse lumbar spondylosis with significant stenosis at L3-L4 as well as moderate bilateral facet arthropathy at L3-L4 and L4-L5.    History of lumbar surgery years ago, ?lumbar decompression.   He had a lumbar MRI and EMG done. He is here to review the results. He is wearing his AFO.   He has been doing dry needling with chiropractor for last 5 weeks, he thinks it is helping some. He has intermittent left sided LBP with intermittent left posterior leg pain to his knee. Pain in left leg is his primary complaint. He notes weakness in left and feels like left leg will give way.   No relief with a few visits of PT.   Conservative measures:  Physical therapy: dry needling and acupuncture with chiropractor. Did 2 visits PT in March 2023.  Multimodal medical therapy including regular antiinflammatories: aleve  Injections:  Left L4-L5 TF ESI 08/06/22 ([facility]) Left L4-L5 TF ESI 03/09/22 ([facility]) Left L4-L5 TF ESI 05/03/22 ([facility])  Past Surgery: ? Decompressive lumbar surgery in 1984 with Dr. Atilano Ina has no symptoms of cervical myelopathy.  The symptoms are causing a significant impact on the patient's life.   Review of Systems:  A 10 point review of systems is negative, except for the pertinent positives and negatives detailed in the HPI.  Past Medical History: Past Medical History:  Diagnosis Date   Arthritis    Basal cell carcinoma  02/23/2008   Left shoulder.    COVID-19 06/2020   ED (erectile dysfunction)    Hx of basal cell carcinoma 01/24/2017   Left lateral canthus. Nodular pattern. Tx: EDC   Hyperlipidemia    history of elevated cholesterol, better after pt started exercising.   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   Pre-diabetes     Past Surgical History: Past Surgical History:  Procedure Laterality Date   BACK SURGERY  1984   L4-5   CATARACT EXTRACTION W/PHACO Right 06/03/2015   Procedure: CATARACT EXTRACTION PHACO AND INTRAOCULAR LENS PLACEMENT (IOC);  Surgeon: Birder Robson, MD;  Location: [facility] ORS;  Service: Ophthalmology;  Laterality: Right;  [place]: 01:26.5AP%: 19.7CDE: 17.07Fluid lot# [id number]   COLONOSCOPY N/A 08/16/2016   Procedure: COLONOSCOPY;  Surgeon: Lollie Sails, MD;  Location: [facility] ENDOSCOPY;  Service: Endoscopy;  Laterality: N/A;   COLONOSCOPY N/A 02/22/2022   Procedure: COLONOSCOPY;  Surgeon: Annamaria Helling, DO;  Location[facility] ENDOSCOPY;  Service: Gastroenterology;  Laterality: N/A;   CYSTOSCOPY WITH INSERTION OF UROLIFT N/A 12/11/2020   Procedure: CYSTOSCOPY WITH INSERTION OF UROLIFT;  Surgeon: Royston Cowper, MD;  Location: [facility] ORS;  Service: Urology;  Laterality: N/A;   EYE SURGERY     FRACTURE SURGERY Right 1976   ankle   HERNIA REPAIR Left 1972   nausea and vomiting with general anesthesia   JOINT REPLACEMENT Left 2012   JOINT REPLACEMENT Right 2014   knees replaced     TOTAL HIP ARTHROPLASTY Right 12/08/2016   Procedure: TOTAL HIP ARTHROPLASTY;  Surgeon: Dereck Leep, MD;  Location: [facility] ORS;  Service: Orthopedics;  Laterality: Right;    Allergies: Allergies as of 09/30/2022   (No Known Allergies)     Medications: Outpatient Encounter Medications as of 09/30/2022  Medication Sig   aspirin 325 MG tablet Take 325 mg by mouth daily.   fluticasone (FLONASE) 50 MCG/ACT nasal spray Place 1 spray into both nostrils daily as needed for allergies.   Multiple Vitamin (MULTIVITAMIN WITH MINERALS) TABS tablet Take 1 tablet by mouth daily.   Polyethyl Glycol-Propyl Glycol (SYSTANE OP) Place 1 drop into both eyes daily as needed (dry eyes).   sildenafil (REVATIO) 20 MG tablet Take 20 mg by mouth daily.    Testosterone (AXIRON TD) Place 1 application onto the skin daily. Testosterone 200 mg cream   TURMERIC CURCUMIN PO Take 1 tablet by mouth daily. With coq 10   Zinc 30 MG TABS Take 30 mg by mouth daily.   No facility-administered encounter medications on file as of 09/30/2022.    Social History: Social History   Tobacco Use   Smoking status: Never   Smokeless tobacco: Former    Types: Chew    Quit date: 03/2022  Vaping Use   Vaping Use: Never used  Substance Use Topics   Alcohol use: Yes    Alcohol/week: 3.0 - 5.0 standard drinks of alcohol    Types: 1 - 2 Glasses of wine, 2 - 3 Cans of beer per week    Comment: OCC   Drug use: No    Comment: DELTA 8 OCC    Family Medical History: No family history on file.  Physical Examination: There were no vitals filed for this visit.  General: Patient is well developed, well nourished, calm, collected, and in no apparent distress. Attention to examination is appropriate.  Respiratory: Patient is breathing without any difficulty.   NEUROLOGICAL:     Awake, alert, oriented to person, place, and time.  Speech is clear and fluent. Fund of knowledge is appropriate.   Cranial Nerves: Pupils equal round and reactive to light.  Facial tone is symmetric.    Strength:  Side Iliopsoas Quads Hamstring PF DF EHL  R '5 5 5 5 4 4  '$ L '5 5 5 5 5 5   '$ Bilateral lower extremity sensation is intact to light touch.     He has limited ROM of right  ankle.   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  Absent right sural sensory nerve conduction study. This can me a normal finding in age > 36 years old.  No electrodiagnostic evidence of common peroneal motor entrapment at the level of the right fibular head.  No electrodiagnostic evidence of a lumbosacral radiculopathy in the right lower extremity.  No electrodiagnostic evidence of a myopathy in right lower extremity.    Assessment and Plan: Mr. Donald [name] is a pleasant 71 y.o. male with weakness in right foot, but his primary complaint is intermittent  left sided LBP with intermittent left posterior leg pain to his knee. He notes weakness in left and feels like left leg will give way.   History of lumbar surgery years ago, ?lumbar decompression.   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   He has known diffuse lumbar spondylosis with significant stenosis at L3-L4 as well as moderate bilateral facet arthropathy at L3-L4 and L4-L5.   Treatment options discussed with patient and following plan made:   - Referral to Dr. Tyler Pita for evaluation of right foot drop. Message sent to him.  - PT for lumbar spine at [facility]. Patient given number to call.  - Follow up with Dr. Izora Ribas in 6 weeks to discuss any possible surgery options for lumbar spine (LBP and left leg pain).   I spent a total of 20 minutes in face-to-face and non-face-to-face activities related to this patient's care toda including review of outside records, review of imaging, review of symptoms, physical exam, discussion of differential diagnosis, discussion of  treatment options, and documentation.   Geronimo Boot PA-C Dept. of Neurosurgery

## 2022-09-30 ENCOUNTER — Encounter: Payer: Self-pay | Admitting: Orthopedic Surgery

## 2022-09-30 ENCOUNTER — Ambulatory Visit: Payer: Medicare Other | Admitting: Orthopedic Surgery

## 2022-09-30 VITALS — BP 140/70 | Ht 72.0 in | Wt 262.2 lb

## 2022-09-30 DIAGNOSIS — M47816 Spondylosis without myelopathy or radiculopathy, lumbar region: Secondary | ICD-10-CM | POA: Diagnosis not present

## 2022-09-30 DIAGNOSIS — R29898 Other symptoms and signs involving the musculoskeletal system: Secondary | ICD-10-CM | POA: Diagnosis not present

## 2022-09-30 DIAGNOSIS — M48061 Spinal stenosis, lumbar region without neurogenic claudication: Secondary | ICD-10-CM | POA: Diagnosis not present

## 2022-09-30 DIAGNOSIS — M21371 Foot drop, right foot: Secondary | ICD-10-CM

## 2022-09-30 NOTE — Patient Instructions (Signed)
It was so nice to see you today.   I want you to see Dr. Tyler Pita at [facility] for your right foot drop. They should call you to schedule.   I sent PT orders to [facility] for your back. You need to call them to schedule [phone].   I want you to follow up with Dr. Izora Ribas in 6 weeks to discuss any possible surgery options.   Please do not hesitate to call if you have any questions or concerns. You can also message me in [place].   If you have not heard back about any of the tests/procedures in the next week, please call the office so we can help you get these things scheduled.   Geronimo Boot PA-C [phone]

## 2022-10-14 ENCOUNTER — Ambulatory Visit: Payer: Medicare Other | Admitting: Dermatology

## 2022-10-14 ENCOUNTER — Encounter: Payer: Self-pay | Admitting: Dermatology

## 2022-10-14 DIAGNOSIS — Z1283 Encounter for screening for malignant neoplasm of skin: Secondary | ICD-10-CM

## 2022-10-14 DIAGNOSIS — L820 Inflamed seborrheic keratosis: Secondary | ICD-10-CM | POA: Diagnosis not present

## 2022-10-14 DIAGNOSIS — D229 Melanocytic nevi, unspecified: Secondary | ICD-10-CM

## 2022-10-14 DIAGNOSIS — Z85828 Personal history of other malignant neoplasm of skin: Secondary | ICD-10-CM

## 2022-10-14 DIAGNOSIS — B079 Viral wart, unspecified: Secondary | ICD-10-CM

## 2022-10-14 DIAGNOSIS — Z85820 Personal history of malignant melanoma of skin: Secondary | ICD-10-CM

## 2022-10-14 DIAGNOSIS — L280 Lichen simplex chronicus: Secondary | ICD-10-CM | POA: Diagnosis not present

## 2022-10-14 DIAGNOSIS — L821 Other seborrheic keratosis: Secondary | ICD-10-CM

## 2022-10-14 DIAGNOSIS — Z86006 Personal history of melanoma in-situ: Secondary | ICD-10-CM | POA: Diagnosis not present

## 2022-10-14 DIAGNOSIS — L814 Other melanin hyperpigmentation: Secondary | ICD-10-CM

## 2022-10-14 DIAGNOSIS — L578 Other skin changes due to chronic exposure to nonionizing radiation: Secondary | ICD-10-CM

## 2022-10-14 NOTE — Patient Instructions (Signed)
Cryotherapy Aftercare  Wash gently with soap and water everyday.   Apply Vaseline and Band-Aid daily until healed.   Melanoma ABCDEs  Melanoma is the most dangerous type of skin cancer, and is the leading cause of death from skin disease.  You are more likely to develop melanoma if you: Have light-colored skin, light-colored eyes, or red or blond hair Spend a lot of time in the sun Tan regularly, either outdoors or in a tanning bed Have had blistering sunburns, especially during childhood Have a close family member who has had a melanoma Have atypical moles or large birthmarks  Early detection of melanoma is key since treatment is typically straightforward and cure rates are extremely high if we catch it early.   The first sign of melanoma is often a change in a mole or a new dark spot.  The ABCDE system is a way of remembering the signs of melanoma.  A for asymmetry:  The two halves do not match. B for border:  The edges of the growth are irregular. C for color:  A mixture of colors are present instead of an even brown color. D for diameter:  Melanomas are usually (but not always) greater than 6mm - the size of a pencil eraser. E for evolution:  The spot keeps changing in size, shape, and color.  Please check your skin once per month between visits. You can use a small mirror in front and a large mirror behind you to keep an eye on the back side or your body.   If you see any new or changing lesions before your next follow-up, please call to schedule a visit.  Please continue daily skin protection including broad spectrum sunscreen SPF 30+ to sun-exposed areas, reapplying every 2 hours as needed when you're outdoors.     Due to recent changes in healthcare laws, you may see results of your pathology and/or laboratory studies on MyChart before the doctors have had a chance to review them. We understand that in some cases there may be results that are confusing or concerning to you.  Please understand that not all results are received at the same time and often the doctors may need to interpret multiple results in order to provide you with the best plan of care or course of treatment. Therefore, we ask that you please give us 2 business days to thoroughly review all your results before contacting the office for clarification. Should we see a critical lab result, you will be contacted sooner.   If You Need Anything After Your Visit  If you have any questions or concerns for your doctor, please call our main line at [phone] and press option 4 to reach your doctor's medical assistant. If no one answers, please leave a voicemail as directed and we will return your call as soon as possible. Messages left after 4 pm will be answered the following business day.   You may also send us a message via MyChart. We typically respond to MyChart messages within 1-2 business days.  For prescription refills, please ask your pharmacy to contact our office. Our fax number is [phone].  If you have an urgent issue when the clinic is closed that cannot wait until the next business day, you can page your doctor at the number below.    Please note that while we do our best to be available for urgent issues outside of office hours, we are not available 24/7.   If you have an urgent   issue and are unable to reach us, you may choose to seek medical care at your doctor's office, retail clinic, urgent care center, or emergency room.  If you have a medical emergency, please immediately call 911 or go to the emergency department.  Pager Numbers  - Dr. Kowalski: [phone]  - Dr. Moye: [phone]  - Dr. Stewart: [phone]  In the event of inclement weather, please call our main line at [phone] for an update on the status of any delays or closures.  Dermatology Medication Tips: Please keep the boxes that topical medications come in in order to help keep track of the  instructions about where and how to use these. Pharmacies typically print the medication instructions only on the boxes and not directly on the medication tubes.   If your medication is too expensive, please contact our office at [phone] option 4 or send us a message through MyChart.   We are unable to tell what your co-pay for medications will be in advance as this is different depending on your insurance coverage. However, we may be able to find a substitute medication at lower cost or fill out paperwork to get insurance to cover a needed medication.   If a prior authorization is required to get your medication covered by your insurance company, please allow us 1-2 business days to complete this process.  Drug prices often vary depending on where the prescription is filled and some pharmacies may offer cheaper prices.  The website www.goodrx.com contains coupons for medications through different pharmacies. The prices here do not account for what the cost may be with help from insurance (it may be cheaper with your insurance), but the website can give you the price if you did not use any insurance.  - You can print the associated coupon and take it with your prescription to the pharmacy.  - You may also stop by our office during regular business hours and pick up a GoodRx coupon card.  - If you need your prescription sent electronically to a different pharmacy, notify our office through [facility] MyChart or by phone at [phone] option 4.     Si Usted Necesita Algo Despus de Su Visita  Tambin puede enviarnos un mensaje a travs de MyChart. Por lo general respondemos a los mensajes de MyChart en el transcurso de 1 a 2 das hbiles.  Para renovar recetas, por favor pida a su farmacia que se ponga en contacto con nuestra oficina. Nuestro nmero de fax es el [phone].  Si tiene un asunto urgente cuando la clnica est cerrada y que no puede esperar hasta el siguiente da hbil,  puede llamar/localizar a su doctor(a) al nmero que aparece a continuacin.   Por favor, tenga en cuenta que aunque hacemos todo lo posible para estar disponibles para asuntos urgentes fuera del horario de oficina, no estamos disponibles las 24 horas del da, los 7 das de la semana.   Si tiene un problema urgente y no puede comunicarse con nosotros, puede optar por buscar atencin mdica  en el consultorio de su doctor(a), en una clnica privada, en un centro de atencin urgente o en una sala de emergencias.  Si tiene una emergencia mdica, por favor llame inmediatamente al 911 o vaya a la sala de emergencias.  Nmeros de bper  - Dr. Kowalski: [phone]  - Dra. Moye: [phone]  - Dra. Stewart: [phone]  En caso de inclemencias del tiempo, por favor llame a nuestra lnea principal al [phone] para una actualizacin   sobre el estado de cualquier retraso o cierre.  Consejos para la medicacin en dermatologa: Por favor, guarde las cajas en las que vienen los medicamentos de uso tpico para ayudarle a seguir las instrucciones sobre dnde y cmo usarlos. Las farmacias generalmente imprimen las instrucciones del medicamento slo en las cajas y no directamente en los tubos del medicamento.   Si su medicamento es muy caro, por favor, pngase en contacto con nuestra oficina llamando al [phone] y presione la opcin 4 o envenos un mensaje a travs de MyChart.   No podemos decirle cul ser su copago por los medicamentos por adelantado ya que esto es diferente dependiendo de la cobertura de su seguro. Sin embargo, es posible que podamos encontrar un medicamento sustituto a menor costo o llenar un formulario para que el seguro cubra el medicamento que se considera necesario.   Si se requiere una autorizacin previa para que su compaa de seguros cubra su medicamento, por favor permtanos de 1 a 2 das hbiles para completar este proceso.  Los precios de los medicamentos varan con  frecuencia dependiendo del lugar de dnde se surte la receta y alguna farmacias pueden ofrecer precios ms baratos.  El sitio web www.goodrx.com tiene cupones para medicamentos de diferentes farmacias. Los precios aqu no tienen en cuenta lo que podra costar con la ayuda del seguro (puede ser ms barato con su seguro), pero el sitio web puede darle el precio si no utiliz ningn seguro.  - Puede imprimir el cupn correspondiente y llevarlo con su receta a la farmacia.  - Tambin puede pasar por nuestra oficina durante el horario de atencin regular y recoger una tarjeta de cupones de GoodRx.  - Si necesita que su receta se enve electrnicamente a una farmacia diferente, informe a nuestra oficina a travs de MyChart de [place] o por telfono llamando al [phone] y presione la opcin 4.  

## 2022-10-14 NOTE — Progress Notes (Signed)
Follow-Up Visit   Subjective  Donald [name] is a 71 y.o. male who presents for the following: TBSE (Hx MM, BCC . Spot at right calf patient picks at it, spot at right thumb and left scalp patient picks at. ). The patient presents for Total-Body Skin Exam (TBSE) for skin cancer screening and mole check.  The patient has spots, moles and lesions to be evaluated, some may be new or changing and the patient has concerns that these could be cancer.  The following portions of the chart were reviewed this encounter and updated as appropriate:   Tobacco  Allergies  Meds  Problems  Med Hx  Surg Hx  Fam Hx     Review of Systems:  No other skin or systemic complaints except as noted in HPI or Assessment and Plan.  Objective  Well appearing patient in no apparent distress; mood and affect are within normal limits.  A full examination was performed including scalp, head, eyes, ears, nose, lips, neck, chest, axillae, abdomen, back, buttocks, bilateral upper extremities, bilateral lower extremities, hands, feet, fingers, toes, fingernails, and toenails. All findings within normal limits unless otherwise noted below.  Left Elbow x 3, R inferior popliteal x 2 (5) Erythematous stuck-on, waxy papule or plaque  right thumb Verrucous papules -- Discussed viral etiology and contagion.    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 Destruction of lesion - Left Elbow x 3, R inferior popliteal x 2 Complexity: simple   Destruction method: cryotherapy   Informed consent: discussed and consent obtained   Timeout:  patient name, date of birth, surgical site, and procedure verified Lesion destroyed using liquid nitrogen: Yes   Region frozen until ice ball extended beyond lesion: Yes   Outcome: patient tolerated procedure well with no complications   Post-procedure  details: wound care instructions given    Viral warts, with lichen simplex chronicus  right thumb Viral Wart (HPV) Counseling  Discussed viral / HPV (Human Papilloma Virus) etiology and risk of spread /infectivity to other areas of body as well as to other people.  Multiple treatments and methods may be required to clear warts and it is possible treatment may not be successful.  Treatment risks include discoloration; scarring and there is still potential for wart recurrence.  Destruction of lesion - right thumb Complexity: simple   Destruction method: cryotherapy   Informed consent: discussed and consent obtained   Timeout:  patient name, date of birth, surgical site, and procedure verified Lesion destroyed using liquid nitrogen: Yes   Region frozen until ice ball extended beyond lesion: Yes   Outcome: patient tolerated procedure well with no complications   Post-procedure details: wound care instructions given    History of Melanoma in Situ, Lentigo Maligna Type - Left med forehead, 03/04/21 LN2, Imiquimod;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- MOHs completed 11/25/21  - No evidence of recurrence today - Recommend regular full body skin exams - Recommend daily broad spectrum sunscreen SPF 30+ to sun-exposed areas, reapply every 2 hours as needed.  - Call if any new or changing lesions are noted between office visits  History of Basal Cell Carcinoma of the Skin - No evidence of recurrence today - Recommend regular full body skin exams - Recommend daily broad spectrum sunscreen SPF 30+ to sun-exposed areas, reapply every 2 hours as needed.  - Call if any new  or changing lesions are noted between office visits  Lentigines - Scattered tan macules - Due to sun exposure - Benign-appearing, observe - Recommend daily broad spectrum sunscreen SPF 30+ to sun-exposed areas, reapply every 2 hours as needed. - Call for any changes  Seborrheic Keratoses - Stuck-on, waxy,  tan-brown papules and/or plaques  - Benign-appearing - Discussed benign etiology and prognosis. - Observe - Call for any changes  Melanocytic Nevi - Tan-brown and/or pink-flesh-colored symmetric macules and papules - Benign appearing on exam today - Observation - Call clinic for new or changing moles - Recommend daily use of broad spectrum spf 30+ sunscreen to sun-exposed areas.   Hemangiomas - Red papules - Discussed benign nature - Observe - Call for any changes  Actinic Damage - Chronic condition, secondary to cumulative UV/sun exposure - diffuse scaly erythematous macules with underlying dyspigmentation - Recommend daily broad spectrum sunscreen SPF 30+ to sun-exposed areas, reapply every 2 hours as needed.  - Staying in the shade or wearing long sleeves, sun glasses (UVA+UVB protection) and wide brim hats (4-inch brim around the entire circumference of the hat) are also recommended for sun protection.  - Call for new or changing lesions.  Skin cancer screening performed today.  Return in about 4 months (around 02/13/2023) for TBSE, Hx MMis, Hx BCC, Hx AK.  Graciella Belton, RMA, am acting as scribe for [name], MD . Documentation: I have reviewed the above documentation for accuracy and completeness, and I agree with the above.  [name], MD

## 2022-10-23 ENCOUNTER — Encounter: Payer: Self-pay | Admitting: Dermatology

## 2022-11-11 ENCOUNTER — Ambulatory Visit: Payer: Medicare Other | Admitting: Neurosurgery

## 2022-11-24 NOTE — Progress Notes (Unsigned)
Referring Physician:  Geronimo Boot, PA-C [address] [place],  [place] [address]  Primary Physician:  Baxter Hire, MD  History of Present Illness: 11/25/2022 Donald [name] is here today with a chief complaint of left sided low back pain that radiates into the left lateral thigh to the knee with occasional weakness in the leg.  He had a right foot drop that began a couple of years ago but has improved.  He is working out regularly.  He did fracture his right ankle many years ago.  Walking makes his symptoms worse.  Laying in bed makes his symptoms worse.  Currently he has minimal pain and is very functional.  He is able to do all the things he would like to do currently.     Conservative measures: EMG on 09/02/22 at [facility]:  scheduled for PT evaluation on 12/14/22 at [facility] Multimodal medical therapy including regular antiinflammatories:  aleve Injections:  has received epidural steroid injections 08/06/2022: Left L4-5 transforaminal ESI (30% relief, dexamethasone 13 mg)  05/03/2022: Left L4-5 transforaminal ESI (50% relief, dexamethasone 13 mg) 03/09/2022: Left L4-5 transforaminal ESI (3 to 4 weeks of complete relief and then gradual return of pain)   Past Surgery:  Lumbar Surgery in 1984 by Dr. Atilano Ina has no symptoms of cervical myelopathy.  The symptoms are causing a significant impact on the patient's life.   I have utilized the care everywhere function in epic to review the outside records available from external health systems.  Review of Systems:  A 10 point review of systems is negative, except for the pertinent positives and negatives detailed in the HPI.  Past Medical History: Past Medical History:  Diagnosis Date   Arthritis    Basal cell carcinoma 02/23/2008   Left shoulder.    COVID-19 06/2020   ED (erectile dysfunction)    Hx of basal cell carcinoma 01/24/2017   Left lateral canthus. Nodular pattern.  Tx: EDC   Hyperlipidemia    history of elevated cholesterol, better after pt started exercising.   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   Pre-diabetes     Past Surgical History: Past Surgical History:  Procedure Laterality Date   BACK SURGERY  1984   L4-5   CATARACT EXTRACTION W/PHACO Right 06/03/2015   Procedure: CATARACT EXTRACTION PHACO AND INTRAOCULAR LENS PLACEMENT (IOC);  Surgeon: Birder Robson, MD;  Location: [facility] ORS;  Service: Ophthalmology;  Laterality: Right;  [place]: 01:26.5AP%: 19.7CDE: 17.07Fluid lot# [id number]   COLONOSCOPY N/A 08/16/2016   Procedure: COLONOSCOPY;  Surgeon: Lollie Sails, MD;  Location: [facility] ENDOSCOPY;  Service: Endoscopy;  Laterality: N/A;   COLONOSCOPY N/A 02/22/2022   Procedure: COLONOSCOPY;  Surgeon: Annamaria Helling, DO;  Location: [facility] ENDOSCOPY;  Service: Gastroenterology;  Laterality: N/A;   CYSTOSCOPY WITH INSERTION OF UROLIFT N/A 12/11/2020   Procedure: CYSTOSCOPY WITH INSERTION OF UROLIFT;  Surgeon: Royston Cowper, MD;  Location: [facility] ORS;  Service: Urology;  Laterality: N/A;   EYE SURGERY     FRACTURE SURGERY Right 1976   ankle   HERNIA REPAIR Left 1972   nausea and vomiting with general anesthesia   JOINT REPLACEMENT Left 2012   JOINT REPLACEMENT  Right 2014   knees replaced     TOTAL HIP ARTHROPLASTY Right 12/08/2016   Procedure: TOTAL HIP ARTHROPLASTY;  Surgeon: Dereck Leep, MD;  Location: [facility] ORS;  Service: Orthopedics;  Laterality: Right;    Allergies: Allergies as of 11/25/2022   (No Known Allergies)    Medications: Current Meds  Medication Sig   albuterol (VENTOLIN HFA) 108 (90 Base) MCG/ACT inhaler Inhale 2 puffs into the lungs daily as needed.   aspirin 325  MG tablet Take 325 mg by mouth daily.   b complex vitamins capsule Take 1 capsule by mouth daily.   cyanocobalamin (VITAMIN B12) 500 MCG tablet Take 500 mcg by mouth daily.   fluticasone (FLONASE) 50 MCG/ACT nasal spray Place 1 spray into both nostrils daily as needed for allergies.   losartan (COZAAR) 25 MG tablet Take 1 tablet by mouth daily.   Multiple Vitamin (MULTIVITAMIN WITH MINERALS) TABS tablet Take 1 tablet by mouth daily.   Polyethyl Glycol-Propyl Glycol (SYSTANE OP) Place 1 drop into both eyes daily as needed (dry eyes).   sildenafil (REVATIO) 20 MG tablet Take 20 mg by mouth daily.    Testosterone (AXIRON TD) Place 1 application onto the skin daily. Testosterone 200 mg cream   TURMERIC CURCUMIN PO Take 1 tablet by mouth daily. With coq 10   Zinc 30 MG TABS Take 30 mg by mouth daily.    Social History: Social History   Tobacco Use   Smoking status: Never   Smokeless tobacco: Former    Types: Chew    Quit date: 03/2022  Vaping Use   Vaping Use: Never used  Substance Use Topics   Alcohol use: Yes    Alcohol/week: 3.0 - 5.0 standard drinks of alcohol    Types: 1 - 2 Glasses of wine, 2 - 3 Cans of beer per week    Comment: OCC   Drug use: No    Comment: DELTA 8 OCC    Family Medical History: No family history on file.  Physical Examination: Vitals:   11/25/22 0833  BP: 130/72    General: Patient is well developed, well nourished, calm, collected, and in no apparent distress. Attention to examination is appropriate.  Neck:   Supple.  Full range of motion.  Respiratory: Patient is breathing without any difficulty.   NEUROLOGICAL:     Awake, alert, oriented to person, place, and time.  Speech is clear and fluent.   Cranial Nerves: Pupils equal round and reactive to light.  Facial tone is symmetric.  Facial sensation is symmetric. Shoulder shrug is symmetric. Tongue protrusion is midline.  There is no pronator drift.  ROM of spine: full.    Strength: Side  Biceps Triceps Deltoid Interossei Grip Wrist Ext. Wrist Flex.  R '5 5 5 5 5 5 5  '$ L '5 5 5 5 5 5 5   '$ Side Iliopsoas Quads Hamstring PF DF EHL  R '5 5 5 5 3 3  '$ L '5 5 5 5 5 5   '$ Reflexes are 1+ and symmetric at the biceps, triceps, brachioradialis, patella and achilles.   Hoffman's is absent.   Bilateral upper and lower extremity sensation is intact to light touch.    No evidence of dysmetria noted.  Gait is slightly abnormal due to R foot drop.     Medical Decision Making  Imaging: MRI L spine 08/24/2022 IMPRESSION: 1. No change since earlier this year to explain symptoms. No right L5 impingement. 2. 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     Electronically Signed   By: Jorje Guild M.D.   On: 08/27/2022 05:15  I have personally reviewed the images and agree with the above interpretation.  Assessment and Plan: Donald [name] is a pleasant 72 y.o. male with some low back pain and an improving right sided foot drop.  He has EMG documented peripheral nerve dysfunction.  At this point, his symptoms are not bothering him terribly.  I would not recommend any surgical intervention at this time.  If his pain worsens, we will reevaluate.  If his peripheral nerve function worsens, I will arrange an evaluation with a peripheral nerve specialist.  Will see him back on an as-needed basis.  I spent a total of 30 minutes in this patient's care today. This time was spent reviewing pertinent records including imaging studies, obtaining and confirming history, performing a directed evaluation, formulating and discussing my recommendations, and documenting the visit within the medical record.    Thank you for involving me in the care of this patient.      Stephonie Wilcoxen K. Izora Ribas MD, [facility] Neurosurgery

## 2022-11-25 ENCOUNTER — Encounter: Payer: Self-pay | Admitting: Neurosurgery

## 2022-11-25 ENCOUNTER — Ambulatory Visit: Payer: Medicare Other | Admitting: Neurosurgery

## 2022-11-25 VITALS — BP 130/72 | Ht 72.0 in | Wt 262.4 lb

## 2022-11-25 DIAGNOSIS — M48061 Spinal stenosis, lumbar region without neurogenic claudication: Secondary | ICD-10-CM

## 2022-11-25 DIAGNOSIS — R29898 Other symptoms and signs involving the musculoskeletal system: Secondary | ICD-10-CM

## 2023-02-24 ENCOUNTER — Encounter: Payer: Self-pay | Admitting: Dermatology

## 2023-02-24 ENCOUNTER — Ambulatory Visit: Payer: Medicare Other | Admitting: Dermatology

## 2023-02-24 VITALS — BP 154/73

## 2023-02-24 DIAGNOSIS — Z85820 Personal history of malignant melanoma of skin: Secondary | ICD-10-CM

## 2023-02-24 DIAGNOSIS — B079 Viral wart, unspecified: Secondary | ICD-10-CM

## 2023-02-24 DIAGNOSIS — D1801 Hemangioma of skin and subcutaneous tissue: Secondary | ICD-10-CM

## 2023-02-24 DIAGNOSIS — X32XXXA Exposure to sunlight, initial encounter: Secondary | ICD-10-CM

## 2023-02-24 DIAGNOSIS — Z1283 Encounter for screening for malignant neoplasm of skin: Secondary | ICD-10-CM

## 2023-02-24 DIAGNOSIS — Z85828 Personal history of other malignant neoplasm of skin: Secondary | ICD-10-CM

## 2023-02-24 DIAGNOSIS — Z872 Personal history of diseases of the skin and subcutaneous tissue: Secondary | ICD-10-CM

## 2023-02-24 DIAGNOSIS — Z7189 Other specified counseling: Secondary | ICD-10-CM

## 2023-02-24 DIAGNOSIS — L814 Other melanin hyperpigmentation: Secondary | ICD-10-CM

## 2023-02-24 DIAGNOSIS — L820 Inflamed seborrheic keratosis: Secondary | ICD-10-CM

## 2023-02-24 DIAGNOSIS — M2570 Osteophyte, unspecified joint: Secondary | ICD-10-CM

## 2023-02-24 DIAGNOSIS — Z86006 Personal history of melanoma in-situ: Secondary | ICD-10-CM

## 2023-02-24 DIAGNOSIS — L821 Other seborrheic keratosis: Secondary | ICD-10-CM

## 2023-02-24 DIAGNOSIS — L578 Other skin changes due to chronic exposure to nonionizing radiation: Secondary | ICD-10-CM

## 2023-02-24 DIAGNOSIS — W908XXA Exposure to other nonionizing radiation, initial encounter: Secondary | ICD-10-CM

## 2023-02-24 DIAGNOSIS — Z79899 Other long term (current) drug therapy: Secondary | ICD-10-CM

## 2023-02-24 DIAGNOSIS — B353 Tinea pedis: Secondary | ICD-10-CM

## 2023-02-24 MED ORDER — KETOCONAZOLE 2 % EX CREA: 1.0000 | TOPICAL_CREAM | Freq: Every day | CUTANEOUS | 11 refills | Status: DC

## 2023-02-24 NOTE — Progress Notes (Signed)
Follow-Up Visit   Subjective  Donald [name] is a 72 y.o. male who presents for the following: Skin Cancer Screening and Full Body Skin Exam, hx of Melanoma IS Lentigo maligna type L med forehead, hx of BCCs, hx of Aks, check spot L scalp >68m, wart R thumb  The patient presents for Total-Body Skin Exam (TBSE) for skin cancer screening and mole check. The patient has spots, moles and lesions to be evaluated, some may be new or changing and the patient has concerns that these could be cancer.    The following portions of the chart were reviewed this encounter and updated as appropriate: medications, allergies, medical history  Review of Systems:  No other skin or systemic complaints except as noted in HPI or Assessment and Plan.  Objective  Well appearing patient in no apparent distress; mood and affect are within normal limits.  A full examination was performed including scalp, head, eyes, ears, nose, lips, neck, chest, axillae, abdomen, back, buttocks, bilateral upper extremities, bilateral lower extremities, hands, feet, fingers, toes, fingernails, and toenails. All findings within normal limits unless otherwise noted below.   Relevant physical exam findings are noted in the Assessment and Plan.  R thumb x 2 (2) Verrucous papules -- Discussed viral etiology and contagion.   R temple x 4 (4) Stuck on waxy paps with erythema    Assessment & Plan   LENTIGINES, SEBORRHEIC KERATOSES, HEMANGIOMAS - Benign normal skin lesions - Benign-appearing - Call for any changes  MELANOCYTIC NEVI - Tan-brown and/or pink-flesh-colored symmetric macules and papules - Benign appearing on exam today - Observation - Call clinic for new or changing moles - Recommend daily use of broad spectrum spf 30+ sunscreen to sun-exposed areas.   ACTINIC DAMAGE - Chronic condition, secondary to cumulative UV/sun exposure - diffuse scaly erythematous macules with underlying dyspigmentation - Recommend daily  broad spectrum sunscreen SPF 30+ to sun-exposed areas, reapply every 2 hours as needed.  - Staying in the shade or wearing long sleeves, sun glasses (UVA+UVB protection) and wide brim hats (4-inch brim around the entire circumference of the hat) are also recommended for sun protection.  - Call for new or changing lesions.  SKIN CANCER SCREENING PERFORMED TODAY.  HISTORY OF MELANOMA IN SITU Lentigo Maligna type - No evidence of recurrence today - No lymphadenopathy - Recommend regular full body skin exams - Recommend daily broad spectrum sunscreen SPF 30+ to sun-exposed areas, reapply every 2 hours as needed.  - Call if any new or changing lesions are noted between office visits  - L med forehead, Imiquimod and LN2 05/22, Mohs 11/25/21  Viral warts, unspecified type (2) R thumb x 2  Viral Wart (HPV) Counseling  Discussed viral / HPV (Human Papilloma Virus) etiology and risk of spread /infectivity to other areas of body as well as to other people.  Multiple treatments and methods may be required to clear warts and it is possible treatment may not be successful.  Treatment risks include discoloration; scarring and there is still potential for wart recurrence.  Destruction of lesion - R thumb x 2 Complexity: simple   Destruction method: cryotherapy   Informed consent: discussed and consent obtained   Timeout:  patient name, date of birth, surgical site, and procedure verified Lesion destroyed using liquid nitrogen: Yes   Region frozen until ice ball extended beyond lesion: Yes   Outcome: patient tolerated procedure well with no complications   Post-procedure details: wound care instructions given    Inflamed  seborrheic keratosis (4) R temple x 4  Symptomatic, irritating, patient would like treated.   Destruction of lesion - R temple x 4 Complexity: simple   Destruction method: cryotherapy   Informed consent: discussed and consent obtained   Timeout:  patient name, date of birth,  surgical site, and procedure verified Lesion destroyed using liquid nitrogen: Yes   Region frozen until ice ball extended beyond lesion: Yes   Outcome: patient tolerated procedure well with no complications   Post-procedure details: wound care instructions given     HISTORY OF BASAL CELL CARCINOMA OF THE SKIN - No evidence of recurrence today - Recommend regular full body skin exams - Recommend daily broad spectrum sunscreen SPF 30+ to sun-exposed areas, reapply every 2 hours as needed.  - Call if any new or changing lesions are noted between office visits  - L shoulder, L lat canthus  BONY CALLUS OF THE SKULL Exam: pap 1.5cm L vertex scalp  Treatment Plan: Benign-appearing.  Observation.  Call clinic for new or changing lesions.  Recommend daily use of broad spectrum spf 30+ sunscreen to sun-exposed areas.    TINEA PEDIS Chronic and persistent condition with duration or expected duration over one year. Condition is symptomatic / bothersome to patient. Not to goal.  Exam: Scaling R heel  Treatment Plan: Start Ketoconazole 2% cr qhs to both feet   HISTORY OF PRECANCEROUS ACTINIC KERATOSIS - site(s) of PreCancerous Actinic Keratosis clear today. - these may recur and new lesions may form requiring treatment to prevent transformation into skin cancer - observe for new or changing spots and contact [facility] for appointment if occur - photoprotection with sun protective clothing; sunglasses and broad spectrum sunscreen with SPF of at least 30 + and frequent self skin exams recommended - yearly exams by a dermatologist recommended for persons with history of PreCancerous Actinic Keratoses   Return in about 6 months (around 08/27/2023) for TBSE, Hx of Melanoma IS, Hx of BCC, Hx of AKs.  I, Donald [name], RMA, am acting as scribe for Donald Sans, MD .   Documentation: I have reviewed the above documentation for accuracy and completeness, and I agree with the  above.  Donald Sans, MD

## 2023-02-24 NOTE — Patient Instructions (Signed)
Due to recent changes in healthcare laws, you may see results of your pathology and/or laboratory studies on MyChart before the doctors have had a chance to review them. We understand that in some cases there may be results that are confusing or concerning to you. Please understand that not all results are received at the same time and often the doctors may need to interpret multiple results in order to provide you with the best plan of care or course of treatment. Therefore, we ask that you please give us 2 business days to thoroughly review all your results before contacting the office for clarification. Should we see a critical lab result, you will be contacted sooner.   If You Need Anything After Your Visit  If you have any questions or concerns for your doctor, please call our main line at [phone] and press option 4 to reach your doctor's medical assistant. If no one answers, please leave a voicemail as directed and we will return your call as soon as possible. Messages left after 4 pm will be answered the following business day.   You may also send us a message via MyChart. We typically respond to MyChart messages within 1-2 business days.  For prescription refills, please ask your pharmacy to contact our office. Our fax number is [phone].  If you have an urgent issue when the clinic is closed that cannot wait until the next business day, you can page your doctor at the number below.    Please note that while we do our best to be available for urgent issues outside of office hours, we are not available 24/7.   If you have an urgent issue and are unable to reach us, you may choose to seek medical care at your doctor's office, retail clinic, urgent care center, or emergency room.  If you have a medical emergency, please immediately call 911 or go to the emergency department.  Pager Numbers  - Dr. Kowalski: [phone]  - Dr. Moye: [phone]  - Dr. Stewart:  [phone]  In the event of inclement weather, please call our main line at [phone] for an update on the status of any delays or closures.  Dermatology Medication Tips: Please keep the boxes that topical medications come in in order to help keep track of the instructions about where and how to use these. Pharmacies typically print the medication instructions only on the boxes and not directly on the medication tubes.   If your medication is too expensive, please contact our office at [phone] option 4 or send us a message through MyChart.   We are unable to tell what your co-pay for medications will be in advance as this is different depending on your insurance coverage. However, we may be able to find a substitute medication at lower cost or fill out paperwork to get insurance to cover a needed medication.   If a prior authorization is required to get your medication covered by your insurance company, please allow us 1-2 business days to complete this process.  Drug prices often vary depending on where the prescription is filled and some pharmacies may offer cheaper prices.  The website www.goodrx.com contains coupons for medications through different pharmacies. The prices here do not account for what the cost may be with help from insurance (it may be cheaper with your insurance), but the website can give you the price if you did not use any insurance.  - You can print the associated coupon and take it with   your prescription to the pharmacy.  - You may also stop by our office during regular business hours and pick up a GoodRx coupon card.  - If you need your prescription sent electronically to a different pharmacy, notify our office through [facility] MyChart or by phone at [phone] option 4.     Si Usted Necesita Algo Despus de Su Visita  Tambin puede enviarnos un mensaje a travs de MyChart. Por lo general respondemos a los mensajes de MyChart en el transcurso de 1 a 2  das hbiles.  Para renovar recetas, por favor pida a su farmacia que se ponga en contacto con nuestra oficina. Nuestro nmero de fax es el [phone].  Si tiene un asunto urgente cuando la clnica est cerrada y que no puede esperar hasta el siguiente da hbil, puede llamar/localizar a su doctor(a) al nmero que aparece a continuacin.   Por favor, tenga en cuenta que aunque hacemos todo lo posible para estar disponibles para asuntos urgentes fuera del horario de oficina, no estamos disponibles las 24 horas del da, los 7 das de la semana.   Si tiene un problema urgente y no puede comunicarse con nosotros, puede optar por buscar atencin mdica  en el consultorio de su doctor(a), en una clnica privada, en un centro de atencin urgente o en una sala de emergencias.  Si tiene una emergencia mdica, por favor llame inmediatamente al 911 o vaya a la sala de emergencias.  Nmeros de bper  - Dr. Kowalski: [phone]  - Dra. Moye: [phone]  - Dra. Stewart: [phone]  En caso de inclemencias del tiempo, por favor llame a nuestra lnea principal al [phone] para una actualizacin sobre el estado de cualquier retraso o cierre.  Consejos para la medicacin en dermatologa: Por favor, guarde las cajas en las que vienen los medicamentos de uso tpico para ayudarle a seguir las instrucciones sobre dnde y cmo usarlos. Las farmacias generalmente imprimen las instrucciones del medicamento slo en las cajas y no directamente en los tubos del medicamento.   Si su medicamento es muy caro, por favor, pngase en contacto con nuestra oficina llamando al [phone] y presione la opcin 4 o envenos un mensaje a travs de MyChart.   No podemos decirle cul ser su copago por los medicamentos por adelantado ya que esto es diferente dependiendo de la cobertura de su seguro. Sin embargo, es posible que podamos encontrar un medicamento sustituto a menor costo o llenar un formulario para que el  seguro cubra el medicamento que se considera necesario.   Si se requiere una autorizacin previa para que su compaa de seguros cubra su medicamento, por favor permtanos de 1 a 2 das hbiles para completar este proceso.  Los precios de los medicamentos varan con frecuencia dependiendo del lugar de dnde se surte la receta y alguna farmacias pueden ofrecer precios ms baratos.  El sitio web www.goodrx.com tiene cupones para medicamentos de diferentes farmacias. Los precios aqu no tienen en cuenta lo que podra costar con la ayuda del seguro (puede ser ms barato con su seguro), pero el sitio web puede darle el precio si no utiliz ningn seguro.  - Puede imprimir el cupn correspondiente y llevarlo con su receta a la farmacia.  - Tambin puede pasar por nuestra oficina durante el horario de atencin regular y recoger una tarjeta de cupones de GoodRx.  - Si necesita que su receta se enve electrnicamente a una farmacia diferente, informe a nuestra oficina a travs de MyChart de [facility] [place]   o por telfono llamando al [phone] y presione la opcin 4.  

## 2023-03-03 ENCOUNTER — Encounter: Payer: Self-pay | Admitting: Dermatology

## 2023-04-27 ENCOUNTER — Encounter: Payer: Self-pay | Admitting: Urology

## 2023-04-27 ENCOUNTER — Ambulatory Visit: Payer: Medicare Other | Admitting: Urology

## 2023-04-27 VITALS — BP 157/76 | HR 66

## 2023-04-27 DIAGNOSIS — E291 Testicular hypofunction: Secondary | ICD-10-CM | POA: Diagnosis not present

## 2023-04-27 DIAGNOSIS — R350 Frequency of micturition: Secondary | ICD-10-CM

## 2023-04-27 DIAGNOSIS — N401 Enlarged prostate with lower urinary tract symptoms: Secondary | ICD-10-CM

## 2023-04-27 LAB — MICROSCOPIC EXAMINATION: Bacteria, UA: NONE SEEN

## 2023-04-27 LAB — URINALYSIS, COMPLETE
Bilirubin, UA: NEGATIVE
Glucose, UA: NEGATIVE
Leukocytes,UA: NEGATIVE
Nitrite, UA: NEGATIVE
Protein,UA: NEGATIVE
RBC, UA: NEGATIVE
Specific Gravity, UA: 1.01 (ref 1.005–1.030)
Urobilinogen, Ur: 0.2 mg/dL (ref 0.2–1.0)
pH, UA: 5.5 (ref 5.0–7.5)

## 2023-04-27 LAB — BLADDER SCAN AMB NON-IMAGING: Scan Result: 49

## 2023-04-27 MED ORDER — TADALAFIL 5 MG PO TABS
5.0000 mg | ORAL_TABLET | Freq: Every day | ORAL | 11 refills | Status: DC
Start: 2023-04-27 — End: 2024-04-30

## 2023-04-27 MED ORDER — SILDENAFIL CITRATE 20 MG PO TABS
ORAL_TABLET | ORAL | 11 refills | Status: DC
Start: 2023-04-27 — End: 2024-04-30

## 2023-04-27 NOTE — Progress Notes (Addendum)
Donald [name],acting as a scribe for Vanna Scotland, MD.,have documented all relevant documentation on the behalf of Vanna Scotland, MD,as directed by  Vanna Scotland, MD while in the presence of Vanna Scotland, MD.  04/27/2023 9:36 AM   Donald [name] [birth year] [id number]  Referring provider: Gracelyn Nurse, MD [address] [place],  [place] [address]  Chief Complaint  Patient presents with   New Patient (Initial Visit)   Hypogonadism    HPI: 72 year old male who presents today to establish care and is formerly a patient of Dr. Evelene Croon.   I personally reviewed his notes from Dr. Amada Jupiter office. He has a personal history of BPH with LUTS and underwent a Uro lift in 2022. He also has a personal history of hypogonadism and has been managed on testosterone cream 20% 1 cc daily. He has erectile dysfunction and uses tadalafil 5 mg daily as well as sildenafil 20 mg as needed. In addition to that, his last rectal exam was in 09/2022.   His most recent PSA on 10/11/2022 was 1.3.   His urinalysis today was negative.   Today, he reports occasional urgency, especially when consuming large amounts of water, but manages well with Kegel exercises and pelvic floor strengthening. He denies leakage unless complacent with exercises.   Results for orders placed or performed in visit on 04/27/23  Microscopic Examination   Urine  Result Value Ref Range   WBC, UA 0-5 0 - 5 /hpf   RBC, Urine 0-2 0 - 2 /hpf   Epithelial Cells (non renal) 0-10 0 - 10 /hpf   Bacteria, UA None seen None seen/Few  Urinalysis, Complete  Result Value Ref Range   Specific Gravity, UA 1.010 1.005 - 1.030   pH, UA 5.5 5.0 - 7.5   Color, UA Yellow Yellow   Appearance Ur Clear Clear   Leukocytes,UA Negative Negative   Protein,UA Negative Negative/Trace   Glucose, UA Negative Negative   Ketones, UA Trace (A) Negative   RBC, UA Negative Negative   Bilirubin, UA Negative Negative   Urobilinogen, Ur 0.2 0.2 -  1.0 mg/dL   Nitrite, UA Negative Negative   Microscopic Examination See below:   BLADDER SCAN AMB NON-IMAGING  Result Value Ref Range   Scan Result 49 ml     IPSS     Row Name 04/27/23 0900         International Prostate Symptom Score   How often have you had the sensation of not emptying your bladder? Less than half the time     How often have you had to urinate less than every two hours? About half the time     How often have you found you stopped and started again several times when you urinated? Less than half the time     How often have you found it difficult to postpone urination? Less than half the time     How often have you had a weak urinary stream? Less than 1 in 5 times     How often have you had to strain to start urination? Not at All     How many times did you typically get up at night to urinate? None     Total IPSS Score 10       Quality of Life due to urinary symptoms   If you were to spend the rest of your life with your urinary condition just the way it is now how would  you feel about that? Pleased              Score:  1-7 Mild 8-19 Moderate 20-35 Severe   PMH: Past Medical History:  Diagnosis Date   Actinic keratosis    Arthritis    Basal cell carcinoma 02/23/2008   Left shoulder.    COVID-19 06/2020   ED (erectile dysfunction)    Hx of basal cell carcinoma 01/24/2017   Left lateral canthus. Nodular pattern. Tx: EDC   Hyperlipidemia    history of elevated cholesterol, better after pt started exercising.   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   Pre-diabetes     Surgical History: Past Surgical History:  Procedure Laterality Date   BACK SURGERY  1984   L4-5   CATARACT EXTRACTION  W/PHACO Right 06/03/2015   Procedure: CATARACT EXTRACTION PHACO AND INTRAOCULAR LENS PLACEMENT (IOC);  Surgeon: Galen Manila, MD;  Location: [facility] ORS;  Service: Ophthalmology;  Laterality: Right;  [place]: 01:26.5AP%: 19.7CDE: 17.07Fluid lot# [id number]   COLONOSCOPY N/A 08/16/2016   Procedure: COLONOSCOPY;  Surgeon: Christena Deem, MD;  Location: [facility] ENDOSCOPY;  Service: Endoscopy;  Laterality: N/A;   COLONOSCOPY N/A 02/22/2022   Procedure: COLONOSCOPY;  Surgeon: Jaynie Collins, DO;  Location: [facility] ENDOSCOPY;  Service: Gastroenterology;  Laterality: N/A;   CYSTOSCOPY WITH INSERTION OF UROLIFT N/A 12/11/2020   Procedure: CYSTOSCOPY WITH INSERTION OF UROLIFT;  Surgeon: Orson Ape, MD;  Location: [facility] ORS;  Service: Urology;  Laterality: N/A;   EYE SURGERY     FRACTURE SURGERY Right 1976   ankle   HERNIA REPAIR Left 1972   nausea and vomiting with general anesthesia   JOINT REPLACEMENT Left 2012   JOINT REPLACEMENT Right 2014   knees replaced     TOTAL HIP ARTHROPLASTY Right 12/08/2016   Procedure: TOTAL HIP ARTHROPLASTY;  Surgeon: Donato Heinz, MD;  Location: [facility] ORS;  Service: Orthopedics;  Laterality: Right;    Home Medications:  Allergies as of 04/27/2023   No Known Allergies      Medication List        Accurate as of April 27, 2023  9:36 AM. If you have any questions, ask your nurse or doctor.          albuterol 108 (90 Base) MCG/ACT inhaler Commonly known as: VENTOLIN HFA Inhale 2 puffs into the lungs daily as needed.   aspirin 325 MG tablet Take 325 mg by mouth daily.   AXIRON TD Place 1 application onto the skin daily. Testosterone 200 mg cream   b complex vitamins capsule Take 1 capsule by mouth daily.   celecoxib 100 MG capsule Commonly known as: CELEBREX Take by mouth.   cyanocobalamin 500 MCG tablet Commonly known as: VITAMIN B12 Take 500 mcg by mouth daily.   fluticasone 50 MCG/ACT nasal spray Commonly known as: FLONASE Place 1 spray  into both nostrils daily as needed for allergies.   ketoconazole 2 % cream Commonly known as: NIZORAL Apply 1 Application topically at bedtime. Qhs to feet   losartan 25 MG tablet Commonly known as: COZAAR Take 1 tablet by mouth daily.   multivitamin with minerals Tabs tablet Take 1 tablet by mouth daily.   sildenafil 20 MG tablet Commonly known  as: REVATIO 1 to 5 tablets as needed an hour prior to intercourse. What changed:  how much to take how to take this when to take this additional instructions Changed by: Vanna Scotland, MD   SYSTANE OP Place 1 drop into both eyes daily as needed (dry eyes).   tadalafil 5 MG tablet Commonly known as: CIALIS Take 1 tablet (5 mg total) by mouth daily. Started by: Vanna Scotland, MD   TURMERIC CURCUMIN PO Take 1 tablet by mouth daily. With coq 10   Zinc 30 MG Tabs Take 30 mg by mouth daily.        Social History:  reports that he has never smoked. He quit smokeless tobacco use about 13 months ago.  His smokeless tobacco use included chew. He reports current alcohol use of about 3.0 - 5.0 standard drinks of alcohol per week. He reports that he does not use drugs.   Physical Exam: BP (!) 157/76   Pulse 66   Constitutional:  Alert and oriented, No acute distress. HEENT: [place] AT, moist mucus membranes.  Trachea midline, no masses. Neurologic: Grossly intact, no focal deficits, moving all 4 extremities. Psychiatric: Normal mood and affect.   Assessment & Plan:    1. BPH - Symptoms are well controlled - Status post Uro lift and is emptying well -PSA screening due, rectal exam is up-to-date  2. Erectile dysfunction - Continue Cialis 5 mg daily and 20 mg sildenafil as needed  3. Hypogonadism - Due for labs today, we will check PSA, testosterone, and H&H - He would like to switch from testosterone cream to injectables. He is going to check his formulary and contact us on MyChart.  -He will get back to [place] later today about  his insurance coverage.  If he does transition to injectables, he will need teaching.  If he does transition to injectables, will hold off on labs and schedule them about a month after he gets started to check levels.   Return in about 6 months (around 10/28/2023) for with labs to be done 1 momth after any change in testosterone treatment .  I have reviewed the above documentation for accuracy and completeness, and I agree with the above.   Vanna Scotland, MD    [facility] [address] [place], [place] [address] [phone]

## 2023-04-27 NOTE — Progress Notes (Signed)
Donald [name] presents for an office visit. BP today is _157/76__________. He is complaint with BP medication. Greater than 140/90. Provider  notified. Pt advised to__f/u with PCP____________. Pt voiced understanding.

## 2023-04-29 MED ORDER — TESTOSTERONE CYPIONATE 200 MG/ML IM SOLN
100.0000 mg | INTRAMUSCULAR | 0 refills | Status: DC
Start: 2023-04-29 — End: 2023-09-20

## 2023-04-29 NOTE — Telephone Encounter (Signed)
Patient advised and appointment made for injection teaching with [facility].

## 2023-05-19 NOTE — Progress Notes (Signed)
Donald [name] is a 72 y.o.  male with testosterone deficiency who presents today for instruction on delivering an IM injection of testosterone cypionate.    I instructed the patient to identify the concentration of his testosterone. Testosterone for injection is usually in the form of testosterone cypionate. These liquids come in multiple concentrations, so before giving an injection, it's very important to make sure that his intended dosage takes into account the concentration of the testosterone serum. Usually, testosterone comes in a concentration of either 100 mg/ml or 200 mg/ml.  We typically use the 200 mg/mL in this office.    Using a sterile, 18 G needle and 3 cc syringe, the testosterone cypionate (lot # [id number] @2026 /09 was drawn up for a 1 cc injection.  To draw up the dose, I demonstrated how to first draw air into the syringe equal to the volume of the dosage. Then, wipe the top of the medication bottle with an alcohol wipe, insert the needle through the lid and into the medication, and push the air from your syringe into the bottle. Turn the bottle upside down and draw out the exact dosage of testosterone.  I demonstrated how to aspirate the syringe by hinge the syringe with its needle uncapped and pointing up in front of him.  Looking for air bubbles in the syringe. Flick the side of the syringe to get these bubbles to rise to the top.    When the dosage is bubble-free, I slowly depressed the plunger to force the air at the top of the syringe out stopping when a tiny drop of medication comes out of the tip of the syringe.  I advised him to be certain no air remained in the syringe as injecting air is very dangerous.  Being careful not to squirt or spray a significant portion of the dosage onto the floor.  Preparing the injection site, outer middle third of the vastus lateralis muscle of the thigh, I took a sterile alcohol pad and wipe the immediate area around where I intended him to  inject.   I then demonstrated how to change the needle from the 18 G to the 21 G needle.  I then gave him the syringe for injecting.  He correctly identified the injection location.  He held the syringe like a dart at a 90-degree angle above the sterile injection site. Quickly plunged it into the flesh. Before depressing the plunger, he drew back on it slightly and no blood was seen.  I advised him that if blood flashed in the syringe, he would need to remove the needle and then select a different location as he was in the vein.  Inject the medication at a steady, controlled pace.  He fully depressed the plunger, slowly pull the needle out. Pressing around the injection site with a sterile cotton swab as he did so - this preventing the emerging needle from pulling on the skin and causing extra pain.  We assessed the needle entry point for bleeding, and applied a sterile Band-Aid and/or cotton swab if needed. Disposed of the used needle and syringe in a proper sharps container.  I advised him to acquire a sharps container for his personal use at home.  I advised him that If, after injection, he experienced redness, swelling, or discomfort beyond that of normal soreness at the site of injection, call our office for an appointment and instructions.  He is to always store his medication at the recommended temperature, and always  check the expiration date on the bottle. If it's expired, don't use it.  Of course, keep all of med's out of reach of children.  Do not change his dose without consulting your provider.  At testosterone level, hemoglobin hematocrit were drawn today as we have no current levels available to [place].  His starting dose will be 1 cc every 2 weeks.  He will return 1 week after his fourth injection for a testosterone level  Kristine Tiley, PA-C  I spent 15 minutes on the day of the encounter to include pre-visit record review, face-to-face time with the patient, and post-visit  ordering of tests.

## 2023-05-20 ENCOUNTER — Ambulatory Visit: Payer: Medicare Other | Admitting: Urology

## 2023-05-20 VITALS — BP 132/59 | HR 70 | Wt 235.6 lb

## 2023-05-20 DIAGNOSIS — E291 Testicular hypofunction: Secondary | ICD-10-CM | POA: Diagnosis not present

## 2023-05-20 MED ORDER — SYRINGE 2-3 ML 3 ML MISC
1.0000 mg | 3 refills | Status: AC
Start: 2023-05-20 — End: ?

## 2023-05-20 MED ORDER — "BD DISP NEEDLES 21G X 1-1/2"" MISC": 1.0000 mg | 0 refills | Status: DC

## 2023-05-20 MED ORDER — BD DISP NEEDLES 18G X 1-1/2" MISC: 1.0000 mg | 0 refills | Status: DC

## 2023-05-24 ENCOUNTER — Encounter: Payer: Self-pay | Admitting: Ophthalmology

## 2023-05-31 ENCOUNTER — Encounter: Payer: Self-pay | Admitting: Ophthalmology

## 2023-06-02 NOTE — Discharge Instructions (Signed)

## 2023-06-07 ENCOUNTER — Ambulatory Visit
Admission: RE | Admit: 2023-06-07 | Discharge: 2023-06-07 | Disposition: A | Discharge status: Home / Self Care | Payer: Medicare Other | Attending: Ophthalmology | Admitting: Ophthalmology

## 2023-06-07 ENCOUNTER — Other Ambulatory Visit: Payer: Self-pay

## 2023-06-07 ENCOUNTER — Encounter: Payer: Self-pay | Admitting: Ophthalmology

## 2023-06-07 ENCOUNTER — Encounter
Admission: RE | Disposition: A | Discharge status: Home / Self Care | Payer: Self-pay | Source: Home / Self Care | Attending: Ophthalmology

## 2023-06-07 ENCOUNTER — Ambulatory Visit: Payer: Medicare Other | Admitting: Anesthesiology

## 2023-06-07 DIAGNOSIS — Z87891 Personal history of nicotine dependence: Secondary | ICD-10-CM | POA: Diagnosis not present

## 2023-06-07 DIAGNOSIS — H2512 Age-related nuclear cataract, left eye: Secondary | ICD-10-CM | POA: Diagnosis present

## 2023-06-07 HISTORY — DX: Personal history of other diseases of the musculoskeletal system and connective tissue: Z87.39

## 2023-06-07 HISTORY — DX: Spinal stenosis, lumbar region with neurogenic claudication: M48.062

## 2023-06-07 HISTORY — PX: CATARACT EXTRACTION W/PHACO: SHX586

## 2023-06-07 SURGERY — PHACOEMULSIFICATION, CATARACT, WITH IOL INSERTION
Anesthesia: Monitor Anesthesia Care | Site: Eye | Laterality: Left

## 2023-06-07 MED ORDER — MIDAZOLAM HCL 2 MG/2ML IJ SOLN
INTRAMUSCULAR | Status: DC | PRN
  Administered 2023-06-07: 1 mg via INTRAVENOUS

## 2023-06-07 MED ORDER — BRIMONIDINE TARTRATE-TIMOLOL 0.2-0.5 % OP SOLN
OPHTHALMIC | Status: DC | PRN
  Administered 2023-06-07: 1 [drp] via OPHTHALMIC

## 2023-06-07 MED ORDER — SODIUM CHLORIDE 0.9% FLUSH
INTRAVENOUS | Status: DC | PRN
  Administered 2023-06-07: 10 mL via INTRAVENOUS

## 2023-06-07 MED ORDER — SIGHTPATH DOSE#1 NA CHONDROIT SULF-NA HYALURON 40-17 MG/ML IO SOLN
INTRAOCULAR | Status: DC | PRN
  Administered 2023-06-07: 1 mL via INTRAOCULAR

## 2023-06-07 MED ORDER — MOXIFLOXACIN HCL 0.5 % OP SOLN
OPHTHALMIC | Status: DC | PRN
  Administered 2023-06-07: .2 mL via OPHTHALMIC

## 2023-06-07 MED ORDER — TETRACAINE HCL 0.5 % OP SOLN
1.0000 [drp] | OPHTHALMIC | Status: DC | PRN
  Administered 2023-06-07 (×3): 1 [drp] via OPHTHALMIC

## 2023-06-07 MED ORDER — SIGHTPATH DOSE#1 BSS IO SOLN
INTRAOCULAR | Status: DC | PRN
  Administered 2023-06-07: 15 mL via INTRAOCULAR

## 2023-06-07 MED ORDER — FENTANYL CITRATE (PF) 100 MCG/2ML IJ SOLN
INTRAMUSCULAR | Status: DC | PRN
  Administered 2023-06-07: 50 ug via INTRAVENOUS

## 2023-06-07 MED ORDER — LACTATED RINGERS IV SOLN: INTRAVENOUS | Status: DC

## 2023-06-07 MED ORDER — ARMC OPHTHALMIC DILATING DROPS
1.0000 | OPHTHALMIC | Status: DC | PRN
  Administered 2023-06-07 (×3): 1 via OPHTHALMIC

## 2023-06-07 MED ORDER — SIGHTPATH DOSE#1 BSS IO SOLN
INTRAOCULAR | Status: DC | PRN
  Administered 2023-06-07: 55 mL via OPHTHALMIC

## 2023-06-07 MED ORDER — SIGHTPATH DOSE#1 BSS IO SOLN
INTRAOCULAR | Status: DC | PRN
  Administered 2023-06-07: 2 mL

## 2023-06-07 SURGICAL SUPPLY — 12 items
ANGLE REVERSE CUT SHRT 25GA (CUTTER) ×1
CANNULA ANT/CHMB 27G (MISCELLANEOUS) IMPLANT
CANNULA ANT/CHMB 27GA (MISCELLANEOUS)
CATARACT SUITE SIGHTPATH (MISCELLANEOUS) ×1
CYSTOTOME ANGL RVRS SHRT 25G (CUTTER) ×1 IMPLANT
FEE CATARACT SUITE SIGHTPATH (MISCELLANEOUS) ×1 IMPLANT
GLOVE BIOGEL PI IND STRL 8 (GLOVE) ×1 IMPLANT
GLOVE SURG ENC TEXT LTX SZ8 (GLOVE) ×1 IMPLANT
LENS IOL TECNIS EYHANCE 19.0 (Intraocular Lens) IMPLANT
NDL FILTER BLUNT 18X1 1/2 (NEEDLE) ×1 IMPLANT
NEEDLE FILTER BLUNT 18X1 1/2 (NEEDLE) ×1
SYR 3ML LL SCALE MARK (SYRINGE) ×1 IMPLANT

## 2023-06-07 NOTE — H&P (Signed)
[facility]   Primary Care Physician:  Gracelyn Nurse, MD Ophthalmologist: Dr. Maren Reamer  Pre-Procedure History & Physical: HPI:  Donald [name] is a 72 y.o. male here for cataract surgery.   Past Medical History:  Diagnosis Date   Actinic keratosis    Arthritis    Basal cell carcinoma 02/23/2008   Left shoulder.    COVID-19 06/2020   ED (erectile dysfunction)    History of right foot drop    Hx of basal cell carcinoma 01/24/2017   Left lateral canthus. Nodular pattern. Tx: EDC   Hyperlipidemia    history of elevated cholesterol, better after pt started exercising.   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   Pre-diabetes     Past Surgical History:  Procedure Laterality Date   BACK SURGERY  1984   L4-5   CATARACT EXTRACTION W/PHACO Right 06/03/2015   Procedure: CATARACT EXTRACTION PHACO AND INTRAOCULAR LENS PLACEMENT (IOC);  Surgeon: Galen Manila, MD;  Location: [facility] ORS;  Service: Ophthalmology;  Laterality: Right;  [place]: 01:26.5AP%: 19.7CDE: 17.07Fluid lot# [id number]   COLONOSCOPY N/A 08/16/2016   Procedure: COLONOSCOPY;  Surgeon: Christena Deem, MD;  Location: [facility] ENDOSCOPY;  Service: Endoscopy;  Laterality: N/A;   COLONOSCOPY N/A 02/22/2022   Procedure: COLONOSCOPY;  Surgeon: Jaynie Collins, DO;  Location: [facility] ENDOSCOPY;  Service: Gastroenterology;  Laterality: N/A;   CYSTOSCOPY WITH INSERTION OF UROLIFT N/A 12/11/2020   Procedure: CYSTOSCOPY WITH INSERTION OF UROLIFT;  Surgeon: Orson Ape, MD;  Location: [facility] ORS;  Service: Urology;  Laterality: N/A;   EYE SURGERY     FRACTURE SURGERY Right 1976   ankle   HERNIA REPAIR Left 1972   nausea and  vomiting with general anesthesia   JOINT REPLACEMENT Left 2012   JOINT REPLACEMENT Right 2014   knees replaced     TOTAL HIP ARTHROPLASTY Right 12/08/2016   Procedure: TOTAL HIP ARTHROPLASTY;  Surgeon: Donato Heinz, MD;  Location: [facility] ORS;  Service: Orthopedics;  Laterality: Right;    Prior to Admission medications   Medication Sig Start Date End Date Taking? Authorizing Provider  aspirin 325 MG tablet Take 325 mg by mouth daily.   Yes [provider]  b complex vitamins capsule Take 1 capsule by mouth daily.   Yes [provider]  cyanocobalamin (VITAMIN B12) 500 MCG tablet Take 500 mcg by mouth daily.   Yes [provider]  DHEA 50 MG TABS Take by mouth.   Yes [provider]  losartan (COZAAR) 25 MG tablet Take 1 tablet by mouth daily. 09/22/22 09/22/23 Yes [provider]  Multiple Vitamin (MULTIVITAMIN WITH MINERALS) TABS tablet Take 1 tablet by mouth daily.   Yes [provider]  Probiotic Product (PROBIOTIC DAILY PO) Take by mouth.   Yes [provider]  sildenafil (REVATIO) 20 MG tablet 1 to 5 tablets as needed an hour prior to intercourse. 04/27/23  Yes Vanna Scotland, MD  tadalafil (CIALIS) 5 MG tablet Take 1 tablet (5 mg total) by mouth daily. 04/27/23  Yes Vanna Scotland, MD  testosterone cypionate (DEPOTESTOSTERONE CYPIONATE) 200 MG/ML injection Inject 0.5 mLs (100 mg total) into the muscle once a week. 04/29/23  Yes Vanna Scotland, MD  TURMERIC CURCUMIN PO Take 1 tablet by mouth daily. With coq 10   Yes [provider]  Zinc 30 MG TABS Take 30 mg by mouth daily.   Yes [provider]  albuterol (VENTOLIN HFA) 108 (90 Base) MCG/ACT inhaler Inhale 2 puffs into the lungs daily as needed. 09/22/22 12/21/22  [provider]  fluticasone (FLONASE) 50 MCG/ACT nasal spray Place 1 spray into both nostrils daily as needed for allergies. Patient not taking: Reported on 05/31/2023 10/26/16   [provider]  ketoconazole (NIZORAL) 2 % cream Apply 1 Application topically at bedtime. Qhs to feet 02/24/23 02/13/25  Deirdre Evener, MD  NEEDLE, DISP, 18 G (BD DISP NEEDLES) 18G X 1-1/2" MISC 1 mg by Does not apply route every 14 (fourteen) days. 05/20/23   Michiel Cowboy A, PA-C  NEEDLE, DISP, 21 G (BD DISP NEEDLES) 21G X 1-1/2" MISC 1 mg by Does not apply route every 14 (fourteen) days. 05/20/23   Michiel Cowboy A, PA-C  Polyethyl Glycol-Propyl Glycol (SYSTANE OP) Place 1 drop into both eyes daily as needed (dry eyes). Patient not taking: Reported on 05/31/2023    [provider]  Syringe, Disposable, (2-3CC SYRINGE) 3 ML MISC 1 mg by Does not apply route every 14 (fourteen) days. 05/20/23   Michiel Cowboy A, PA-C  Testosterone (AXIRON TD) Place 1 application onto the skin daily. Testosterone 200 mg cream Patient not taking: Reported on 05/31/2023    [provider]    Allergies as of 05/10/2023   (No Known Allergies)    History reviewed. No pertinent family history.  Social History   Socioeconomic History   Marital status: Married    Spouse name: Not on file   Number of children: Not on file   Years of education: Not on file   Highest education level: Not on file  Occupational History   Not on file  Tobacco Use   Smoking status: Never   Smokeless tobacco: Former    Types: Chew    Quit date: 03/2022  Vaping Use   Vaping status: Never Used  Substance and Sexual Activity   Alcohol use: Yes    Alcohol/week: 3.0 - 5.0 standard drinks of alcohol    Types: 1 - 2 Glasses of wine, 2 - 3 Cans of beer per week    Comment: OCC   Drug use: No    Comment: DELTA 8 OCC   Sexual activity: Not on file  Other Topics Concern   Not on file  Social History Narrative   Not on file   Social Determinants of Health   Financial Resource Strain: Not on file  Food Insecurity: Not on file  Transportation Needs: Not on file  Physical Activity: Not on file  Stress: Not  on file  Social Connections: Not on file  Intimate Partner Violence: Not on file    Review of Systems: See HPI, otherwise negative ROS  Physical Exam: BP (!) 140/77   Temp 98.1 F (36.7 C) (Temporal)   Resp 14   Ht 6' 0.01" (1.829 m)   Wt 115.2 kg   SpO2 97%   BMI 34.44 kg/m  General:   Alert, cooperative in NAD Head:  Normocephalic and atraumatic. Respiratory:  Normal work of breathing. Cardiovascular:  RRR  Impression/Plan: Donald [name] is here for cataract surgery.  Risks, benefits, limitations, and alternatives regarding cataract surgery have been reviewed with the patient.  Questions have been answered.  All parties agreeable.   Galen Manila, MD  06/07/2023, 8:41 AM

## 2023-06-07 NOTE — Anesthesia Postprocedure Evaluation (Signed)
Anesthesia Post Note  Patient: Donald [name]  Procedure(s) Performed: CATARACT EXTRACTION PHACO AND INTRAOCULAR LENS PLACEMENT (IOC) LEFT 6.32 00:40.9 (Left: Eye)  Patient location during evaluation: PACU Anesthesia Type: MAC Level of consciousness: awake and alert Pain management: pain level controlled Vital Signs Assessment: post-procedure vital signs reviewed and stable Respiratory status: spontaneous breathing, nonlabored ventilation, respiratory function stable and patient connected to nasal cannula oxygen Cardiovascular status: stable and blood pressure returned to baseline Postop Assessment: no apparent nausea or vomiting Anesthetic complications: no   No notable events documented.   Last Vitals:  Vitals:   06/07/23 0909 06/07/23 0912  BP: 130/83 133/74  Pulse: 69 70  Resp:  20  Temp: 36.8 C   SpO2: 98% 95%    Last Pain:  Vitals:   06/07/23 0912  TempSrc:   PainSc: 0-No pain                 Marisue Humble

## 2023-06-07 NOTE — Transfer of Care (Signed)
Immediate Anesthesia Transfer of Care Note  Patient: Donald [name]  Procedure(s) Performed: CATARACT EXTRACTION PHACO AND INTRAOCULAR LENS PLACEMENT (IOC) LEFT 6.32 00:40.9 (Left: Eye)  Patient Location: PACU  Anesthesia Type: MAC  Level of Consciousness: awake, alert  and patient cooperative  Airway and Oxygen Therapy: Patient Spontanous Breathing and Patient connected to supplemental oxygen  Post-op Assessment: Post-op Vital signs reviewed, Patient's Cardiovascular Status Stable, Respiratory Function Stable, Patent Airway and No signs of Nausea or vomiting  Post-op Vital Signs: Reviewed and stable  Complications: No notable events documented.

## 2023-06-07 NOTE — Op Note (Signed)
PREOPERATIVE DIAGNOSIS:  Nuclear sclerotic cataract of the left eye.   POSTOPERATIVE DIAGNOSIS:  Nuclear sclerotic cataract of the left eye.   OPERATIVE PROCEDURE:ORPROCALL@   SURGEON:  Galen Manila, MD.   ANESTHESIA:  Anesthesiologist: Marisue Humble, MD CRNA: Lynden Oxford, CRNA  1.      Managed anesthesia care. 2.     0.74ml of Shugarcaine was instilled following the paracentesis   COMPLICATIONS:  None.   TECHNIQUE:   Stop and chop   DESCRIPTION OF PROCEDURE:  The patient was examined and consented in the preoperative holding area where the aforementioned topical anesthesia was applied to the left eye and then brought back to the Operating Room where the left eye was prepped and draped in the usual sterile ophthalmic fashion and a lid speculum was placed. A paracentesis was created with the side port blade and the anterior chamber was filled with viscoelastic. A near clear corneal incision was performed with the steel keratome. A continuous curvilinear capsulorrhexis was performed with a cystotome followed by the capsulorrhexis forceps. Hydrodissection and hydrodelineation were carried out with BSS on a blunt cannula. The lens was removed in a stop and chop  technique and the remaining cortical material was removed with the irrigation-aspiration handpiece. The capsular bag was inflated with viscoelastic and the Technis [id number] lens was placed in the capsular bag without complication. The remaining viscoelastic was removed from the eye with the irrigation-aspiration handpiece. The wounds were hydrated. The anterior chamber was flushed with BSS and the eye was inflated to physiologic pressure. 0.2ml Vigamox was placed in the anterior chamber. The wounds were found to be water tight. The eye was dressed with Combigan. The patient was given protective glasses to wear throughout the day and a shield with which to sleep tonight. The patient was also given drops with which to begin a drop  regimen today and will follow-up with me in one day. Implant Name Type Inv. Item Serial No. Manufacturer Lot No. LRB No. Used Action  LENS IOL TECNIS EYHANCE 19.0 - [id number] Intraocular Lens LENS IOL TECNIS EYHANCE 19.0 [id number] [employer]  Left 1 Implanted    Procedure(s): CATARACT EXTRACTION PHACO AND INTRAOCULAR LENS PLACEMENT (IOC) LEFT 6.32 00:40.9 (Left)  Electronically signed: Galen Manila 06/07/2023 9:07 AM

## 2023-06-07 NOTE — Anesthesia Preprocedure Evaluation (Signed)
Anesthesia Evaluation  Patient identified by MRN, date of birth, ID band Patient awake    Reviewed: Allergy & Precautions, H&P , NPO status , Patient's Chart, lab work & pertinent test results  History of Anesthesia Complications (+) PONV and history of anesthetic complications  Airway Mallampati: III  TM Distance: >3 FB Neck ROM: Full    Dental no notable dental hx.    Pulmonary neg pulmonary ROS   Pulmonary exam normal breath sounds clear to auscultation       Cardiovascular negative cardio ROS Normal cardiovascular exam Rhythm:Regular Rate:Normal     Neuro/Psych negative neurological ROS  negative psych ROS   GI/Hepatic negative GI ROS, Neg liver ROS,,,  Endo/Other  negative endocrine ROS    Renal/GU negative Renal ROS  negative genitourinary   Musculoskeletal negative musculoskeletal ROS (+) Arthritis ,    Abdominal   Peds negative pediatric ROS (+)  Hematology negative hematology ROS (+)   Anesthesia Other Findings Arthritis  Hyperlipidemia Hx of basal cell carcinoma PONV (postoperative nausea and vomiting)  ED (erectile dysfunction) Lung nodule  Hx COVID-19 Melanoma (HCC)  Pre-diabetes Actinic keratosis  Lumbar stenosis with neurogenic claudication History of right foot drop      Reproductive/Obstetrics negative OB ROS                              Anesthesia Physical Anesthesia Plan  ASA: 3  Anesthesia Plan: MAC   Post-op Pain Management:    Induction: Intravenous  PONV Risk Score and Plan:   Airway Management Planned: Natural Airway and Nasal Cannula  Additional Equipment:   Intra-op Plan:   Post-operative Plan:   Informed Consent: I have reviewed the patients History and Physical, chart, labs and discussed the procedure including the risks, benefits and alternatives for the proposed anesthesia with the patient or authorized representative who has  indicated his/her understanding and acceptance.     Dental Advisory Given  Plan Discussed with: Anesthesiologist, CRNA and Surgeon  Anesthesia Plan Comments: (Patient consented for risks of anesthesia including but not limited to:  - adverse reactions to medications - damage to eyes, teeth, lips or other oral mucosa - nerve damage due to positioning  - sore throat or hoarseness - Damage to heart, brain, nerves, lungs, other parts of body or loss of life  Patient voiced understanding.)         Anesthesia Quick Evaluation

## 2023-06-08 ENCOUNTER — Encounter: Payer: Self-pay | Admitting: Ophthalmology

## 2023-06-28 ENCOUNTER — Other Ambulatory Visit: Payer: Medicare Other

## 2023-06-28 ENCOUNTER — Other Ambulatory Visit: Payer: Self-pay

## 2023-06-28 DIAGNOSIS — E291 Testicular hypofunction: Secondary | ICD-10-CM

## 2023-06-29 ENCOUNTER — Other Ambulatory Visit: Payer: Self-pay

## 2023-06-29 DIAGNOSIS — E291 Testicular hypofunction: Secondary | ICD-10-CM

## 2023-06-29 LAB — HEMOGLOBIN AND HEMATOCRIT, BLOOD
Hematocrit: 49.6 % (ref 37.5–51.0)
Hemoglobin: 16.2 g/dL (ref 13.0–17.7)

## 2023-06-29 LAB — TESTOSTERONE: Testosterone: 1121 ng/dL — ABNORMAL HIGH (ref 264–916)

## 2023-08-04 ENCOUNTER — Other Ambulatory Visit: Payer: Medicare Other

## 2023-08-04 DIAGNOSIS — E291 Testicular hypofunction: Secondary | ICD-10-CM

## 2023-08-05 LAB — TESTOSTERONE: Testosterone: 137 ng/dL — ABNORMAL LOW (ref 264–916)

## 2023-08-05 LAB — HEMOGLOBIN AND HEMATOCRIT, BLOOD
Hematocrit: 48.9 % (ref 37.5–51.0)
Hemoglobin: 16.1 g/dL (ref 13.0–17.7)

## 2023-09-01 ENCOUNTER — Encounter: Payer: Self-pay | Admitting: Dermatology

## 2023-09-01 ENCOUNTER — Ambulatory Visit: Payer: Medicare Other | Admitting: Dermatology

## 2023-09-01 DIAGNOSIS — Z872 Personal history of diseases of the skin and subcutaneous tissue: Secondary | ICD-10-CM

## 2023-09-01 DIAGNOSIS — L820 Inflamed seborrheic keratosis: Secondary | ICD-10-CM | POA: Diagnosis not present

## 2023-09-01 DIAGNOSIS — L814 Other melanin hyperpigmentation: Secondary | ICD-10-CM

## 2023-09-01 DIAGNOSIS — B078 Other viral warts: Secondary | ICD-10-CM

## 2023-09-01 DIAGNOSIS — D229 Melanocytic nevi, unspecified: Secondary | ICD-10-CM

## 2023-09-01 DIAGNOSIS — D1801 Hemangioma of skin and subcutaneous tissue: Secondary | ICD-10-CM

## 2023-09-01 DIAGNOSIS — L821 Other seborrheic keratosis: Secondary | ICD-10-CM

## 2023-09-01 DIAGNOSIS — W908XXA Exposure to other nonionizing radiation, initial encounter: Secondary | ICD-10-CM

## 2023-09-01 DIAGNOSIS — Z7189 Other specified counseling: Secondary | ICD-10-CM

## 2023-09-01 DIAGNOSIS — Z85820 Personal history of malignant melanoma of skin: Secondary | ICD-10-CM

## 2023-09-01 DIAGNOSIS — Z86006 Personal history of melanoma in-situ: Secondary | ICD-10-CM

## 2023-09-01 DIAGNOSIS — Z85828 Personal history of other malignant neoplasm of skin: Secondary | ICD-10-CM

## 2023-09-01 DIAGNOSIS — B353 Tinea pedis: Secondary | ICD-10-CM

## 2023-09-01 DIAGNOSIS — L578 Other skin changes due to chronic exposure to nonionizing radiation: Secondary | ICD-10-CM | POA: Diagnosis not present

## 2023-09-01 DIAGNOSIS — Z1283 Encounter for screening for malignant neoplasm of skin: Secondary | ICD-10-CM

## 2023-09-01 DIAGNOSIS — Z79899 Other long term (current) drug therapy: Secondary | ICD-10-CM

## 2023-09-01 MED ORDER — KETOCONAZOLE 2 % EX CREA: 1.0000 | TOPICAL_CREAM | Freq: Every day | CUTANEOUS | 11 refills | Status: AC

## 2023-09-01 NOTE — Progress Notes (Signed)
Follow-Up Visit   Subjective  Donald [name] is a 72 y.o. male who presents for the following: Skin Cancer Screening and Full Body Skin Exam. HxMIS, HxBCC, HxAKs.   Spots on left hand, right calf, right thumb.  The patient presents for Total-Body Skin Exam (TBSE) for skin cancer screening and mole check. The patient has spots, moles and lesions to be evaluated, some may be new or changing and the patient may have concern these could be cancer.    The following portions of the chart were reviewed this encounter and updated as appropriate: medications, allergies, medical history  Review of Systems:  No other skin or systemic complaints except as noted in HPI or Assessment and Plan.  Objective  Well appearing patient in no apparent distress; mood and affect are within normal limits.  A full examination was performed including scalp, head, eyes, ears, nose, lips, neck, chest, axillae, abdomen, back, buttocks, bilateral upper extremities, bilateral lower extremities, hands, feet, fingers, toes, fingernails, and toenails. All findings within normal limits unless otherwise noted below.   Relevant physical exam findings are noted in the Assessment and Plan.  Right Thumb x1 Verrucous papule -- Discussed viral etiology and contagion.   Right calf x1, left dorsal hand x1, left upper chest x1 (3) Erythematous keratotic or waxy stuck-on papule or plaque.    Assessment & Plan   HISTORY OF MELANOMA IN SITU. 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  - Call if any new or changing lesions are noted between office visits    HISTORY OF BASAL CELL CARCINOMA OF THE SKIN - No evidence of  recurrence today - Recommend regular full body skin exams - Recommend daily broad spectrum sunscreen SPF 30+ to sun-exposed areas, reapply every 2 hours as needed.  - Call if any new or changing lesions are noted between office visits   SKIN CANCER SCREENING PERFORMED TODAY.  ACTINIC DAMAGE - Chronic condition, secondary to cumulative UV/sun exposure - diffuse scaly erythematous macules with underlying dyspigmentation - Recommend daily broad spectrum sunscreen SPF 30+ to sun-exposed areas, reapply every 2 hours as needed.  - Staying in the shade or wearing long sleeves, sun glasses (UVA+UVB protection) and wide brim hats (4-inch brim around the entire circumference of the hat) are also recommended for sun protection.  - Call for new or changing lesions.  LENTIGINES, SEBORRHEIC KERATOSES, HEMANGIOMAS - Benign normal skin lesions - Benign-appearing - Call for any changes  MELANOCYTIC NEVI - Tan-brown and/or pink-flesh-colored symmetric macules and papules - Benign appearing on exam today - Observation - Call clinic for new or changing moles - Recommend daily use of broad spectrum spf 30+ sunscreen to sun-exposed areas.   TINEA PEDIS Exam: Scaling and maceration web spaces and over distal and lateral soles. Chronic and persistent condition with duration or expected duration over one year. Condition is symptomatic / bothersome to patient. Not to goal.  Treatment Plan: Continue Ketoconazole 2% cream to feet every night at bedtime.   Other viral warts Right Thumb x1  Viral Wart (HPV) Counseling  Discussed viral / HPV (Human Papilloma Virus) etiology and risk of spread /infectivity to other areas of body as well as to other people.  Multiple  treatments and methods may be required to clear warts and it is possible treatment may not be successful.  Treatment risks include discoloration; scarring and there is still potential for wart recurrence.  Destruction of lesion - Right Thumb  x1 Complexity: simple   Destruction method: cryotherapy   Informed consent: discussed and consent obtained   Timeout:  patient name, date of birth, surgical site, and procedure verified Lesion destroyed using liquid nitrogen: Yes   Region frozen until ice ball extended beyond lesion: Yes   Outcome: patient tolerated procedure well with no complications   Post-procedure details: wound care instructions given    Inflamed seborrheic keratosis (3) Right calf x1, left dorsal hand x1, left upper chest x1  Symptomatic, irritating, patient would like treated.  Destruction of lesion - Right calf x1, left dorsal hand x1, left upper chest x1 (3) Complexity: simple   Destruction method: cryotherapy   Informed consent: discussed and consent obtained   Timeout:  patient name, date of birth, surgical site, and procedure verified Lesion destroyed using liquid nitrogen: Yes   Region frozen until ice ball extended beyond lesion: Yes   Outcome: patient tolerated procedure well with no complications   Post-procedure details: wound care instructions given   Additional details:  Prior to procedure, discussed risks of blister formation, small wound, skin dyspigmentation, or rare scar following cryotherapy. Recommend Vaseline ointment to treated areas while healing.    Return in about 6 months (around 02/29/2024) for TBSE, HxMIS, HxBCC.  I, Lawson Radar, CMA, am acting as scribe for [name], MD.   Documentation: I have reviewed the above documentation for accuracy and completeness, and I agree with the above.  [name], MD

## 2023-09-01 NOTE — Patient Instructions (Signed)

## 2023-09-02 ENCOUNTER — Other Ambulatory Visit: Payer: Self-pay | Admitting: Medical Genetics

## 2023-09-02 DIAGNOSIS — Z006 Encounter for examination for normal comparison and control in clinical research program: Secondary | ICD-10-CM

## 2023-09-03 ENCOUNTER — Other Ambulatory Visit
Admission: RE | Admit: 2023-09-03 | Discharge: 2023-09-03 | Disposition: A | Discharge status: Home / Self Care | Payer: Medicare Other | Source: Ambulatory Visit | Attending: Medical Genetics | Admitting: Medical Genetics

## 2023-09-03 DIAGNOSIS — Z006 Encounter for examination for normal comparison and control in clinical research program: Secondary | ICD-10-CM | POA: Insufficient documentation

## 2023-09-14 LAB — HELIX MOLECULAR SCREEN: Genetic Analysis Overall Interpretation: NEGATIVE

## 2023-09-14 LAB — GENECONNECT MOLECULAR SCREEN

## 2023-09-16 NOTE — Progress Notes (Unsigned)
09/20/2023 8:55 AM   Donald [name] [birth year] [id number]  Referring provider: Gracelyn Nurse, MD [address] [facility] [place],  [place] [address]  Urological history: 1.  Hypogonadism -Contributing factors of age, obesity, alcohol consumption and hyperglycemia -Testosterone level pending -Hemoglobin/hematocrit (08/2023) 16.1/48.6 -Testosterone cypionate 200 mg/milliliters, 1 cc every 14 days  2. 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 Erectile dysfunction -Contributing factors of age, BPH, hypogonadism, obesity, alcohol consumption, hyperglycemia and lumbar stenosis -Tadalafil 5 mg daily -Sildenafil 20 mg, 1 to 5 tablets on demand dosing as needed for intercourse  No chief complaint on file.  HPI: Donald [name] is a 72 y.o. male who presents today for follow up.   Previous records reviewed.   I PSS ***    Score:  1-7 Mild 8-19 Moderate 20-35 Severe   SHIM ***    Score: 1-7 Severe ED 8-11 Moderate ED 12-16 Mild-Moderate ED 17-21 Mild ED 22-25 No ED   PMH: Past Medical History:  Diagnosis Date   Actinic keratosis    Arthritis    Basal cell carcinoma 02/23/2008   Left shoulder.    COVID-19 06/2020   ED (erectile dysfunction)    History of right foot drop    Hx of basal cell carcinoma 01/24/2017   Left lateral canthus. Nodular pattern. Tx: EDC   Hyperlipidemia    history of elevated cholesterol, better after pt started exercising.   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   Pre-diabetes     Surgical History: Past Surgical History:  Procedure Laterality Date   BACK SURGERY  1984   L4-5   CATARACT EXTRACTION  W/PHACO Right 06/03/2015   Procedure: CATARACT EXTRACTION PHACO AND INTRAOCULAR LENS PLACEMENT (IOC);  Surgeon: Galen Manila, MD;  Location: [facility];  Service: Ophthalmology;  Laterality: Right;  [place]: 01:26.5AP%: 19.7CDE: 17.07Fluid lot# [id number]   CATARACT EXTRACTION W/PHACO Left 06/07/2023   Procedure: CATARACT EXTRACTION PHACO AND INTRAOCULAR LENS PLACEMENT (IOC) LEFT 6.32 00:40.9;  Surgeon: Galen Manila, MD;  Location: [facility];  Service: Ophthalmology;  Laterality: Left;   COLONOSCOPY N/A 08/16/2016   Procedure: COLONOSCOPY;  Surgeon: Christena Deem, MD;  Location: [facility];  Service: Endoscopy;  Laterality: N/A;   COLONOSCOPY N/A 02/22/2022   Procedure: COLONOSCOPY;  Surgeon: Jaynie Collins, DO;  Location: [facility] END[facility]COPY;  Service: Gastroenterology;  Laterality: N/A;   CYSTOSCOPY WITH INSERTION OF UROLIFT N/A 12/11/2020   Procedure: CYSTOSCOPY WITH INSERTION OF UROLIFT;  Surgeon: Orson Ape, MD;  Location: [facility];  Service: Urology;  Laterality: N/A;   EYE SURGERY     FRACTURE SURGERY Right 1976   ankle   HERNIA REPAIR Left 1972   nausea and vomiting with general anesthesia   JOINT REPLACEMENT Left 2012   JOINT REPLACEMENT Right 2014   knees replaced     TOTAL HIP ARTHROPLASTY Right 12/08/2016   Procedure: TOTAL HIP ARTHROPLASTY;  Surgeon: Donato Heinz, MD;  Location: [facility];  Service: Orthopedics;  Laterality: Right;    Home Medications:  Allergies as of 09/20/2023   No Known Allergies      Medication List  Accurate as of September 16, 2023  8:55 AM. If you have any questions, ask your nurse or doctor.          2-3CC SYRINGE 3 ML Misc 1 mg by Does not apply route every 14 (fourteen) days.   albuterol 108 (90 Base) MCG/ACT inhaler Commonly known as: VENTOLIN HFA Inhale 2 puffs into the lungs daily as needed.   aspirin 325 MG tablet Take 325 mg by mouth daily.   AXIRON TD Place 1 application onto the skin daily.  Testosterone 200 mg cream   b complex vitamins capsule Take 1 capsule by mouth daily.   BD Disp Needles 18G X 1-1/2" Misc Generic drug: NEEDLE (DISP) 18 G 1 mg by Does not apply route every 14 (fourteen) days.   BD Disp Needles 21G X 1-1/2" Misc Generic drug: NEEDLE (DISP) 21 G 1 mg by Does not apply route every 14 (fourteen) days.   cyanocobalamin 500 MCG tablet Commonly known as: VITAMIN B12 Take 500 mcg by mouth daily.   DHEA 50 MG Tabs Take by mouth.   fluticasone 50 MCG/ACT nasal spray Commonly known as: FLONASE Place 1 spray into both nostrils daily as needed for allergies.   ketoconazole 2 % cream Commonly known as: NIZORAL Apply 1 Application topically at bedtime. Qhs to feet   losartan 25 MG tablet Commonly known as: COZAAR Take 1 tablet by mouth daily.   multivitamin with minerals Tabs tablet Take 1 tablet by mouth daily.   PROBIOTIC DAILY PO Take by mouth.   sildenafil 20 MG tablet Commonly known as: REVATIO 1 to 5 tablets as needed an hour prior to intercourse.   SYSTANE OP Place 1 drop into both eyes daily as needed (dry eyes).   tadalafil 5 MG tablet Commonly known as: CIALIS Take 1 tablet (5 mg total) by mouth daily.   testosterone cypionate 200 MG/ML injection Commonly known as: DEPOTESTOSTERONE CYPIONATE Inject 0.5 mLs (100 mg total) into the muscle once a week.   TURMERIC CURCUMIN PO Take 1 tablet by mouth daily. With coq 10   Zinc 30 MG Tabs Take 30 mg by mouth daily.        Allergies: No Known Allergies  Family History: No family history on file.  Social History:  reports that he has never smoked. He quit smokeless tobacco use about 17 months ago.  His smokeless tobacco use included chew. He reports current alcohol use of about 3.0 - 5.0 standard drinks of alcohol per week. He reports that he does not use drugs.  ROS: Pertinent ROS in HPI  Physical Exam: There were no vitals taken for this visit.  Constitutional:  Well  nourished. Alert and oriented, No acute distress. HEENT: [place] AT, moist mucus membranes.  Trachea midline, no masses. Cardiovascular: No clubbing, cyanosis, or edema. Respiratory: Normal respiratory effort, no increased work of breathing. GI: Abdomen is soft, non tender, non distended, no abdominal masses. Liver and spleen not palpable.  No hernias appreciated.  Stool sample for occult testing is not indicated.   GU: No CVA tenderness.  No bladder fullness or masses.  Patient with circumcised/uncircumcised phallus. ***Foreskin easily retracted***  Urethral meatus is patent.  No penile discharge. No penile lesions or rashes. Scrotum without lesions, cysts, rashes and/or edema.  Testicles are located scrotally bilaterally. No masses are appreciated in the testicles. Left and right epididymis are normal. Rectal: Patient with  normal sphincter tone. Anus and perineum without scarring or rashes. No rectal masses are appreciated. Prostate  is approximately *** grams, *** nodules are appreciated. Seminal vesicles are normal. Skin: No rashes, bruises or suspicious lesions. Lymph: No cervical or inguinal adenopathy. Neurologic: Grossly intact, no focal deficits, moving all 4 extremities. Psychiatric: Normal mood and affect.  Laboratory Data: Comprehensive Metabolic Panel (CMP) Order: [id number] Component Ref Range & Units 4 d ago  Glucose 70 - 110 mg/dL 409 High   Sodium 811 - 145 mmol/L 143  Potassium 3.6 - 5.1 mmol/L 4.9  Chloride 97 - 109 mmol/L 110 High   Carbon Dioxide (CO2) 22.0 - 32.0 mmol/L 26.1  Urea Nitrogen (BUN) 7 - 25 mg/dL 15  Creatinine 0.7 - 1.3 mg/dL 1  Glomerular Filtration Rate (eGFR) >60 mL/min/1.73sq m 79  Comment: CKD-EPI (2021) does not include patient's race in the calculation of eGFR.  Monitoring changes of plasma creatinine and eGFR over time is useful for monitoring kidney function.  Interpretive Ranges for eGFR (CKD-EPI 2021):  eGFR:       >60 mL/min/1.73 sq. m -  Normal eGFR:       30-59 mL/min/1.73 sq. m - Moderately Decreased eGFR:       15-29 mL/min/1.73 sq. m  - Severely Decreased eGFR:       < 15 mL/min/1.73 sq. m  - Kidney Failure   Note: These eGFR calculations do not apply in acute situations when eGFR is changing rapidly or patients on dialysis.  Calcium 8.7 - 10.3 mg/dL 8.9  AST 8 - 39 U/L 17  ALT 6 - 57 U/L 19  Alk Phos (alkaline Phosphatase) 34 - 104 U/L 87  Albumin 3.5 - 4.8 g/dL 4.1  Bilirubin, Total 0.3 - 1.2 mg/dL 0.6  Protein, Total 6.1 - 7.9 g/dL 6.3  A/G Ratio 1.0 - 5.0 gm/dL 1.9  Resulting Agency [facility]   Specimen Collected: 09/12/23 07:34   Performed by: [facility] Last Resulted: 09/12/23 14:57  Received From: [facility]  Result Received: 09/16/23 08:55    CBC w/auto Differential (5 Part) Order: [id number] Component Ref Range & Units 4 d ago  WBC (White Blood Cell Count) 4.1 - 10.2 10^3/uL 7  RBC (Red Blood Cell Count) 4.69 - 6.13 10^6/uL 5.58  Hemoglobin 14.1 - 18.1 gm/dL 21.3  Hematocrit 08.6 - 52.0 % 48.6  MCV (Mean Corpuscular Volume) 80.0 - 100.0 fl 87.1  MCH (Mean Corpuscular Hemoglobin) 27.0 - 31.2 pg 28.9  MCHC (Mean Corpuscular Hemoglobin Concentration) 32.0 - 36.0 gm/dL 57.8  Platelet Count 469 - 450 10^3/uL 278  RDW-CV (Red Cell Distribution Width) 11.6 - 14.8 % 15.2 High   MPV (Mean Platelet Volume) 9.4 - 12.4 fl 9.2 Low   Neutrophils 1.50 - 7.80 10^3/uL 4.34  Lymphocytes 1.00 - 3.60 10^3/uL 1.46  Monocytes 0.00 - 1.50 10^3/uL 0.84  Eosinophils 0.00 - 0.55 10^3/uL 0.25  Basophils 0.00 - 0.09 10^3/uL 0.04  Neutrophil % 32.0 - 70.0 % 62.4  Lymphocyte % 10.0 - 50.0 % 21  Monocyte % 4.0 - 13.0 % 12.1  Eosinophil % 1.0 - 5.0 % 3.6  Basophil% 0.0 - 2.0 % 0.6  Immature Granulocyte % <=0.7 % 0.3  Immature Granulocyte Count <=0.06 10^3/L 0.02  Resulting Agency [facility]   Specimen Collected: 09/12/23  07:34   Performed by: [facility] Last Resulted: 09/12/23 08:20  Received From: [facility]  Result Received: 09/16/23 08:55    Lipid Panel w/calc LDL Order: [id number] Component Ref Range &  Units 4 d ago  Cholesterol, Total 100 - 200 mg/dL 119  Triglyceride 35 - 199 mg/dL 147  HDL (High Density Lipoprotein) Cholesterol 29.0 - 71.0 mg/dL 82.9  LDL Calculated 0 - 130 mg/dL 562  VLDL Cholesterol mg/dL 28  Cholesterol/HDL Ratio 4.7  Resulting Agency [facility]   Specimen Collected: 09/12/23 07:34   Performed by: [facility] Last Resulted: 09/12/23 15:52  Received From: [facility]  Result Received: 09/16/23 08:55   Thyroid Stimulating-Hormone (TSH) Order: [id number] Component Ref Range & Units 4 d ago  Thyroid Stimulating Hormone (TSH) 0.450-5.330 uIU/ml uIU/mL 2.645  Resulting Agency [facility] - [facility]   Specimen Collected: 09/12/23 07:34   Performed by: [facility] Last Resulted: 09/12/23 11:11  Received From: [facility]  Result Received: 09/16/23 08:55   PSA, Total (Screen) Order: [id number] Component Ref Range & Units 4 d ago  PSA (Prostate Specific Antigen), Total 0.10 - 4.00 ng/mL 1.3  Resulting Agency [facility]  Narrative Performed by [facility] - [facility] Test results were determined with Beckman Coulter Hybritech Assay. Values obtained with different assay methods cannot be used interchangeably in serial testing. Assay results should not be interpreted as absolute evidence of the presence or absence of malignant disease  Specimen Collected: 09/12/23 07:34   Performed by: [facility] Last Resulted: 09/12/23 11:11  Received From: [facility]  Result Received: 09/16/23 08:55   Hemoglobin A1C Order: [id number] Component Ref Range & Units 4 d ago  Hemoglobin A1C 4.2 - 5.6 % 6 High    Average Blood Glucose (Calc) mg/dL 725  Resulting Agency [facility]  Narrative Performed by [facility] Normal Range:    4.2 - 5.6% Increased Risk:  5.7 - 6.4% Diabetes:        >= 6.5% Glycemic Control for adults with diabetes:  <7%    Specimen Collected: 09/12/23 07:34   Performed by: [facility] Last Resulted: 09/12/23 10:38  Received From: [facility]  Result Received: 09/16/23 08:55   Urinalysis Urinalysis w/Microscopic Order: [id number] Component Ref Range & Units 4 d ago  Color Colorless, Straw, Light Yellow, Yellow, Dark Yellow Light Yellow  Clarity Clear Clear  Specific Gravity 1.005 - 1.030 1.013  pH, Urine 5.0 - 8.0 6  Protein, Urinalysis Negative mg/dL Negative  Glucose, Urinalysis Negative mg/dL Negative  Ketones, Urinalysis Negative mg/dL Negative  Blood, Urinalysis Negative Negative  Nitrite, Urinalysis Negative Negative  Leukocyte Esterase, Urinalysis Negative Negative  Bilirubin, Urinalysis Negative Negative  Urobilinogen, Urinalysis 0.2 - 1.0 mg/dL 0.2  WBC, UA <=5 /hpf <1  Red Blood Cells, Urinalysis <=3 /hpf 0  Bacteria, Urinalysis 0 - 5 /hpf 0-5  Squamous Epithelial Cells, Urinalysis /hpf 0  Resulting Agency [facility]   Specimen Collected: 09/12/23 07:34   Performed by: [facility] Last Resulted: 09/12/23 08:41  Received From: [facility]  Result Received: 09/16/23 08:55  I have reviewed the labs.   Pertinent Imaging: N/A  Assessment & Plan:  ***  1. Testosterone deficiency  -testosterone levels are pending -H & H normal -continue testosterone cypionate 200 mg/milliliters, 1 cc every 14 days until testosterone results are available and will adjust if necessary  2. BPH with LUTS -PSA stable -DRE benign -UA benign -PVR < 300 cc -symptoms - *** -  most bothersome symptoms are *** -continue conservative management,  avoiding bladder irritants and timed voiding's -Initiate alpha-blocker (***), discussed side effects *** -Initiate 5 alpha reductase inhibitor (***), discussed side effects *** -Continue tamsulosin 0.4 mg daily, alfuzosin 10 mg daily, Rapaflo 8 mg daily, terazosin, doxazosin, Cialis 5 mg daily and finasteride 5 mg daily, dutasteride 0.5 mg daily***:refills given -Cannot tolerate medication or medication failure, schedule cystoscopy ***  3. Erectile dysfunction:    - I explained that conditions like diabetes, hypertension, coronary artery disease, peripheral vascular disease, smoking, alcohol consumption, age, sleep apnea and BPH can diminish the ability to have an erection - I explained the ED may be a risk marker for underlying CVD and he should follow up with PCP for further studies *** - A recent study published in Sex Med 2018 Apr 13 revealed moderate to vigorous aerobic exercise for 40 minutes 4 times per week can decrease erectile problems caused by physical inactivity, obesity, hypertension, metabolic syndrome and/or cardiovascular diseases *** - We discussed trying a *** different PDE5 inhibitor, intra-urethral suppositories, intracavernous vasoactive drug injection therapy, vacuum erection devices and penile prosthesis implantation       No follow-ups on file.  These notes generated with voice recognition software. I apologize for typographical errors.  Cloretta Ned  [facility] [address]  [address] [place], [place] [address] [phone]

## 2023-09-20 ENCOUNTER — Ambulatory Visit: Payer: Medicare Other | Admitting: Urology

## 2023-09-20 ENCOUNTER — Encounter: Payer: Self-pay | Admitting: Urology

## 2023-09-20 VITALS — BP 152/82 | HR 72 | Ht 72.0 in | Wt 262.0 lb

## 2023-09-20 DIAGNOSIS — R350 Frequency of micturition: Secondary | ICD-10-CM | POA: Diagnosis not present

## 2023-09-20 DIAGNOSIS — E291 Testicular hypofunction: Secondary | ICD-10-CM

## 2023-09-20 DIAGNOSIS — N401 Enlarged prostate with lower urinary tract symptoms: Secondary | ICD-10-CM

## 2023-09-20 DIAGNOSIS — N529 Male erectile dysfunction, unspecified: Secondary | ICD-10-CM

## 2023-09-20 MED ORDER — TESTOSTERONE CYPIONATE 200 MG/ML IM SOLN
100.0000 mg | INTRAMUSCULAR | 0 refills | Status: DC
Start: 2023-09-20 — End: 2024-01-13

## 2023-09-21 LAB — TESTOSTERONE: Testosterone: 646 ng/dL (ref 264–916)

## 2023-11-11 NOTE — Progress Notes (Unsigned)
Referring Physician:  No referring provider defined for this encounter.  Primary Physician:  Gracelyn Nurse, MD  History of Present Illness: He has a history of obesity, CAD, HTN, hyperlipidemia, prediabetes, and reactive airway disease.   Last seen by Dr. Myer Haff on 11/25/22 for follow up. History of lumbar surgery years ago, ?lumbar decompression.   He has known diffuse lumbar spondylosis with significant stenosis at L3-L4 as well as moderate bilateral facet arthropathy at L3-L4 and L4-L5.   He had improving right foot drop and previous EMG showed peripheral nerve dysfunction.   No surgery recommended as he was doing well.   He has been seeing PMR and having injections. He is here for follow up.   He has intermittent LBP with left lateral thigh pain to his knee and posterior thigh pain in right. He has tightness in his lower back.  He had bilateral L4-5 transforaminal ESI on 10/31/23 that helped his left sided back and leg. Right leg pain has been worse since the injection. No numbness or tingling. He thinks weakness in right foot is better. No weakness in left leg.   He is doing stretches at [employer] twice a week with some improvement. [facility] and [facility].   No bowel or bladder issues.   He does not smoke. He uses nicotine pouches rarely.   Conservative measures:  Physical therapy: dry needling and acupuncture with chiropractor. Did 2 visits PT in March 2023.  Multimodal medical therapy including regular antiinflammatories: aleve  Injections:  10/31/2023: Bilateral L4-5 transforaminal ESI (dexamethasone 13 mg) 12/01/2022: Bilateral L4-5 transforaminal ESI (70% relief,dexamethasone 13 mg)  Left L4-L5 TF ESI 08/06/22 (Chasnis) Left L4-L5 TF ESI 03/09/22 (Chasnis) Left L4-L5 TF ESI 05/03/22 (Chasnis)  Past Surgery: ? Decompressive lumbar surgery in 1984 with Dr. Thedore Mins has no symptoms of cervical  myelopathy.  The symptoms are causing a significant impact on the patient's life.   Review of Systems:  A 10 point review of systems is negative, except for the pertinent positives and negatives detailed in the HPI.  Past Medical History: Past Medical History:  Diagnosis Date   Actinic keratosis    Arthritis    Basal cell carcinoma 02/23/2008   Left shoulder.    COVID-19 06/2020   ED (erectile dysfunction)    History of right foot drop    Hx of basal cell carcinoma 01/24/2017   Left lateral canthus. Nodular pattern. Tx: EDC   Hyperlipidemia    history of elevated cholesterol, better after pt started exercising.   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   Pre-diabetes     Past Surgical History: Past Surgical History:  Procedure Laterality Date   BACK SURGERY  1984   L4-5   CATARACT EXTRACTION W/PHACO Right 06/03/2015   Procedure: CATARACT EXTRACTION PHACO AND INTRAOCULAR LENS PLACEMENT (IOC);  Surgeon: Galen Manila, MD;  Location: [facility] ORS;  Service: Ophthalmology;  Laterality: Right;  [place]: 01:26.5AP%: 19.7CDE: 17.07Fluid lot# [id number]   CATARACT EXTRACTION W/PHACO Left 06/07/2023   Procedure: CATARACT EXTRACTION PHACO AND INTRAOCULAR LENS PLACEMENT (IOC) LEFT 6.32 00:40.9;  Surgeon: Druscilla Brownie,  Chrissie Noa, MD;  Location: [facility];  Service: Ophthalmology;  Laterality: Left;   COLONOSCOPY N/A 08/16/2016   Procedure: COLONOSCOPY;  Surgeon: Christena Deem, MD;  Location: [facility] ENDOSCOPY;  Service: Endoscopy;  Laterality: N/A;   COLONOSCOPY N/A 02/22/2022   Procedure: COLONOSCOPY;  Surgeon: Jaynie Collins, DO;  Location[facility] END[facility]COPY;  Service: Gastroenterology;   Laterality: N/A;   CYSTOSCOPY WITH INSERTION OF UROLIFT N/A 12/11/2020   Procedure: CYSTOSCOPY WITH INSERTION OF UROLIFT;  Surgeon: Orson Ape, MD;  Location: [facility] ORS;  Service: Urology;  Laterality: N/A;   EYE SURGERY     FRACTURE SURGERY Right 1976   ankle   HERNIA REPAIR Left 1972   nausea and vomiting with general anesthesia   JOINT REPLACEMENT Left 2012   JOINT REPLACEMENT Right 2014   knees replaced     TOTAL HIP ARTHROPLASTY Right 12/08/2016   Procedure: TOTAL HIP ARTHROPLASTY;  Surgeon: Donato Heinz, MD;  Location: [facility] ORS;  Service: Orthopedics;  Laterality: Right;    Allergies: Allergies as of 11/14/2023   (No Known Allergies)    Medications: Outpatient Encounter Medications as of 11/14/2023  Medication Sig   albuterol (VENTOLIN HFA) 108 (90 Base) MCG/ACT inhaler Inhale 2 puffs into the lungs daily as needed.   aspirin 325 MG tablet Take 325 mg by mouth daily.   b complex vitamins capsule Take 1 capsule by mouth daily.   cyanocobalamin (VITAMIN B12) 500 MCG tablet Take 500 mcg by mouth daily.   DHEA 50 MG TABS Take by mouth.   fluticasone (FLONASE) 50 MCG/ACT nasal spray Place 1 spray into both nostrils daily as needed for allergies.   ketoconazole (NIZORAL) 2 % cream Apply 1 Application topically at bedtime. Qhs to feet   losartan (COZAAR) 25 MG tablet Take 1 tablet by mouth daily.   Multiple Vitamin (MULTIVITAMIN WITH MINERALS) TABS tablet Take 1 tablet by mouth daily.   NEEDLE, DISP, 18 G (BD DISP NEEDLES) 18G X 1-1/2" MISC 1 mg by Does not apply route every 14 (fourteen) days.   NEEDLE, DISP, 21 G (BD DISP NEEDLES) 21G X 1-1/2" MISC 1 mg by Does not apply route every 14 (fourteen) days.   Polyethyl Glycol-Propyl Glycol (SYSTANE OP) Place 1 drop into both eyes daily as needed (dry eyes).   Probiotic Product (PROBIOTIC DAILY PO) Take by mouth.   sildenafil (REVATIO) 20 MG tablet 1 to 5 tablets as needed an hour prior to intercourse.   Syringe, Disposable,  (2-3CC SYRINGE) 3 ML MISC 1 mg by Does not apply route every 14 (fourteen) days.   tadalafil (CIALIS) 5 MG tablet Take 1 tablet (5 mg total) by mouth daily.   testosterone cypionate (DEPOTESTOSTERONE CYPIONATE) 200 MG/ML injection Inject 0.5 mLs (100 mg total) into the muscle once a week.   TURMERIC CURCUMIN PO Take 1 tablet by mouth daily. With coq 10   Zinc 30 MG TABS Take 30 mg by mouth daily.   No facility-administered encounter medications on file as of 11/14/2023.    Social History: Social History   Tobacco Use   Smoking status: Never   Smokeless tobacco: Former    Types: Chew    Quit date: 03/2022  Vaping Use   Vaping status: Never Used  Substance Use Topics   Alcohol use: Yes    Alcohol/week: 3.0 - 5.0 standard drinks of alcohol    Types: 1 - 2 Glasses of wine, 2 - 3 Cans of beer per week    Comment:  OCC   Drug use: No    Comment: DELTA 8 OCC    Family Medical History: No family history on file.  Physical Examination: There were no vitals filed for this visit.    Awake, alert, oriented to person, place, and time.  Speech is clear and fluent. Fund of knowledge is appropriate.   Cranial Nerves: Pupils equal round and reactive to light.  Facial tone is symmetric.    Strength:  Side Iliopsoas Quads Hamstring PF DF EHL  R 5 5 5 5 4 4   L 5 5 5 5 5 5    Bilateral lower extremity sensation is intact to light touch.     He has limited ROM of right ankle.   He cannot heel stand on right foot. He can toe stand on right and left foot independently.   Medical Decision Making  Imaging: none   Assessment and Plan: Mr. Gasque has intermittent LBP with left lateral thigh pain to his knee and posterior thigh pain in right. He has tightness in his lower back. He thinks weakness in right foot is better. No weakness in left leg.   History of lumbar surgery years ago, ?lumbar decompression.   He has known diffuse lumbar spondylosis with significant stenosis at L3-L4 as  well as moderate bilateral facet arthropathy at L3-L4 and L4-L5.   He had improving right foot drop and previous EMG showed peripheral nerve dysfunction.   Treatment options discussed with patient and following plan made:   - PT for lumbar spine. Message to [facility] to do orders at [facility].  - He will follow up with Whitney as scheduled to discuss further injections.  - If no improvement with PT, he may be surgical candidate. Would need updated lumbar MRI (last was 08/24/22) and then follow up with Dr. Myer Haff.   I spent a total of 20 minutes in face-to-face and non-face-to-face activities related to this patient's care toda including review of outside records, review of imaging, review of symptoms, physical exam, discussion of differential diagnosis, discussion of treatment options, and documentation.   Drake Leach PA-C Dept. of Neurosurgery

## 2023-11-14 ENCOUNTER — Ambulatory Visit: Payer: Medicare Other | Admitting: Orthopedic Surgery

## 2023-11-14 ENCOUNTER — Encounter: Payer: Self-pay | Admitting: Orthopedic Surgery

## 2023-11-14 VITALS — BP 136/84 | Ht 72.0 in | Wt 262.0 lb

## 2023-11-14 DIAGNOSIS — M21371 Foot drop, right foot: Secondary | ICD-10-CM

## 2023-11-14 DIAGNOSIS — M5416 Radiculopathy, lumbar region: Secondary | ICD-10-CM

## 2023-11-14 DIAGNOSIS — M48061 Spinal stenosis, lumbar region without neurogenic claudication: Secondary | ICD-10-CM

## 2023-11-14 DIAGNOSIS — M4726 Other spondylosis with radiculopathy, lumbar region: Secondary | ICD-10-CM

## 2023-11-14 DIAGNOSIS — M47816 Spondylosis without myelopathy or radiculopathy, lumbar region: Secondary | ICD-10-CM

## 2023-11-14 NOTE — Patient Instructions (Signed)
It was so nice to see you today. Thank you so much for coming in.    You have some wear and tear in your back and this is likely causing your pain.   I sent a message to [facility] and she should be putting in PT orders at the [facility] They should call you to schedule.   I will see you back in 6-8 weeks. If you are doing worse and we need to change the appointment to be with Dr. Myer Haff then let me know.  Please do not hesitate to call if you have any questions or concerns. You can also message me in MyChart.   Drake Leach PA-C [phone]     The physicians and staff at [facility] are committed to providing excellent care. You may receive a survey asking for feedback about your experience at our office. We value you your feedback and appreciate you taking the time to to fill it out. The [facility] leadership team is also available to discuss your experience in person, feel free to contact us [phone].

## 2023-12-19 ENCOUNTER — Telehealth: Payer: Self-pay | Admitting: Urology

## 2023-12-19 NOTE — Telephone Encounter (Signed)
 Pt called and wanted to speak with [facility] about prior appt. Pt did not specify reason. Pt wanted a call back to speak with cma or shannon.

## 2023-12-20 NOTE — Telephone Encounter (Signed)
 Pt would like is T level checked.  Last level checked 11/24 - 646.   F/u Visit - 5/25  Pt states he feel crappy and doesn't want to wait until his next visit.

## 2023-12-21 ENCOUNTER — Other Ambulatory Visit: Payer: Self-pay

## 2023-12-21 DIAGNOSIS — N401 Enlarged prostate with lower urinary tract symptoms: Secondary | ICD-10-CM

## 2023-12-21 DIAGNOSIS — E291 Testicular hypofunction: Secondary | ICD-10-CM

## 2023-12-21 NOTE — Telephone Encounter (Signed)
 Pt aware.   Appt made for 3/4.

## 2023-12-26 ENCOUNTER — Other Ambulatory Visit: Payer: Self-pay

## 2023-12-26 DIAGNOSIS — N529 Male erectile dysfunction, unspecified: Secondary | ICD-10-CM

## 2023-12-26 DIAGNOSIS — N401 Enlarged prostate with lower urinary tract symptoms: Secondary | ICD-10-CM

## 2023-12-27 ENCOUNTER — Other Ambulatory Visit: Payer: Medicare Other

## 2023-12-27 DIAGNOSIS — N529 Male erectile dysfunction, unspecified: Secondary | ICD-10-CM

## 2023-12-27 DIAGNOSIS — N401 Enlarged prostate with lower urinary tract symptoms: Secondary | ICD-10-CM

## 2023-12-28 LAB — HEMOGLOBIN AND HEMATOCRIT, BLOOD
Hematocrit: 49.3 % (ref 37.5–51.0)
Hemoglobin: 16.2 g/dL (ref 13.0–17.7)

## 2023-12-28 LAB — PSA: Prostate Specific Ag, Serum: 1.4 ng/mL (ref 0.0–4.0)

## 2023-12-28 LAB — TESTOSTERONE: Testosterone: 695 ng/dL (ref 264–916)

## 2024-01-03 NOTE — Progress Notes (Deleted)
 Referring Physician:  Gracelyn Nurse, MD [address] [facility] [place],  [place] [address]  Primary Physician:  Gracelyn Nurse, MD  History of Present Illness: He has a history of obesity, CAD, HTN, hyperlipidemia, prediabetes, and reactive airway disease.   Last seen by Dr. Myer Haff on 11/25/22 for follow up. History of lumbar surgery years ago, ?lumbar decompression.   He has known diffuse lumbar spondylosis with significant stenosis at L3-L4 as well as moderate bilateral facet arthropathy at L3-L4 and L4-L5.   He had improving right foot drop and previous EMG showed peripheral nerve dysfunction.   No surgery recommended as he was doing well.   He has been seeing PMR and having injections. He is here for follow up.   He has intermittent LBP with left lateral thigh pain to his knee and posterior thigh pain in right. He has tightness in his lower back.  He had bilateral L4-5 transforaminal ESI on 10/31/23 that helped his left sided back and leg. Right leg pain has been worse since the injection. No numbness or tingling. He thinks weakness in right foot is better. No weakness in left leg.   He is doing stretches at [employer] twice a week with some improvement. [facility] and [facility] light weights.   No bowel or bladder issues.   He does not smoke. He uses nicotine pouches rarely.   Conservative measures:  Physical therapy: initial eval on 12/25/23 and did one visit on 12/28/23.  Multimodal medical therapy including regular antiinflammatories: aleve  Injections:  10/31/2023: Bilateral L4-5 transforaminal ESI (dexamethasone 13 mg) 12/01/2022: Bilateral L4-5 transforaminal ESI (70% relief,dexamethasone 13 mg)  Left L4-L5 TF ESI 08/06/22 (Chasnis) Left L4-L5 TF ESI 03/09/22 (Chasnis) Left L4-L5 TF ESI 05/03/22 (Chasnis)  Past Surgery: ? Decompressive lumbar surgery in 1984 with Dr. Thedore Mins has no symptoms of cervical  myelopathy.  The symptoms are causing a significant impact on the patient's life.   Review of Systems:  A 10 point review of systems is negative, except for the pertinent positives and negatives detailed in the HPI.  Past Medical History: Past Medical History:  Diagnosis Date   Actinic keratosis    Arthritis    Basal cell carcinoma 02/23/2008   Left shoulder.    COVID-19 06/2020   ED (erectile dysfunction)    History of right foot drop    Hx of basal cell carcinoma 01/24/2017   Left lateral canthus. Nodular pattern. Tx: EDC   Hyperlipidemia    history of elevated cholesterol, better after pt started exercising.   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   Pre-diabetes     Past Surgical History: Past Surgical History:  Procedure Laterality Date   BACK SURGERY  1984   L4-5   CATARACT EXTRACTION W/PHACO Right 06/03/2015   Procedure: CATARACT EXTRACTION PHACO AND INTRAOCULAR LENS PLACEMENT (IOC);  Surgeon: Galen Manila, MD;  Location: [facility] ORS;  Service: Ophthalmology;  Laterality: Right;  [place]: 01:26.5AP%: 19.7CDE: 17.07Fluid lot# [id number]   CATARACT EXTRACTION W/PHACO Left 06/07/2023   Procedure: CATARACT EXTRACTION PHACO AND INTRAOCULAR LENS PLACEMENT (IOC) LEFT 6.32  00:40.9;  Surgeon: Galen Manila, MD;  Location: [facility];  Service: Ophthalmology;  Laterality: Left;   COLONOSCOPY N/A 08/16/2016   Procedure: COLONOSCOPY;  Surgeon: Christena Deem, MD;  Location: [facility] ENDOSCOPY;  Service: Endoscopy;  Laterality: N/A;   COLONOSCOPY N/A 02/22/2022   Procedure: COLONOSCOPY;  Surgeon: Jaynie Collins, DO;  Location: [facility] ENDOSCOPY;  Service: Gastroenterology;   Laterality: N/A;   CYSTOSCOPY WITH INSERTION OF UROLIFT N/A 12/11/2020   Procedure: CYSTOSCOPY WITH INSERTION OF UROLIFT;  Surgeon: Orson Ape, MD;  Location: [facility] ORS;  Service: Urology;  Laterality: N/A;   EYE SURGERY     FRACTURE SURGERY Right 1976   ankle   HERNIA REPAIR Left 1972   nausea and vomiting with general anesthesia   JOINT REPLACEMENT Left 2012   JOINT REPLACEMENT Right 2014   knees replaced     TOTAL HIP ARTHROPLASTY Right 12/08/2016   Procedure: TOTAL HIP ARTHROPLASTY;  Surgeon: Donato Heinz, MD;  Location: [facility] ORS;  Service: Orthopedics;  Laterality: Right;    Allergies: Allergies as of 01/09/2024   (No Known Allergies)    Medications: Outpatient Encounter Medications as of 01/09/2024  Medication Sig   albuterol (VENTOLIN HFA) 108 (90 Base) MCG/ACT inhaler Inhale 2 puffs into the lungs daily as needed.   aspirin 325 MG tablet Take 325 mg by mouth daily.   b complex vitamins capsule Take 1 capsule by mouth daily.   cyanocobalamin (VITAMIN B12) 500 MCG tablet Take 500 mcg by mouth daily.   DHEA 50 MG TABS Take by mouth.   fluticasone (FLONASE) 50 MCG/ACT nasal spray Place 1 spray into both nostrils daily as needed for allergies.   ketoconazole (NIZORAL) 2 % cream Apply 1 Application topically at bedtime. Qhs to feet   losartan (COZAAR) 25 MG tablet Take 1 tablet by mouth daily.   Multiple Vitamin (MULTIVITAMIN WITH MINERALS) TABS tablet Take 1 tablet by mouth daily.   NEEDLE, DISP, 18 G (BD DISP NEEDLES) 18G X 1-1/2" MISC 1 mg by Does not apply route every 14 (fourteen) days.   NEEDLE, DISP, 21 G (BD DISP NEEDLES) 21G X 1-1/2" MISC 1 mg by Does not apply route every 14 (fourteen) days.   Polyethyl Glycol-Propyl Glycol (SYSTANE OP) Place 1 drop into both eyes daily as needed (dry eyes).   Probiotic Product (PROBIOTIC DAILY PO) Take by mouth.   sildenafil (REVATIO) 20 MG tablet 1 to 5 tablets as needed an hour prior to intercourse.   Syringe, Disposable,  (2-3CC SYRINGE) 3 ML MISC 1 mg by Does not apply route every 14 (fourteen) days.   tadalafil (CIALIS) 5 MG tablet Take 1 tablet (5 mg total) by mouth daily.   testosterone cypionate (DEPOTESTOSTERONE CYPIONATE) 200 MG/ML injection Inject 0.5 mLs (100 mg total) into the muscle once a week.   TURMERIC CURCUMIN PO Take 1 tablet by mouth daily. With coq 10   Zinc 30 MG TABS Take 30 mg by mouth daily.   No facility-administered encounter medications on file as of 01/09/2024.    Social History: Social History   Tobacco Use   Smoking status: Never   Smokeless tobacco: Former    Types: Chew    Quit date: 03/2022  Vaping Use   Vaping status: Never Used  Substance Use Topics   Alcohol use: Yes    Alcohol/week: 3.0 - 5.0 standard drinks of alcohol    Types: 1 - 2 Glasses of wine, 2 - 3 Cans of beer per week  Comment: OCC   Drug use: No    Comment: DELTA 8 OCC    Family Medical History: No family history on file.  Physical Examination: There were no vitals filed for this visit.    Awake, alert, oriented to person, place, and time.  Speech is clear and fluent. Fund of knowledge is appropriate.   Cranial Nerves: Pupils equal round and reactive to light.  Facial tone is symmetric.    Strength:  Side Iliopsoas Quads Hamstring PF DF EHL  R 5 5 5 5 4 4   L 5 5 5 5 5 5    Bilateral lower extremity sensation is intact to light touch.     He has limited ROM of right ankle.   He cannot heel stand on right foot. He can toe stand on right and left foot independently.   Medical Decision Making  Imaging: none   Assessment and Plan: Mr. Anderle has intermittent LBP with left lateral thigh pain to his knee and posterior thigh pain in right. He has tightness in his lower back. He thinks weakness in right foot is better. No weakness in left leg.   History of lumbar surgery years ago, ?lumbar decompression.   He has known diffuse lumbar spondylosis with significant stenosis at L3-L4 as  well as moderate bilateral facet arthropathy at L3-L4 and L4-L5.   He had improving right foot drop and previous EMG showed peripheral nerve dysfunction.   Treatment options discussed with patient and following plan made:   - PT for lumbar spine. Message to [facility] to do orders at [facility].  - He will follow up with Whitney as scheduled to discuss further injections.  - If no improvement with PT, he may be surgical candidate. Would need updated lumbar MRI (last was 08/24/22) and then follow up with Dr. Myer Haff.   I spent a total of 20 minutes in face-to-face and non-face-to-face activities related to this patient's care toda including review of outside records, review of imaging, review of symptoms, physical exam, discussion of differential diagnosis, discussion of treatment options, and documentation.   Drake Leach PA-C Dept. of Neurosurgery

## 2024-01-09 ENCOUNTER — Ambulatory Visit: Payer: Medicare Other | Admitting: Orthopedic Surgery

## 2024-01-13 ENCOUNTER — Other Ambulatory Visit: Payer: Self-pay

## 2024-01-13 DIAGNOSIS — E291 Testicular hypofunction: Secondary | ICD-10-CM

## 2024-01-13 NOTE — Telephone Encounter (Signed)
 Pt is requesting a refill of Testosterone.  Last T level- 12/27/23 - 695

## 2024-01-17 MED ORDER — TESTOSTERONE CYPIONATE 200 MG/ML IM SOLN
100.0000 mg | INTRAMUSCULAR | 0 refills | Status: DC
Start: 2024-01-17 — End: 2024-02-16

## 2024-01-25 NOTE — Progress Notes (Deleted)
 Referring Physician:  Gracelyn Nurse, MD [address] [facility] [place],  [place] [address]  Primary Physician:  Gracelyn Nurse, MD  History of Present Illness: He has a history of obesity, CAD, HTN, hyperlipidemia, prediabetes, and reactive airway disease.   History of lumbar surgery years ago, ?lumbar decompression.   He has known diffuse lumbar spondylosis with significant stenosis at L3-L4 as well as moderate bilateral facet arthropathy at L3-L4 and L4-L5.   Previous EMG showed peripheral nerve dysfunction.   Last seen by me on 11/14/23 for LBP and left leg pain. Previuos right foot drop was better. He was sent to PT and advised to follow up with PMR for injections (did not have any).   He has known severe left hip OA. Had left hip injection on 01/10/24 with over 90% improvement in his pain.   He is being scheduled for left THA with Dr. Audelia Acton.         No surgery recommended as he was doing well.   He has been seeing PMR and having injections. He is here for follow up.   He has intermittent LBP with left lateral thigh pain to his knee and posterior thigh pain in right. He has tightness in his lower back.  He had bilateral L4-5 transforaminal ESI on 10/31/23 that helped his left sided back and leg. Right leg pain has been worse since the injection. No numbness or tingling. He thinks weakness in right foot is better. No weakness in left leg.   He is doing stretches at [employer] twice a week with some improvement. [facility] and [facility] light weights.   No bowel or bladder issues.   He does not smoke. He uses nicotine pouches rarely.   Conservative measures:  Physical therapy: initial eval on 12/25/23 and did 3 more visits through 01/12/24 Multimodal medical therapy including regular antiinflammatories: aleve  Injections:  10/31/2023: Bilateral L4-5 transforaminal ESI (dexamethasone 13 mg) 12/01/2022: Bilateral L4-5  transforaminal ESI (70% relief,dexamethasone 13 mg)  Left L4-L5 TF ESI 08/06/22 (Chasnis) Left L4-L5 TF ESI 03/09/22 (Chasnis) Left L4-L5 TF ESI 05/03/22 (Chasnis)  Past Surgery: ? Decompressive lumbar surgery in 1984 with Dr. Thedore Mins has no symptoms of cervical myelopathy.  The symptoms are causing a significant impact on the patient's life.   Review of Systems:  A 10 point review of systems is negative, except for the pertinent positives and negatives detailed in the HPI.  Past Medical History: Past Medical History:  Diagnosis Date   Actinic keratosis    Arthritis    Basal cell carcinoma 02/23/2008   Left shoulder.    COVID-19 06/2020   ED (erectile dysfunction)    History of right foot drop    Hx of basal cell carcinoma 01/24/2017   Left lateral canthus. Nodular pattern. Tx: EDC   Hyperlipidemia    history of elevated cholesterol, better after pt started exercising.   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   Pre-diabetes     Past Surgical History: Past Surgical History:  Procedure Laterality Date   BACK SURGERY  1984  L4-5   CATARACT EXTRACTION W/PHACO Right 06/03/2015   Procedure: CATARACT EXTRACTION PHACO AND INTRAOCULAR LENS PLACEMENT (IOC);  Surgeon: Galen Manila, MD;  Location: [facility] ORS;  Service: Ophthalmology;  Laterality: Right;  [place]: 01:26.5AP%: 19.7CDE: 17.07Fluid lot# [id number]   CATARACT EXTRACTION W/PHACO Left 06/07/2023   Procedure: CATARACT EXTRACTION PHACO AND INTRAOCULAR LENS PLACEMENT (IOC) LEFT 6.32 00:40.9;  Surgeon: Galen Manila, MD;  Location: [facility];  Service: Ophthalmology;  Laterality: Left;   COLONOSCOPY N/A  08/16/2016   Procedure: COLONOSCOPY;  Surgeon: Christena Deem, MD;  Location: [facility] ENDOSCOPY;  Service: Endoscopy;  Laterality: N/A;   COLONOSCOPY N/A 02/22/2022   Procedure: COLONOSCOPY;  Surgeon: Jaynie Collins, DO;  Location: [facility] ENDOSCOPY;  Service: Gastroenterology;  Laterality: N/A;   CYSTOSCOPY WITH INSERTION OF UROLIFT N/A 12/11/2020   Procedure: CYSTOSCOPY WITH INSERTION OF UROLIFT;  Surgeon: Orson Ape, MD;  Location: [facility] ORS;  Service: Urology;  Laterality: N/A;   EYE SURGERY     FRACTURE SURGERY Right 1976   ankle   HERNIA REPAIR Left 1972   nausea and vomiting with general anesthesia   JOINT REPLACEMENT Left 2012   JOINT REPLACEMENT Right 2014   knees replaced     TOTAL HIP ARTHROPLASTY Right 12/08/2016   Procedure: TOTAL HIP ARTHROPLASTY;  Surgeon: Donato Heinz, MD;  Location: [facility] ORS;  Service: Orthopedics;  Laterality: Right;    Allergies: Allergies as of 01/30/2024   (No Known Allergies)    Medications: Outpatient Encounter Medications as of 01/30/2024  Medication Sig   albuterol (VENTOLIN HFA) 108 (90 Base) MCG/ACT inhaler Inhale 2 puffs into the lungs daily as needed.   aspirin 325 MG tablet Take 325 mg by mouth daily.   b complex vitamins capsule Take 1 capsule by mouth daily.   cyanocobalamin (VITAMIN B12) 500 MCG tablet Take 500 mcg by mouth daily.   DHEA 50 MG TABS Take by mouth.   fluticasone (FLONASE) 50 MCG/ACT nasal spray Place 1 spray into both nostrils daily as needed for allergies.   ketoconazole (NIZORAL) 2 % cream Apply 1 Application topically at bedtime. Qhs to feet   losartan (COZAAR) 25 MG tablet Take 1 tablet by mouth daily.   Multiple Vitamin (MULTIVITAMIN WITH MINERALS) TABS tablet Take 1 tablet by mouth daily.   NEEDLE, DISP, 18 G (BD DISP NEEDLES) 18G X 1-1/2" MISC 1 mg by Does not apply route every 14 (fourteen) days.   NEEDLE, DISP, 21 G (BD DISP NEEDLES) 21G X 1-1/2" MISC 1 mg by Does not apply route every 14 (fourteen)  days.   Polyethyl Glycol-Propyl Glycol (SYSTANE OP) Place 1 drop into both eyes daily as needed (dry eyes).   Probiotic Product (PROBIOTIC DAILY PO) Take by mouth.   sildenafil (REVATIO) 20 MG tablet 1 to 5 tablets as needed an hour prior to intercourse.   Syringe, Disposable, (2-3CC SYRINGE) 3 ML MISC 1 mg by Does not apply route every 14 (fourteen) days.   tadalafil (CIALIS) 5 MG tablet Take 1 tablet (5 mg total) by mouth daily.   testosterone cypionate (DEPOTESTOSTERONE CYPIONATE) 200 MG/ML injection Inject 0.5 mLs (100 mg total) into the muscle once a week.   TURMERIC CURCUMIN PO Take 1 tablet by mouth daily. With coq 10   Zinc 30 MG TABS Take 30 mg by mouth daily.   No facility-administered encounter medications on file as of 01/30/2024.    Social History: Social History   Tobacco Use   Smoking status: Never  Smokeless tobacco: Former    Types: Chew    Quit date: 03/2022  Vaping Use   Vaping status: Never Used  Substance Use Topics   Alcohol use: Yes    Alcohol/week: 3.0 - 5.0 standard drinks of alcohol    Types: 1 - 2 Glasses of wine, 2 - 3 Cans of beer per week    Comment: OCC   Drug use: No    Comment: DELTA 8 OCC    Family Medical History: No family history on file.  Physical Examination: There were no vitals filed for this visit.    Awake, alert, oriented to person, place, and time.  Speech is clear and fluent. Fund of knowledge is appropriate.   Cranial Nerves: Pupils equal round and reactive to light.  Facial tone is symmetric.    Strength:  Side Iliopsoas Quads Hamstring PF DF EHL  R 5 5 5 5 4 4   L 5 5 5 5 5 5    Bilateral lower extremity sensation is intact to light touch.     He has limited ROM of right ankle.   He cannot heel stand on right foot. He can toe stand on right and left foot independently.   Medical Decision Making  Imaging: none   Assessment and Plan: Mr. Vonderhaar has intermittent LBP with left lateral thigh pain to his knee and  posterior thigh pain in right. He has tightness in his lower back. He thinks weakness in right foot is better. No weakness in left leg.   History of lumbar surgery years ago, ?lumbar decompression.   He has known diffuse lumbar spondylosis with significant stenosis at L3-L4 as well as moderate bilateral facet arthropathy at L3-L4 and L4-L5.   He had improving right foot drop and previous EMG showed peripheral nerve dysfunction.   Treatment options discussed with patient and following plan made:   - PT for lumbar spine. Message to [facility] to do orders at [facility].  - He will follow up with [facility] as scheduled to discuss further injections.  - If no improvement with PT, he may be surgical candidate. Would need updated lumbar MRI (last was 08/24/22) and then follow up with Dr. Myer Haff.   I spent a total of 20 minutes in face-to-face and non-face-to-face activities related to this patient's care toda including review of outside records, review of imaging, review of symptoms, physical exam, discussion of differential diagnosis, discussion of treatment options, and documentation.   Drake Leach PA-C Dept. of Neurosurgery

## 2024-01-26 ENCOUNTER — Other Ambulatory Visit: Payer: Self-pay | Admitting: Orthopedic Surgery

## 2024-01-30 ENCOUNTER — Ambulatory Visit: Admitting: Orthopedic Surgery

## 2024-02-07 ENCOUNTER — Other Ambulatory Visit

## 2024-02-16 ENCOUNTER — Encounter
Admission: RE | Admit: 2024-02-16 | Discharge: 2024-02-16 | Disposition: A | Discharge status: Home / Self Care | Source: Ambulatory Visit | Attending: Orthopedic Surgery | Admitting: Orthopedic Surgery

## 2024-02-16 ENCOUNTER — Other Ambulatory Visit: Payer: Self-pay | Admitting: Urology

## 2024-02-16 ENCOUNTER — Other Ambulatory Visit: Payer: Self-pay

## 2024-02-16 ENCOUNTER — Encounter: Payer: Self-pay | Admitting: Orthopedic Surgery

## 2024-02-16 VITALS — BP 150/59 | HR 68 | Resp 24 | Ht 72.0 in | Wt 264.0 lb

## 2024-02-16 DIAGNOSIS — Z01818 Encounter for other preprocedural examination: Secondary | ICD-10-CM | POA: Diagnosis present

## 2024-02-16 DIAGNOSIS — E291 Testicular hypofunction: Secondary | ICD-10-CM

## 2024-02-16 HISTORY — DX: Family history of other specified conditions: Z84.89

## 2024-02-16 LAB — URINALYSIS, ROUTINE W REFLEX MICROSCOPIC
Bilirubin Urine: NEGATIVE
Glucose, UA: NEGATIVE mg/dL
Hgb urine dipstick: NEGATIVE
Ketones, ur: NEGATIVE mg/dL
Leukocytes,Ua: NEGATIVE
Nitrite: NEGATIVE
Protein, ur: NEGATIVE mg/dL
Specific Gravity, Urine: 1.02 (ref 1.005–1.030)
pH: 5 (ref 5.0–8.0)

## 2024-02-16 LAB — COMPREHENSIVE METABOLIC PANEL WITH GFR
ALT: 23 U/L (ref 0–44)
AST: 21 U/L (ref 15–41)
Albumin: 3.8 g/dL (ref 3.5–5.0)
Alkaline Phosphatase: 73 U/L (ref 38–126)
Anion gap: 8 (ref 5–15)
BUN: 18 mg/dL (ref 8–23)
CO2: 21 mmol/L — ABNORMAL LOW (ref 22–32)
Calcium: 8.6 mg/dL — ABNORMAL LOW (ref 8.9–10.3)
Chloride: 108 mmol/L (ref 98–111)
Creatinine, Ser: 0.94 mg/dL (ref 0.61–1.24)
GFR, Estimated: >60 mL/min (ref >60)
Glucose, Bld: 150 mg/dL — ABNORMAL HIGH (ref 70–99)
Potassium: 4.3 mmol/L (ref 3.5–5.1)
Sodium: 137 mmol/L (ref 135–145)
Total Bilirubin: 0.8 mg/dL (ref 0.0–1.2)
Total Protein: 6.7 g/dL (ref 6.5–8.1)

## 2024-02-16 LAB — CBC WITH DIFFERENTIAL/PLATELET
Abs Immature Granulocytes: 0.03 10*3/uL (ref 0.00–0.07)
Basophils Absolute: 0 10*3/uL (ref 0.0–0.1)
Basophils Relative: 0 %
Eosinophils Absolute: 0.2 10*3/uL (ref 0.0–0.5)
Eosinophils Relative: 3 %
HCT: 47 % (ref 39.0–52.0)
Hemoglobin: 15.5 g/dL (ref 13.0–17.0)
Immature Granulocytes: 1 %
Lymphocytes Relative: 21 %
Lymphs Abs: 1.1 10*3/uL (ref 0.7–4.0)
MCH: 28.3 pg (ref 26.0–34.0)
MCHC: 33 g/dL (ref 30.0–36.0)
MCV: 85.8 fL (ref 80.0–100.0)
Monocytes Absolute: 0.6 10*3/uL (ref 0.1–1.0)
Monocytes Relative: 12 %
Neutro Abs: 3.4 10*3/uL (ref 1.7–7.7)
Neutrophils Relative %: 63 %
Platelets: 278 10*3/uL (ref 150–400)
RBC: 5.48 MIL/uL (ref 4.22–5.81)
RDW: 14.9 % (ref 11.5–15.5)
WBC: 5.4 10*3/uL (ref 4.0–10.5)
nRBC: 0 % (ref 0.0–0.2)

## 2024-02-16 LAB — TYPE AND SCREEN
ABO/RH(D): O POS
Antibody Screen: NEGATIVE

## 2024-02-16 LAB — SURGICAL PCR SCREEN
MRSA, PCR: NEGATIVE
Staphylococcus aureus: NEGATIVE

## 2024-02-16 MED ORDER — TESTOSTERONE CYPIONATE 200 MG/ML IM SOLN
100.0000 mg | INTRAMUSCULAR | 0 refills | Status: DC
Start: 2024-02-16 — End: 2024-07-12

## 2024-02-16 NOTE — Progress Notes (Signed)
 Perioperative / Anesthesia Services  Pre-Admission Testing Clinical Review / Pre-Operative Anesthesia Consult  Date: 02/17/24  Patient Demographics:  Name: Donald [name] DOB: [birth year] MRN:   [id number]  Planned Surgical Procedure(s):    Case: [id number] Date/Time: 02/20/24 0715   Procedure: ARTHROPLASTY, HIP, TOTAL, ANTERIOR APPROACH (Left: Hip)   Anesthesia type: Choice   Diagnosis: Primary osteoarthritis of left hip [M16.12]   Pre-op diagnosis: Primary osteoarthritis of left hip M16.12   Location: [facility] OR ROOM 02 / [facility]   Surgeons: Venus Ginsberg, MD      NOTE: Available PAT nursing documentation and vital signs have been reviewed. Clinical nursing staff has updated patient's PMH/PSHx, current medication list, and drug allergies/intolerances to ensure comprehensive history available to assist in medical decision making as it pertains to the aforementioned surgical procedure and anticipated anesthetic course. Extensive review of available clinical information personally performed. [place] [facility]H and PSHx updated with any diagnoses/procedures that  may have been inadvertently omitted during his intake with the pre-admission testing department's nursing staff.  Clinical Discussion:  Donald [name] is a 73 y.o. male who is submitted for pre-surgical anesthesia review and clearance prior to him undergoing the above procedure.  Patient is a former smoker (0.25 ppd x 25-35 years).  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 He was last seen in the cardiology clinic on 07/20/2023; notes reviewed. At the time of his clinic visit, patient doing well overall from a cardiovascular perspective. Patient denied any chest pain, shortness of breath, PND, orthopnea, palpitations,  significant peripheral edema, weakness, fatigue, vertiginous symptoms, or presyncope/syncope. Patient with a past medical history significant for cardiovascular diagnoses. Documented physical exam was grossly benign, providing no evidence of acute exacerbation and/or decompensation of the patient's known cardiovascular conditions.  Coronary CTA was performed on 09/22/2021 that demonstrated an Agatston coronary artery calcium score of 107. This placed patient in the 43rd percentile for age, sex, and race matched controls. Calcium depositions noted to be isolated mainly in the proximal LAD (25%) and mid RCA (<25%) distributions.  Study demonstrates normal coronary origin with RIGHT dominance.  Blood pressure reasonably controlled at 132/64 mmHg on currently prescribed ARB (losartan ) monotherapy.  Patient not taking any type of lipid-lowering therapies for his HLD diagnosis and further ASCVD prevention.  He does have a prediabetes diagnosis that he is managing with diet and lifestyle modifications.  Most recent hemoglobin A1c was 6.0% when checked on 09/12/2023. In the setting of known cardiovascular diagnoses, it is important note that patient is on a PDE5i medication (tadalafil  + sildenafil ) and exogenous testosterone  replacement as treatment modalities for his male hypogonadism diagnosis.  Patient does not have an OSAH diagnosis.  Patient is reported to be active per baseline.  He plays racquetball for 1 to 1.5 hours at least a couple times a week.  He is able to complete all of his ADLs/IADLs without cardiovascular limitation.  Per the [facility], patient is able to exceed 4 METS of physical activity without experiencing any significant degrees of angina/anginal equivalent symptoms.  No changes were made to his medication regimen.  Patient to follow-up with outpatient cardiology in 1 year or sooner if needed.  Donald [name] is scheduled for an elective ARTHROPLASTY, HIP, TOTAL, ANTERIOR APPROACH (Left: Hip) on  02/20/2024 with Dr. Venus Ginsberg, MD.  Given  patient's past medical history significant for cardiovascular diagnoses, presurgical cardiac clearance was sought by the PAT team. Per cardiology, "this patient is optimized for surgery and may proceed with the planned procedural course with a LOW risk of significant perioperative cardiovascular complications".  In review of the patient's chart, it is noted that he is on daily oral antithrombotic therapy. He has been instructed on recommendations for holding his daily full dose ASA for 5 days prior to his procedure with plans to restart as soon as postoperative bleeding risk felt to be minimized by his attending surgeon. The patient has been instructed that his last dose of ASA should be on 02/14/2024.  Patient reports previous perioperative complications with anesthesia in the past. Patient has a PMH (+) for PONV. Symptoms and history of PONV will be discussed with patient by anesthesia team on the day of her procedure. Interventions will be ordered as deemed necessary based on patient's individual care needs as determined by anesthesiologist. In review his EMR, it is noted that patient underwent a MAC anesthetic course at [facility] ([facility]) in 05/2023 without documented complications.      02/16/2024    8:18 AM 11/14/2023    2:34 PM 09/20/2023    2:49 PM  Vitals with BMI  Height 6\' 0"  6\' 0"  6\' 0"   Weight 264 lbs 262 lbs 262 lbs  BMI 35.8 35.53 35.53  Systolic 150 136 161  Diastolic 59 84 82  Pulse 68  72   Providers/Specialists:  NOTE: Primary physician provider listed below. Patient may have been seen by APP or partner within same practice.   PROVIDER ROLE / SPECIALTY LAST Florian Hurt, MD Orthopedics (Surgeon) 02/15/2024  Little Riff, MD Primary Care Provider 09/20/2023  Percival Brace, MD Cardiology 07/20/2023  Earvin Goldberg, MD Physiatry 11/28/2023   Allergies:  No Known Allergies Current Home  Medications:   No current facility-administered medications for this encounter.    albuterol (VENTOLIN HFA) 108 (90 Base) MCG/ACT inhaler   aspirin 325 MG tablet   b complex vitamins capsule   cyanocobalamin  (VITAMIN B12) 500 MCG tablet   DHEA 50 MG TABS   fluticasone  (FLONASE ) 50 MCG/ACT nasal spray   ketoconazole  (NIZORAL ) 2 % cream   losartan (COZAAR) 25 MG tablet   Multiple Vitamin (MULTIVITAMIN WITH MINERALS) TABS tablet   Polyethyl Glycol-Propyl Glycol (SYSTANE OP)   Probiotic Product (PROBIOTIC DAILY PO)   sildenafil  (REVATIO ) 20 MG tablet   tadalafil  (CIALIS ) 5 MG tablet   TURMERIC CURCUMIN PO   Zinc 30 MG TABS   Celecoxib  (CELEBREX  PO)   NEEDLE, DISP, 18 G (BD DISP NEEDLES) 18G X 1-1/2" MISC   NEEDLE, DISP, 21 G (BD DISP NEEDLES) 21G X 1-1/2" MISC   Syringe, Disposable, (2-3CC SYRINGE) 3 ML MISC   testosterone  cypionate (DEPOTESTOSTERONE CYPIONATE) 200 MG/ML injection   History:   Past Medical History:  Diagnosis Date   Actinic keratosis    Arthritis    Basal cell carcinoma (BCC) of LEFT lateral canthus 01/24/2017   Nodular pattern. Tx: EDC   Basal cell carcinoma (BCC) of left shoulder 02/23/2008   CAD (coronary artery disease)    COVID-19 06/2020   DDD (degenerative disc disease), lumbar    ED (erectile dysfunction)    a.) on PDE5i (tadalafil  + sildenafil )   Family history of adverse reaction to anesthesia    a.) stroke-like symptoms in 1st degree relative (father) following upper GI   Former smoker    History  of bilateral cataract extraction    History of right foot drop    Hyperlipidemia    Hyperplastic polyp of ascending colon    Hypertension    Long-term use of aspirin  therapy    Lumbar stenosis with neurogenic claudication    Lung nodule    Male hypogonadism    a.) on TRT injections (testosterone  cypionate)   Melanoma (HCC) 02/25/2021   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, L med forehead, 03/04/21 LN2, Imiquimod ;Mid forehead inf residual MMIS lentigo  maligna type- MOHs completed 11/25/21   PONV (postoperative nausea and vomiting)    a.) years ago following hernia surgery   Pre-diabetes    Reactive airway disease    Rectal polyp (benign)    Renal cyst, right    Serrated adenoma of colon    Past Surgical History:  Procedure Laterality Date   ANKLE FRACTURE SURGERY Right 1976   BACK SURGERY  1984   L4-5   CATARACT EXTRACTION W/PHACO Right 06/03/2015   Procedure: CATARACT EXTRACTION PHACO AND INTRAOCULAR LENS PLACEMENT (IOC);  Surgeon: Clair Crews, MD;  Location: [facility] ORS;  Service: Ophthalmology;  Laterality: Right;  US : 01:26.5AP%: 19.7CDE: 17.07Fluid lot# [id number]   CATARACT EXTRACTION W/PHACO Left 06/07/2023   Procedure: CATARACT EXTRACTION PHACO AND INTRAOCULAR LENS PLACEMENT (IOC) LEFT 6.32 00:40.9;  Surgeon: Clair Crews, MD;  Location: [facility];  Service: Ophthalmology;  Laterality: Left;   COLONOSCOPY N/A 08/16/2016   Procedure: COLONOSCOPY;  Surgeon: Deveron Fly, MD;  Location: [facility] END[facility]COPY;  Service: Endoscopy;  Laterality: N/A;   COLONOSCOPY N/A 02/22/2022   Procedure: COLONOSCOPY;  Surgeon: Quintin Buckle, DO;  Location: [facility] ENDOSCOPY;  Service: Gastroenterology;  Laterality: N/A;   CYSTOSCOPY WITH INSERTION OF UROLIFT N/A 12/11/2020   Procedure: CYSTOSCOPY WITH INSERTION OF UROLIFT;  Surgeon: Rea Cambridge, MD;  Location: [facility] ORS;  Service: Urology;  Laterality: N/A;   HERNIA REPAIR Left 1972   KNEE ARTHROPLASTY Left 2012   KNEE ARTHROPLASTY Right 2014   TOTAL HIP ARTHROPLASTY Right 12/08/2016   Procedure: TOTAL HIP ARTHROPLASTY;  Surgeon: Arlyne Lame, MD;  Location: [facility] ORS;  Service: Orthopedics;  Laterality: Right;   No family history on file. Social History   Tobacco Use   Smoking status: Former    Types: Cigarettes   Smokeless tobacco: Former    Types: Chew    Quit date: 03/2022   Tobacco comments:    0.25 ppd x 25-35 years for a total of 3 pack years  Substance  Use Topics   Alcohol use: Yes    Alcohol/week: 3.0 - 5.0 standard drinks of alcohol    Types: 1 - 2 Glasses of wine, 2 - 3 Cans of beer per week    Comment: OCC   Pertinent Clinical Results:  LABS:  Hospital Outpatient Visit on 02/16/2024  Component Date Value Ref Range Status   WBC 02/16/2024 5.4  4.0 - 10.5 K/uL Final   RBC 02/16/2024 5.48  4.22 - 5.81 MIL/uL Final   Hemoglobin 02/16/2024 15.5  13.0 - 17.0 g/dL Final   HCT 95/28/4132 47.0  39.0 - 52.0 % Final   MCV 02/16/2024 85.8  80.0 - 100.0 fL Final   MCH 02/16/2024 28.3  26.0 - 34.0 pg Final   MCHC 02/16/2024 33.0  30.0 - 36.0 g/dL Final   RDW 44/10/270 14.9  11.5 - 15.5 % Final   Platelets 02/16/2024 278  150 - 400 K/uL Final   nRBC 02/16/2024 0.0  0.0 - 0.2 %  Final   Neutrophils Relative % 02/16/2024 63  % Final   Neutro Abs 02/16/2024 3.4  1.7 - 7.7 K/uL Final   Lymphocytes Relative 02/16/2024 21  % Final   Lymphs Abs 02/16/2024 1.1  0.7 - 4.0 K/uL Final   Monocytes Relative 02/16/2024 12  % Final   Monocytes Absolute 02/16/2024 0.6  0.1 - 1.0 K/uL Final   Eosinophils Relative 02/16/2024 3  % Final   Eosinophils Absolute 02/16/2024 0.2  0.0 - 0.5 K/uL Final   Basophils Relative 02/16/2024 0  % Final   Basophils Absolute 02/16/2024 0.0  0.0 - 0.1 K/uL Final   Immature Granulocytes 02/16/2024 1  % Final   Abs Immature Granulocytes 02/16/2024 0.03  0.00 - 0.07 K/uL Final   Performed at [facility], [address] [place], [place] [address]   Sodium 02/16/2024 137  135 - 145 mmol/L Final   Potassium 02/16/2024 4.3  3.5 - 5.1 mmol/L Final   Chloride 02/16/2024 108  98 - 111 mmol/L Final   CO2 02/16/2024 21 (L)  22 - 32 mmol/L Final   Glucose, Bld 02/16/2024 150 (H)  70 - 99 mg/dL Final   Glucose reference range applies only to samples taken after fasting for at least 8 hours.   BUN 02/16/2024 18  8 - 23 mg/dL Final   Creatinine, Ser 02/16/2024 0.94  0.61 - 1.24 mg/dL Final   Calcium 60/45/4098 8.6 (L)  8.9 -  10.3 mg/dL Final   Total Protein 11/91/4782 6.7  6.5 - 8.1 g/dL Final   Albumin 95/62/1308 3.8  3.5 - 5.0 g/dL Final   AST 65/78/4696 21  15 - 41 U/L Final   ALT 02/16/2024 23  0 - 44 U/L Final   Alkaline Phosphatase 02/16/2024 73  38 - 126 U/L Final   Total Bilirubin 02/16/2024 0.8  0.0 - 1.2 mg/dL Final   GFR, Estimated 02/16/2024 >60  >60 mL/min Final   Comment: (NOTE) Calculated using the CKD-EPI Creatinine Equation (2021)    Anion gap 02/16/2024 8  5 - 15 Final   Performed at [facility], [address] [place], [place] [address]   Color, Urine 02/16/2024 YELLOW (A)  YELLOW Final   APPearance 02/16/2024 CLEAR (A)  CLEAR Final   Specific Gravity, Urine 02/16/2024 1.020  1.005 - 1.030 Final   pH 02/16/2024 5.0  5.0 - 8.0 Final   Glucose, UA 02/16/2024 NEGATIVE  NEGATIVE mg/dL Final   Hgb urine dipstick 02/16/2024 NEGATIVE  NEGATIVE Final   Bilirubin Urine 02/16/2024 NEGATIVE  NEGATIVE Final   Ketones, ur 02/16/2024 NEGATIVE  NEGATIVE mg/dL Final   Protein, ur 41/32/4401 NEGATIVE  NEGATIVE mg/dL Final   Nitrite 02/72/5366 NEGATIVE  NEGATIVE Final   Leukocytes,Ua 02/16/2024 NEGATIVE  NEGATIVE Final   Performed at [facility], [address] [place], [place] [address]   MRSA, PCR 02/16/2024 NEGATIVE  NEGATIVE Final   Staphylococcus aureus 02/16/2024 NEGATIVE  NEGATIVE Final   Comment: (NOTE) The Xpert SA Assay (FDA approved for NASAL specimens in patients 55 years of age and older), is one component of a comprehensive surveillance program. It is not intended to diagnose infection nor to guide or monitor treatment. Performed at [facility], [address] [place], [place] [address]    ABO/RH(D) 02/16/2024 O POS   Final   Antibody Screen 02/16/2024 NEG   Final   Sample Expiration 02/16/2024 03/01/2024,2359   Final   Extend sample reason 02/16/2024    Final  Value:NO TRANSFUSIONS OR PREGNANCY IN THE PAST 3 MONTHS Performed at  [facility], [address] [place], [place] [address]     ECG: Date: 02/16/2024  Time ECG obtained: 0916 AM Rate: 68 bpm Rhythm:  Sinus rhythm with first-degree AV block Axis (leads I and aVF): normal Intervals: PR 220 ms. QRS 96 ms. QTc 391 ms. ST segment and T wave changes: No evidence of acute T wave abnormalities or significant ST segment elevation or depression.  Evidence of a possible, age undetermined, prior infarct:  No Comparison: Similar to previous tracing obtained on 12/08/2020   IMAGING / PROCEDURES: MR LUMBAR SPINE WO CONTRAST performed on 08/24/2022 No change since earlier this year to explain symptoms. No right L5 impingement. Generalized lumbar spine degeneration with spinal stenosis that is moderate at L1-2 and moderate to advanced at L3-4.   CT CORONARY MORPH W/CTA COR W/SCORE W/CA W/CM &/OR WO/CM performed on 09/22/2021 Coronary calcium score of 107. This was 43rd percentile for age and sex matched control. Normal coronary origin with right dominance. Calcified plaque causing mild stenosis in the proximal LAD. Calcified plaque causing minimal stenosis in the mid RCA. CAD-RADS 2. Mild non-obstructive CAD (25-49%). Consider non-atherosclerotic causes of chest pain. Consider preventive therapy and risk factor modification.  CT CHEST WO CONTRAST performed on 05/22/2021 No acute chest abnormality. Pleural-based nodular density in the lateral left lower lobe has minimally changed since 2010. This finding is suggestive for post inflammatory changes or scarring. Coronary calcium score exam from [facility] in 2021 is not available. Can make an addendum if [facility] images are made available for comparison. Additional small nodular densities in the lungs including a 3 mm nodule along the medial left upper lobe. No follow-up needed if patient is low-risk (and has no known or suspected primary neoplasm). Non-contrast chest CT can be considered in 12 months  if patient is high-risk. This recommendation follows the consensus statement: Guidelines for Management of Incidental Pulmonary Nodules Detected on CT Images: From the Fleischner Society 2017; Radiology 2017; 284:228-243.   Impression and Plan:  Donald [name] has been referred for pre-anesthesia review and clearance prior to him undergoing the planned anesthetic and procedural courses. Available labs, pertinent testing, and imaging results were personally reviewed by me in preparation for upcoming operative/procedural course. [facility] medical record has been updated following extensive record review and patient interview with PAT staff.   This patient has been appropriately cleared by cardiology with an overall LOW risk of patient experiencing significant perioperative cardiovascular complications. Based on clinical review performed today (02/17/24), barring any significant acute changes in the patient's overall condition, it is anticipated that he will be able to proceed with the planned surgical intervention. Any acute changes in clinical condition may necessitate his procedure being postponed and/or cancelled. Patient will meet with anesthesia team (MD and/or CRNA) on the day of his procedure for preoperative evaluation/assessment. Questions regarding anesthetic course will be fielded at that time.   Pre-surgical instructions were reviewed with the patient during his PAT appointment, and questions were fielded to satisfaction by PAT clinical staff. He has been instructed on which medications that he will need to hold prior to surgery, as well as the ones that have been deemed safe/appropriate to take on the day of his procedure. As part of the general education provided by PAT, patient made aware both verbally and in writing, that he would need to abstain from the use of any illegal substances during his perioperative course.  He was advised that failure to follow the provided instructions could  necessitate case cancellation or result in serious perioperative complications up to and including death. Patient encouraged to contact PAT and/or his surgeon's office to discuss any questions or concerns that may arise prior to surgery; verbalized understanding.   Renate Caroline, MSN, APRN, FNP-C, CEN [facility]  Perioperative Services Nurse Practitioner Phone: [phone] Fax: [phone] 02/17/24 10:13 AM  NOTE: This note has been prepared using Dragon dictation software. Despite my best ability to proofread, there is always the potential that unintentional transcriptional errors may still occur from this process.

## 2024-02-16 NOTE — Patient Instructions (Addendum)
 Your procedure is scheduled on: Monday 02/20/24 To find out your arrival time, please call [phone] between 1PM - 3PM on:  Friday 02/17/24  Report to the Registration Desk on the 1st floor of the [facility]. FREE Valet parking is available.  If your arrival time is 6:00 am, do not arrive before that time as the [facility] entrance doors do not open until 6:00 am.  REMEMBER: Instructions that are not followed completely may result in serious medical risk, up to and including death; or upon the discretion of your surgeon and anesthesiologist your surgery may need to be rescheduled.  Do not eat food after midnight the night before surgery.  No gum chewing or hard candies.  You may however, drink CLEAR liquids up to 2 hours before you are scheduled to arrive for your surgery. Do not drink anything within 2 hours of your scheduled arrival time.  Clear liquids include: - water  - apple juice without pulp - gatorade (not RED colors) - black coffee or tea (Do NOT add milk or creamers to the coffee or tea) Do NOT drink anything that is not on this list.  Type 1 and Type 2 diabetics should only drink water.  In addition, your doctor has ordered for you to drink the provided:  Ensure Pre-Surgery Clear Carbohydrate Drink  Drinking this carbohydrate drink up to two hours before surgery helps to reduce insulin resistance and improve patient outcomes. Please complete drinking 2 hours before scheduled arrival time.  One week prior to surgery: Stop Anti-inflammatories (NSAIDS) such as Advil, Aleve, Ibuprofen, Motrin, Naproxen, Naprosyn and Aspirin  based products such as Excedrin, Goody's Powder, BC Powder. You may however, continue to take Tylenol  if needed for pain up until the day of surgery.  Stop ANY OVER THE COUNTER supplements until after surgery. You stated you last took these on Monday 02/13/24. You can continue your Probiotic  Continue taking all prescribed medications with the  exception of the following: You will need to stop taking you Cialis  and Sildenafil . Last dose will be tomorrow Friday 02/17/24  TAKE ONLY THESE MEDICATIONS THE MORNING OF SURGERY WITH A SIP OF WATER:  none  Use inhalers on the day of surgery and bring to the hospital.  No Alcohol for 24 hours before or after surgery.  No Smoking including e-cigarettes for 24 hours before surgery.  No chewable tobacco products for at least 6 hours before surgery.  No nicotine patches on the day of surgery.  Do not use any "recreational" drugs for at least a week (preferably 2 weeks) before your surgery.  Please be advised that the combination of cocaine and anesthesia may have negative outcomes, up to and including death. If you test positive for cocaine, your surgery will be cancelled.  On the morning of surgery brush your teeth with toothpaste and water, you may rinse your mouth with mouthwash if you wish. Do not swallow any toothpaste or mouthwash.  Use CHG Soap or wipes as directed on instruction sheet.  Do not wear lotions, powders, or perfumes/cologne on the day of surgery.   Do not shave body hair from the neck down 48 hours before surgery.  Wear comfortable clothing (specific to your surgery type) to the hospital.  Do not wear jewelry, make-up, hairpins, clips or nail polish.  For welded (permanent) jewelry: bracelets, anklets, waist bands, etc.  Please have this removed prior to surgery.  If it is not removed, there is a chance that hospital personnel will need  to cut it off on the day of surgery. Contact lenses, hearing aids and dentures may not be worn into surgery.  Do not bring valuables to the hospital. [facility] is not responsible for any missing/lost belongings or valuables.   Notify your doctor if there is any change in your medical condition (cold, fever, infection).  If you are being discharged the day of surgery, you will not be allowed to drive home. You will need a  responsible individual to drive you home and stay with you for 24 hours after surgery.   If you are taking public transportation, you will need to have a responsible individual with you.  If you are being admitted to the hospital overnight, leave your suitcase in the car. After surgery it may be brought to your room.  In case of increased patient census, it may be necessary for you, the patient, to continue your postoperative care in the Same Day Surgery department.  After surgery, you can help prevent lung complications by doing breathing exercises.  Take deep breaths and cough every 1-2 hours. Your doctor may order a device called an Incentive Spirometer to help you take deep breaths.  Surgery Visitation Policy:  Patients undergoing a surgery or procedure may have two family members or support persons with them as long as the person is not COVID-19 positive or experiencing its symptoms.   Inpatient Visitation:    Visiting hours are 7 a.m. to 8 p.m. Up to four visitors are allowed at one time in a patient room. The visitors may rotate out with other people during the day. One designated support person (adult) may remain overnight.  Please call the Pre-admissions Testing Dept. at [phone] if you have any questions about these instructions.    Pre-operative 5 CHG Bath Instructions   You can play a key role in reducing the risk of infection after surgery. Your skin needs to be as free of germs as possible. You can reduce the number of germs on your skin by washing with CHG (chlorhexidine  gluconate) soap before surgery. CHG is an antiseptic soap that kills germs and continues to kill germs even after washing.   DO NOT use if you have an allergy to chlorhexidine /CHG or antibacterial soaps. If your skin becomes reddened or irritated, stop using the CHG and notify one of our RNs at [phone].   Please shower with the CHG soap starting 4 days before surgery using the following  schedule:   Thursday 02/16/24 (tonight) - Monday 02/20/24    Please keep in mind the following:  DO NOT shave, including legs and underarms, starting the day of your first shower.   You may shave your face at any point before/day of surgery.  Place clean sheets on your bed the day you start using CHG soap. Use a clean washcloth (not used since being washed) for each shower. DO NOT sleep with pets once you start using the CHG.   CHG Shower Instructions:  If you choose to wash your hair and private area, wash first with your normal shampoo/soap.  After you use shampoo/soap, rinse your hair and body thoroughly to remove shampoo/soap residue.  Turn the water OFF and apply about 3 tablespoons (45 ml) of CHG soap to a CLEAN washcloth.  Apply CHG soap ONLY FROM YOUR NECK DOWN TO YOUR TOES (washing for 3-5 minutes)  DO NOT use CHG soap on face, private areas, open wounds, or sores.  Pay special attention to the area where your  surgery is being performed.  If you are having back surgery, having someone wash your back for you may be helpful. Wait 2 minutes after CHG soap is applied, then you may rinse off the CHG soap.  Pat dry with a clean towel  Put on clean clothes/pajamas   If you choose to wear lotion, please use ONLY the CHG-compatible lotions on the back of this paper.     Additional instructions for the day of surgery: DO NOT APPLY any lotions, deodorants, cologne, or perfumes.   Put on clean/comfortable clothes.  Brush your teeth.  Ask your nurse before applying any prescription medications to the skin.      CHG Compatible Lotions   Aveeno Moisturizing lotion  Cetaphil Moisturizing Cream  Cetaphil Moisturizing Lotion  Clairol Herbal Essence Moisturizing Lotion, Dry Skin  Clairol Herbal Essence Moisturizing Lotion, Extra Dry Skin  Clairol Herbal Essence Moisturizing Lotion, Normal Skin  Curel Age Defying Therapeutic Moisturizing Lotion with Alpha Hydroxy  Curel Extreme Care  Body Lotion  Curel Soothing Hands Moisturizing Hand Lotion  Curel Therapeutic Moisturizing Cream, Fragrance-Free  Curel Therapeutic Moisturizing Lotion, Fragrance-Free  Curel Therapeutic Moisturizing Lotion, Original Formula  Eucerin Daily Replenishing Lotion  Eucerin Dry Skin Therapy Plus Alpha Hydroxy Crme  Eucerin Dry Skin Therapy Plus Alpha Hydroxy Lotion  Eucerin Original Crme  Eucerin Original Lotion  Eucerin Plus Crme Eucerin Plus Lotion  Eucerin TriLipid Replenishing Lotion  Keri Anti-Bacterial Hand Lotion  Keri Deep Conditioning Original Lotion Dry Skin Formula Softly Scented  Keri Deep Conditioning Original Lotion, Fragrance Free Sensitive Skin Formula  Keri Lotion Fast Absorbing Fragrance Free Sensitive Skin Formula  Keri Lotion Fast Absorbing Softly Scented Dry Skin Formula  Keri Original Lotion  Keri Skin Renewal Lotion Keri Silky Smooth Lotion  Keri Silky Smooth Sensitive Skin Lotion  Nivea Body Creamy Conditioning Oil  Nivea Body Extra Enriched Lotion  Nivea Body Original Lotion  Nivea Body Sheer Moisturizing Lotion Nivea Crme  Nivea Skin Firming Lotion  NutraDerm 30 Skin Lotion  NutraDerm Skin Lotion  NutraDerm Therapeutic Skin Cream  NutraDerm Therapeutic Skin Lotion  ProShield Protective Hand Cream  Provon moisturizing lotion  How to Use an Incentive Spirometer  An incentive spirometer is a tool that measures how well you are filling your lungs with each breath. Learning to take long, deep breaths using this tool can help you keep your lungs clear and active. This may help to reverse or lessen your chance of developing breathing (pulmonary) problems, especially infection. You may be asked to use a spirometer: After a surgery. If you have a lung problem or a history of smoking. After a long period of time when you have been unable to move or be active. If the spirometer includes an indicator to show the highest number that you have reached, your health care  provider or respiratory therapist will help you set a goal. Keep a log of your progress as told by your health care provider. What are the risks? Breathing too quickly may cause dizziness or cause you to pass out. Take your time so you do not get dizzy or light-headed. If you are in pain, you may need to take pain medicine before doing incentive spirometry. It is harder to take a deep breath if you are having pain. How to use your incentive spirometer  Sit up on the edge of your bed or on a chair. Hold the incentive spirometer so that it is in an upright position. Before you use the spirometer,  breathe out normally. Place the mouthpiece in your mouth. Make sure your lips are closed tightly around it. Breathe in slowly and as deeply as you can through your mouth, causing the piston or the ball to rise toward the top of the chamber. Hold your breath for 3-5 seconds, or for as long as possible. If the spirometer includes a coach indicator, use this to guide you in breathing. Slow down your breathing if the indicator goes above the marked areas. Remove the mouthpiece from your mouth and breathe out normally. The piston or ball will return to the bottom of the chamber. Rest for a few seconds, then repeat the steps 10 or more times. Take your time and take a few normal breaths between deep breaths so that you do not get dizzy or light-headed. Do this every 1-2 hours when you are awake. If the spirometer includes a goal marker to show the highest number you have reached (best effort), use this as a goal to work toward during each repetition. After each set of 10 deep breaths, cough a few times. This will help to make sure that your lungs are clear. If you have an incision on your chest or abdomen from surgery, place a pillow or a rolled-up towel firmly against the incision when you cough. This can help to reduce pain while taking deep breaths and coughing. General tips When you are able to get out of  bed: Walk around often. Continue to take deep breaths and cough in order to clear your lungs. Keep using the incentive spirometer until your health care provider says it is okay to stop using it. If you have been in the hospital, you may be told to keep using the spirometer at home. Contact a health care provider if: You are having difficulty using the spirometer. You have trouble using the spirometer as often as instructed. Your pain medicine is not giving enough relief for you to use the spirometer as told. You have a fever. Get help right away if: You develop shortness of breath. You develop a cough with bloody mucus from the lungs. You have fluid or blood coming from an incision site after you cough. Summary An incentive spirometer is a tool that can help you learn to take long, deep breaths to keep your lungs clear and active. You may be asked to use a spirometer after a surgery, if you have a lung problem or a history of smoking, or if you have been inactive for a long period of time. Use your incentive spirometer as instructed every 1-2 hours while you are awake. If you have an incision on your chest or abdomen, place a pillow or a rolled-up towel firmly against your incision when you cough. This will help to reduce pain. Get help right away if you have shortness of breath, you cough up bloody mucus, or blood comes from your incision when you cough. This information is not intended to replace advice given to you by your health care provider. Make sure you discuss any questions you have with your health care provider. Document Revised: 12/31/2019 Document Reviewed: 12/31/2019 Elsevier Patient Education  2023 [employer].

## 2024-02-17 ENCOUNTER — Encounter: Payer: Self-pay | Admitting: Orthopedic Surgery

## 2024-02-19 MED ORDER — LACTATED RINGERS IV SOLN: INTRAVENOUS | Status: DC

## 2024-02-19 MED ORDER — CHLORHEXIDINE GLUCONATE 0.12 % MT SOLN
15.0000 mL | Freq: Once | OROMUCOSAL | Status: AC
  Administered 2024-02-20: 15 mL via OROMUCOSAL

## 2024-02-19 MED ORDER — ORAL CARE MOUTH RINSE: 15.0000 mL | Freq: Once | OROMUCOSAL | Status: AC

## 2024-02-19 MED ORDER — TRANEXAMIC ACID-NACL 1000-0.7 MG/100ML-% IV SOLN
1000.0000 mg | INTRAVENOUS | Status: AC
  Administered 2024-02-20 (×2): 1000 mg via INTRAVENOUS

## 2024-02-19 MED ORDER — CEFAZOLIN SODIUM-DEXTROSE 2-4 GM/100ML-% IV SOLN
2.0000 g | INTRAVENOUS | Status: AC
  Administered 2024-02-20: 2 g via INTRAVENOUS

## 2024-02-19 MED ORDER — DEXAMETHASONE SODIUM PHOSPHATE 10 MG/ML IJ SOLN: 8.0000 mg | Freq: Once | INTRAMUSCULAR | Status: DC

## 2024-02-20 ENCOUNTER — Encounter: Payer: Self-pay | Admitting: Orthopedic Surgery

## 2024-02-20 ENCOUNTER — Ambulatory Visit: Payer: Self-pay | Admitting: Urgent Care

## 2024-02-20 ENCOUNTER — Other Ambulatory Visit: Payer: Self-pay

## 2024-02-20 ENCOUNTER — Ambulatory Visit

## 2024-02-20 ENCOUNTER — Ambulatory Visit
Admission: RE | Admit: 2024-02-20 | Discharge: 2024-02-21 | Disposition: A | Discharge status: Home Health | Attending: Orthopedic Surgery | Admitting: Orthopedic Surgery

## 2024-02-20 ENCOUNTER — Encounter
Admission: RE | Disposition: A | Discharge status: Home Health | Payer: Self-pay | Source: Home / Self Care | Attending: Orthopedic Surgery

## 2024-02-20 DIAGNOSIS — I2510 Atherosclerotic heart disease of native coronary artery without angina pectoris: Secondary | ICD-10-CM | POA: Insufficient documentation

## 2024-02-20 DIAGNOSIS — J4520 Mild intermittent asthma, uncomplicated: Secondary | ICD-10-CM | POA: Diagnosis not present

## 2024-02-20 DIAGNOSIS — M1612 Unilateral primary osteoarthritis, left hip: Secondary | ICD-10-CM | POA: Diagnosis present

## 2024-02-20 DIAGNOSIS — Z7982 Long term (current) use of aspirin: Secondary | ICD-10-CM | POA: Diagnosis not present

## 2024-02-20 DIAGNOSIS — E119 Type 2 diabetes mellitus without complications: Secondary | ICD-10-CM | POA: Insufficient documentation

## 2024-02-20 DIAGNOSIS — I10 Essential (primary) hypertension: Secondary | ICD-10-CM | POA: Insufficient documentation

## 2024-02-20 DIAGNOSIS — Z79899 Other long term (current) drug therapy: Secondary | ICD-10-CM | POA: Insufficient documentation

## 2024-02-20 DIAGNOSIS — Z6835 Body mass index (BMI) 35.0-35.9, adult: Secondary | ICD-10-CM | POA: Diagnosis not present

## 2024-02-20 DIAGNOSIS — Z96642 Presence of left artificial hip joint: Secondary | ICD-10-CM | POA: Diagnosis present

## 2024-02-20 DIAGNOSIS — E669 Obesity, unspecified: Secondary | ICD-10-CM | POA: Diagnosis not present

## 2024-02-20 HISTORY — DX: Unspecified asthma, uncomplicated: J45.909

## 2024-02-20 HISTORY — DX: Cataract extraction status, right eye: Z98.41

## 2024-02-20 HISTORY — DX: Personal history of nicotine dependence: Z87.891

## 2024-02-20 HISTORY — DX: Cyst of kidney, acquired: N28.1

## 2024-02-20 HISTORY — DX: Atherosclerotic heart disease of native coronary artery without angina pectoris: I25.10

## 2024-02-20 HISTORY — DX: Polyp of colon: K63.5

## 2024-02-20 HISTORY — DX: Benign neoplasm of colon, unspecified: D12.6

## 2024-02-20 HISTORY — DX: Testicular hypofunction: E29.1

## 2024-02-20 HISTORY — DX: Rectal polyp: K62.1

## 2024-02-20 HISTORY — DX: Long term (current) use of aspirin: Z79.82

## 2024-02-20 HISTORY — DX: Other intervertebral disc degeneration, lumbar region without mention of lumbar back pain or lower extremity pain: M51.369

## 2024-02-20 HISTORY — PX: TOTAL HIP ARTHROPLASTY: SHX124

## 2024-02-20 SURGERY — ARTHROPLASTY, HIP, TOTAL, ANTERIOR APPROACH
Anesthesia: Spinal | Site: Hip | Laterality: Left

## 2024-02-20 MED ORDER — CEFAZOLIN SODIUM-DEXTROSE 2-4 GM/100ML-% IV SOLN
INTRAVENOUS | Status: AC
  Filled 2024-02-20: qty 100

## 2024-02-20 MED ORDER — ENOXAPARIN SODIUM 40 MG/0.4ML IJ SOSY
40.0000 mg | PREFILLED_SYRINGE | INTRAMUSCULAR | Status: DC
  Administered 2024-02-21: 40 mg via SUBCUTANEOUS
  Filled 2024-02-20: qty 0.4

## 2024-02-20 MED ORDER — PROPOFOL 1000 MG/100ML IV EMUL
INTRAVENOUS | Status: AC
  Filled 2024-02-20: qty 100

## 2024-02-20 MED ORDER — DOCUSATE SODIUM 100 MG PO CAPS
100.0000 mg | ORAL_CAPSULE | Freq: Two times a day (BID) | ORAL | Status: DC
  Administered 2024-02-20 – 2024-02-21 (×3): 100 mg via ORAL
  Filled 2024-02-20 (×3): qty 1

## 2024-02-20 MED ORDER — ACETAMINOPHEN 325 MG PO TABS: 325.0000 mg | ORAL_TABLET | Freq: Four times a day (QID) | ORAL | Status: DC | PRN

## 2024-02-20 MED ORDER — PROPOFOL 500 MG/50ML IV EMUL
INTRAVENOUS | Status: DC | PRN
  Administered 2024-02-20: 50 mg via INTRAVENOUS
  Administered 2024-02-20: 50 ug/kg/min via INTRAVENOUS

## 2024-02-20 MED ORDER — PROPOFOL 10 MG/ML IV BOLUS
INTRAVENOUS | Status: AC
  Filled 2024-02-20: qty 20

## 2024-02-20 MED ORDER — BUPIVACAINE LIPOSOME 1.3 % IJ SUSP
INTRAMUSCULAR | Status: AC
  Filled 2024-02-20: qty 20

## 2024-02-20 MED ORDER — SODIUM CHLORIDE 0.9 % IV SOLN: INTRAVENOUS | Status: DC

## 2024-02-20 MED ORDER — TRANEXAMIC ACID-NACL 1000-0.7 MG/100ML-% IV SOLN
INTRAVENOUS | Status: AC
  Filled 2024-02-20: qty 100

## 2024-02-20 MED ORDER — METOCLOPRAMIDE HCL 10 MG PO TABS: 5.0000 mg | ORAL_TABLET | Freq: Three times a day (TID) | ORAL | Status: DC | PRN

## 2024-02-20 MED ORDER — TRAMADOL HCL 50 MG PO TABS
50.0000 mg | ORAL_TABLET | Freq: Four times a day (QID) | ORAL | Status: DC | PRN
  Administered 2024-02-20: 50 mg via ORAL
  Filled 2024-02-20: qty 1

## 2024-02-20 MED ORDER — GLYCOPYRROLATE 0.2 MG/ML IJ SOLN
INTRAMUSCULAR | Status: DC | PRN
Start: 2024-02-20 — End: 2024-02-20
  Administered 2024-02-20: .2 mg via INTRAVENOUS

## 2024-02-20 MED ORDER — PANTOPRAZOLE SODIUM 40 MG PO TBEC
40.0000 mg | DELAYED_RELEASE_TABLET | Freq: Every day | ORAL | Status: DC
  Administered 2024-02-20 – 2024-02-21 (×2): 40 mg via ORAL
  Filled 2024-02-20 (×2): qty 1

## 2024-02-20 MED ORDER — CHLORHEXIDINE GLUCONATE 0.12 % MT SOLN
OROMUCOSAL | Status: AC
  Filled 2024-02-20: qty 15

## 2024-02-20 MED ORDER — METOCLOPRAMIDE HCL 5 MG/ML IJ SOLN: 5.0000 mg | Freq: Three times a day (TID) | INTRAMUSCULAR | Status: DC | PRN

## 2024-02-20 MED ORDER — OXYCODONE HCL 5 MG PO TABS: 5.0000 mg | ORAL_TABLET | Freq: Once | ORAL | Status: DC | PRN

## 2024-02-20 MED ORDER — ASPIRIN 325 MG PO TABS
325.0000 mg | ORAL_TABLET | Freq: Every day | ORAL | Status: DC
  Administered 2024-02-21: 325 mg via ORAL
  Filled 2024-02-20: qty 1

## 2024-02-20 MED ORDER — ACETAMINOPHEN 500 MG PO TABS
1000.0000 mg | ORAL_TABLET | Freq: Three times a day (TID) | ORAL | Status: DC
  Administered 2024-02-20 – 2024-02-21 (×3): 1000 mg via ORAL
  Filled 2024-02-20 (×3): qty 2

## 2024-02-20 MED ORDER — BUPIVACAINE HCL (PF) 0.5 % IJ SOLN
INTRAMUSCULAR | Status: DC | PRN
  Administered 2024-02-20: 3 mL

## 2024-02-20 MED ORDER — MIDAZOLAM HCL 2 MG/2ML IJ SOLN
INTRAMUSCULAR | Status: AC
  Filled 2024-02-20: qty 2

## 2024-02-20 MED ORDER — PHENYLEPHRINE 80 MCG/ML (10ML) SYRINGE FOR IV PUSH (FOR BLOOD PRESSURE SUPPORT)
PREFILLED_SYRINGE | INTRAVENOUS | Status: AC
Start: 2024-02-20 — End: ?
  Filled 2024-02-20: qty 10

## 2024-02-20 MED ORDER — MIDAZOLAM HCL 5 MG/5ML IJ SOLN
INTRAMUSCULAR | Status: DC | PRN
  Administered 2024-02-20 (×2): 1 mg via INTRAVENOUS

## 2024-02-20 MED ORDER — ACETAMINOPHEN 10 MG/ML IV SOLN
INTRAVENOUS | Status: DC | PRN
  Administered 2024-02-20: 1000 mg via INTRAVENOUS

## 2024-02-20 MED ORDER — PHENYLEPHRINE HCL-NACL 20-0.9 MG/250ML-% IV SOLN
INTRAVENOUS | Status: DC | PRN
  Administered 2024-02-20: 80 ug via INTRAVENOUS
  Administered 2024-02-20: 30 ug/min via INTRAVENOUS

## 2024-02-20 MED ORDER — FENTANYL CITRATE (PF) 100 MCG/2ML IJ SOLN: 25.0000 ug | INTRAMUSCULAR | Status: DC | PRN

## 2024-02-20 MED ORDER — SODIUM CHLORIDE 0.9 % IR SOLN
Status: DC | PRN
  Administered 2024-02-20: 250 mL

## 2024-02-20 MED ORDER — MORPHINE SULFATE (PF) 2 MG/ML IV SOLN: 0.5000 mg | INTRAVENOUS | Status: DC | PRN

## 2024-02-20 MED ORDER — SODIUM CHLORIDE (PF) 0.9 % IJ SOLN
INTRAMUSCULAR | Status: DC | PRN
  Administered 2024-02-20: 50 mL

## 2024-02-20 MED ORDER — CEFAZOLIN SODIUM-DEXTROSE 2-4 GM/100ML-% IV SOLN
2.0000 g | Freq: Four times a day (QID) | INTRAVENOUS | Status: AC
  Administered 2024-02-20 (×2): 2 g via INTRAVENOUS
  Filled 2024-02-20 (×2): qty 100

## 2024-02-20 MED ORDER — BUPIVACAINE-EPINEPHRINE (PF) 0.25% -1:200000 IJ SOLN
INTRAMUSCULAR | Status: AC
  Filled 2024-02-20: qty 30

## 2024-02-20 MED ORDER — ACETAMINOPHEN 10 MG/ML IV SOLN
INTRAVENOUS | Status: AC
  Filled 2024-02-20: qty 100

## 2024-02-20 MED ORDER — ALBUTEROL SULFATE (2.5 MG/3ML) 0.083% IN NEBU: 2.5000 mg | INHALATION_SOLUTION | Freq: Four times a day (QID) | RESPIRATORY_TRACT | Status: DC | PRN

## 2024-02-20 MED ORDER — MENTHOL 3 MG MT LOZG: 1.0000 | LOZENGE | OROMUCOSAL | Status: DC | PRN

## 2024-02-20 MED ORDER — OXYCODONE HCL 5 MG/5ML PO SOLN: 5.0000 mg | Freq: Once | ORAL | Status: DC | PRN

## 2024-02-20 MED ORDER — ONDANSETRON HCL 4 MG/2ML IJ SOLN
INTRAMUSCULAR | Status: DC | PRN
  Administered 2024-02-20: 4 mg via INTRAVENOUS

## 2024-02-20 MED ORDER — ONDANSETRON HCL 4 MG/2ML IJ SOLN
INTRAMUSCULAR | Status: AC
  Filled 2024-02-20: qty 2

## 2024-02-20 MED ORDER — KETAMINE HCL 50 MG/5ML IJ SOSY
PREFILLED_SYRINGE | INTRAMUSCULAR | Status: AC
  Filled 2024-02-20: qty 5

## 2024-02-20 MED ORDER — PHENOL 1.4 % MT LIQD: 1.0000 | OROMUCOSAL | Status: DC | PRN

## 2024-02-20 MED ORDER — 0.9 % SODIUM CHLORIDE (POUR BTL) OPTIME
TOPICAL | Status: DC | PRN
  Administered 2024-02-20: 500 mL

## 2024-02-20 MED ORDER — KETOROLAC TROMETHAMINE 15 MG/ML IJ SOLN
7.5000 mg | Freq: Four times a day (QID) | INTRAMUSCULAR | Status: AC
  Administered 2024-02-20 – 2024-02-21 (×4): 7.5 mg via INTRAVENOUS
  Filled 2024-02-20 (×4): qty 1

## 2024-02-20 MED ORDER — SODIUM CHLORIDE (PF) 0.9 % IJ SOLN
INTRAMUSCULAR | Status: AC
  Filled 2024-02-20: qty 10

## 2024-02-20 MED ORDER — LOSARTAN POTASSIUM 50 MG PO TABS
25.0000 mg | ORAL_TABLET | Freq: Every day | ORAL | Status: DC
  Administered 2024-02-20 – 2024-02-21 (×2): 25 mg via ORAL
  Filled 2024-02-20 (×2): qty 1

## 2024-02-20 MED ORDER — KETAMINE HCL 50 MG/5ML IJ SOSY
PREFILLED_SYRINGE | INTRAMUSCULAR | Status: DC | PRN
  Administered 2024-02-20 (×5): 10 mg via INTRAVENOUS

## 2024-02-20 MED ORDER — FENTANYL CITRATE (PF) 100 MCG/2ML IJ SOLN
INTRAMUSCULAR | Status: DC | PRN
  Administered 2024-02-20: 50 ug via INTRAVENOUS

## 2024-02-20 MED ORDER — HYDROCODONE-ACETAMINOPHEN 5-325 MG PO TABS
1.0000 | ORAL_TABLET | ORAL | Status: DC | PRN
Start: 2024-02-20 — End: 2024-02-21

## 2024-02-20 MED ORDER — SURGIPHOR WOUND IRRIGATION SYSTEM - OPTIME: TOPICAL | Status: DC | PRN

## 2024-02-20 MED ORDER — ONDANSETRON HCL 4 MG PO TABS: 4.0000 mg | ORAL_TABLET | Freq: Four times a day (QID) | ORAL | Status: DC | PRN

## 2024-02-20 MED ORDER — FENTANYL CITRATE (PF) 100 MCG/2ML IJ SOLN
INTRAMUSCULAR | Status: AC
  Filled 2024-02-20: qty 2

## 2024-02-20 MED ORDER — ONDANSETRON HCL 4 MG/2ML IJ SOLN: 4.0000 mg | Freq: Four times a day (QID) | INTRAMUSCULAR | Status: DC | PRN

## 2024-02-20 MED ORDER — GLYCOPYRROLATE 0.2 MG/ML IJ SOLN
INTRAMUSCULAR | Status: AC
  Filled 2024-02-20: qty 1

## 2024-02-20 SURGICAL SUPPLY — 60 items
BLADE CLIPPER SURG (BLADE) IMPLANT
BLADE SAGITTAL AGGR TOOTH XLG (BLADE) ×1 IMPLANT
BNDG COHESIVE 6X5 TAN ST LF (GAUZE/BANDAGES/DRESSINGS) ×2 IMPLANT
BRUSH SCRUB EZ PLAIN DRY (MISCELLANEOUS) ×1 IMPLANT
CHLORAPREP W/TINT 26 (MISCELLANEOUS) ×1 IMPLANT
DERMABOND ADVANCED .7 DNX12 (GAUZE/BANDAGES/DRESSINGS) ×1 IMPLANT
DRAPE C-ARM XRAY 36X54 (DRAPES) ×1 IMPLANT
DRAPE SHEET LG 3/4 BI-LAMINATE (DRAPES) ×3 IMPLANT
DRAPE TABLE BACK 80X90 (DRAPES) ×1 IMPLANT
DRSG MEPILEX SACRM 8.7X9.8 (GAUZE/BANDAGES/DRESSINGS) ×1 IMPLANT
DRSG OPSITE POSTOP 4X6 (GAUZE/BANDAGES/DRESSINGS) IMPLANT
DRSG OPSITE POSTOP 4X8 (GAUZE/BANDAGES/DRESSINGS) ×1 IMPLANT
ELECTRODE BLDE 4.0 EZ CLN MEGD (MISCELLANEOUS) ×1 IMPLANT
ELECTRODE REM PT RTRN 9FT ADLT (ELECTROSURGICAL) ×1 IMPLANT
GLOVE BIO SURGEON STRL SZ8 (GLOVE) ×1 IMPLANT
GLOVE BIOGEL PI IND STRL 8 (GLOVE) ×1 IMPLANT
GLOVE PI ORTHO PRO STRL 7.5 (GLOVE) ×2 IMPLANT
GLOVE PI ORTHO PRO STRL SZ8 (GLOVE) ×2 IMPLANT
GLOVE SURG SYN 7.5 E (GLOVE) ×1 IMPLANT
GLOVE SURG SYN 7.5 PF PI (GLOVE) ×1 IMPLANT
GOWN SRG XL LVL 3 NONREINFORCE (GOWNS) ×1 IMPLANT
GOWN STRL REUS W/ TWL LRG LVL3 (GOWN DISPOSABLE) ×1 IMPLANT
GOWN STRL REUS W/ TWL XL LVL3 (GOWN DISPOSABLE) ×1 IMPLANT
HEAD BIOLOX HIP 36/-2.5 (Joint) IMPLANT
HOOD PEEL AWAY T7 (MISCELLANEOUS) ×2 IMPLANT
INSERT TRIDENT POLY 36 0DEG (Insert) IMPLANT
IV NS 100ML SINGLE PACK (IV SOLUTION) ×1 IMPLANT
KIT PATIENT CARE HANA TABLE (KITS) ×1 IMPLANT
KIT TURNOVER CYSTO (KITS) ×1 IMPLANT
LIGHT WAVEGUIDE WIDE FLAT (MISCELLANEOUS) ×1 IMPLANT
MANIFOLD NEPTUNE II (INSTRUMENTS) ×1 IMPLANT
MARKER SKIN DUAL TIP RULER LAB (MISCELLANEOUS) ×1 IMPLANT
MAT ABSORB FLUID 56X50 GRAY (MISCELLANEOUS) ×1 IMPLANT
NDL SPNL 20GX3.5 QUINCKE YW (NEEDLE) ×1 IMPLANT
NEEDLE SPNL 20GX3.5 QUINCKE YW (NEEDLE) ×1 IMPLANT
NS IRRIG 500ML POUR BTL (IV SOLUTION) ×1 IMPLANT
PACK HIP COMPR (MISCELLANEOUS) ×1 IMPLANT
PAD ARMBOARD POSITIONER FOAM (MISCELLANEOUS) ×1 IMPLANT
PENCIL SMOKE EVACUATOR (MISCELLANEOUS) ×1 IMPLANT
SCREW HEX LP 6.5X20 (Screw) IMPLANT
SHELL ACETABUL CLUSTER SZ 54 (Shell) IMPLANT
SLEEVE SCD COMPRESS KNEE MED (STOCKING) ×1 IMPLANT
SOLUTION IRRIG SURGIPHOR (IV SOLUTION) ×1 IMPLANT
STEM HIGH OFFSET SZ4 36X103 (Stem) IMPLANT
SURGIFLO W/THROMBIN 8M KIT (HEMOSTASIS) IMPLANT
SUT BONE WAX W31G (SUTURE) ×1 IMPLANT
SUT ETHIBOND 2 V 37 (SUTURE) ×1 IMPLANT
SUT SILK 0 30XBRD TIE 6 (SUTURE) ×1 IMPLANT
SUT STRATAFIX 14 PDO 36 VLT (SUTURE) ×1 IMPLANT
SUT STRATAFIX PDO 1 14 VIOLET (SUTURE) ×1 IMPLANT
SUT VIC AB 0 CT1 36 (SUTURE) ×1 IMPLANT
SUT VIC AB 2-0 CT2 27 (SUTURE) ×1 IMPLANT
SUTURE STRATA 1 CT-1 DLB (SUTURE) ×1 IMPLANT
SUTURE STRATA SPIR 4-0 18 (SUTURE) ×1 IMPLANT
SYR 20ML LL LF (SYRINGE) ×2 IMPLANT
TAPE MICROFOAM 4IN (TAPE) IMPLANT
TOWEL OR 17X26 4PK STRL BLUE (TOWEL DISPOSABLE) IMPLANT
TRAP FLUID SMOKE EVACUATOR (MISCELLANEOUS) ×1 IMPLANT
WAND WEREWOLF FASTSEAL 6.0 (MISCELLANEOUS) ×1 IMPLANT
WATER STERILE IRR 1000ML POUR (IV SOLUTION) ×1 IMPLANT

## 2024-02-20 NOTE — H&P (Signed)
 History of Present Illness: Donald [name] is an 73 y.o. male presents for follow-up evaluation of his left hip prior to planned left total hip replacement. Patient has had ongoing severely worsening left hip groin and lateral hip pain with severe associated stiffness and catching. Pain has been up to a 10 out of 10 despite treatment with Celebrex  physical therapy and other conservative treatments. He had an anesthetic injection and had more 90% improvement in pain and function of his hip after the shot. He is here today to review preoperative indications with the plan to move forward with left anterior total replacement. The patient denies fevers, chills, numbness, tingling, shortness of breath, chest pain, recent illness, or any trauma.  Patient is a non-smoker well-controlled diabetic with an A1c of 6.0 and a BMI of 35  Past Medical History: Past Medical History:  Diagnosis Date  Coronary artery disease involving native coronary artery of native heart without angina pectoris 07/26/2022  Cyst of right kidney  Degenerative arthritis of knee  Erectile dysfunction  Essential hypertension 09/22/2022  Glucose intolerance (impaired glucose tolerance)  History of cancer 04/03/2021  Melonomia  History of cataract  Hyperlipidemia  mild  Hyperplastic colon polyp 08/16/2016  Obesity  RAD (reactive airway disease), mild intermittent, uncomplicated ([facility])  Rectal polyp  benign  Serrated adenoma of colon 08/16/2016   Past Surgical History: Past Surgical History:  Procedure Laterality Date  Left inguinal hernia 1972  Right ankle fracture repair 03/09/1975  Disc surgery 1984  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 Aubry Blase  Right total knee arthroplasty 10/01/2013  Dr. 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  Dr Aubry Blase  COLONOSCOPY 02/22/2022   (4)Tubular adenomas/PHx CP/Repeat 81yrs/SMR  Colonoscopy with benign polyp removed  FRACTURE SURGERY 03/09/1975  KNEE ARTHROSCOPY  PROSTATE SURGERY 12/08/2020  [facility]   Past Family History: Family History  Problem Relation Age of Onset  Alzheimer's disease Mother  Dementia Mother  Heart failure Father  Coronary Artery Disease (Blocked arteries around heart) Father  High blood pressure (Hypertension) Father  Diabetes type II Father   Medications: Current Outpatient Medications  Medication Sig Dispense Refill  albuterol MDI, PROVENTIL, VENTOLIN, PROAIR, HFA 90 mcg/actuation inhaler Inhale 2 inhalations into the lungs once daily as needed for Wheezing for up to 90 days 18 g 12  aspirin 325 MG tablet Take 325 mg by mouth once daily  BD LUER-LOK SYRINGE 3 mL 18 x 1 1/2" Syrg every 14 (fourteen) days  BD REGULAR BEVEL NEEDLES 21 gauge x 1 1/2" Ndle every 14 (fourteen) days  cyanocobalamin  (VITAMIN B12) 500 MCG tablet Take 500 mcg by mouth once daily  fluticasone  propionate (FLONASE ) 50 mcg/actuation nasal spray Place 1 spray into both nostrils 2 (two) times daily as needed  ketoconazole  (NIZORAL ) 2 % cream Apply 1 Application topically once daily  Lactobacillus acidophilus (PROBIOTIC) 10 billion cell Cap Take 1 capsule by mouth once daily  losartan (COZAAR) 25 MG tablet Take 1 tablet by mouth once daily 90 tablet 0  multivitamin tablet Take 1 tablet by mouth once daily.  promethazine-dextromethorphan (PROMETHAZINE-DM) 6.25-15 mg/5 mL syrup Take 5 mLs by mouth every 6 (six) hours as needed 120 mL 0  sildenafil  (REVATIO ) 20 mg tablet Take 20 mg by mouth once daily as needed.  syringe, disposable, 3 mL Syrg 1 mg  tadalafiL  (CIALIS ) 5 MG tablet Take 5 mg by mouth once daily  TADALAFIL   ORAL Take 1 tablet by mouth once daily  testosterone  cypionate (DEPO-TESTOSTERONE ) 200 mg/mL injection Inject 100 mg into the muscle every 14 (fourteen) days  TURMERIC ORAL Take 2,000 mg by mouth once daily   vitamin B complex (B COMPLEX-VITAMIN B12 ORAL) Take 1 tablet by mouth once daily  zinc gluconate 30 mg Tab Take 30 mg by mouth once daily   No current facility-administered medications for this visit.   Allergies: No Known Allergies   Visit Vitals: Vitals:  02/15/24 1402  BP: (!) 150/70    Review of Systems:  A comprehensive 14 point ROS was performed, reviewed, and the pertinent orthopaedic findings are documented in the HPI.  Physical Exam: Body mass index is 35.8 kg/m. General/Constitutional: No apparent distress: well-nourished and well developed. Lymphatic: No palpable adenopathy. Pulmonary exam: Lungs clear to auscultation bilaterally no wheezing rales or rhonchi Cardiac exam: Regular rate and rhythm no obvious murmurs rubs or gallops. Vascular: No edema, swelling or tenderness, except as noted in detailed exam. Integumentary: No impressive skin lesions present, except as noted in detailed exam. Neuro/Psych: Normal mood and affect, oriented to person, place and time. Musculoskeletal: Normal, except as noted in detailed exam and in HPI.  Left hip exam  SKIN: intact SWELLING: none WARMTH: no warmth TENDERNESS: none, Stinchfield Positive ROM: 0 degrees internal rotation and 20 degrees external rotation and pain with internal rotation,; Hip Flexion 75 STRENGTH: limited by pain GAIT: antalgic STABILITY: stable to testing CREPITUS: yes LEG LENGTH DISCREPANCY: none NEUROLOGICAL EXAM: normal VASCULAR EXAM: normal LUMBAR SPINE: tenderness: no straight leg raising sign: no motor exam: normal  The contralateral hip was examined for comparison and it showed: TENDERNESS: none ROM: normal and full STRENGTH: normal STABILITY: stable to testing  Hip Imaging :  I reviewed AP pelvis and lateral left hip x-rays performed on 01/03/2024 images reviewed by myself. The patient has severe left hip degenerative changes with bone-on-bone articulation, osteophyte formation, femoral  head deformity with a cam lesion sclerosis and early cystic changes in the femoral head. The right hip is status post total hip arthroplasty with components in appropriate position with no evidence of periprosthetic fracture or loosening. There are some surgical clips noted within the pelvic brim.  Assessment:  Left hip osteoarthritis  Plan: Donald [name] is a 73 year old male who presents with left hip bone on bone arthritis with a positive response to a lidocaine  challenge test. Based upon the patient's continued symptoms and failure to respond to conservative treatment, I have recommended a left total hip replacement for this patient. A long discussion took place with the patient describing what a total joint replacement is and what the procedure would entail. A hip model, similar to the implants that will be used during the operation, was utilized to demonstrate the implants. Choices of implant manufactures were discussed and reviewed. The ability to secure the implant utilizing cement or cementless (press fit) fixation was discussed. Anterior and posterior exposures were discussed. For this patient an appropriate approach will be anterior.   The hospitalization and post-operative care and rehabilitation were also discussed. The use of perioperative antibiotics and DVT prophylaxis were discussed. The risk, benefits and alternatives to a surgical intervention were discussed at length with the patient. The patient was also advised of risks related to the medical comorbidities and elevated body mass index (BMI). A lengthy discussion took place to review the most common complications including but not limited to: deep vein thrombosis, pulmonary embolus, heart attack, stroke, infection, wound breakdown, heterotopic ossification,  dislocation, numbness, leg length in-equality, intraoperative fracture, damage to nerves, tendon,muscles, arteries or other blood vessels, death and other possible complications from  anesthesia. The patient was told that we will take steps to minimize these risks by using sterile technique, antibiotics and DVT prophylaxis when appropriate and follow the patient postoperatively in the office setting to monitor progress. The possibility of recurrent pain, no improvement in pain and actual worsening of pain were also discussed with the patient. The risk of dislocation following total hip replacement was discussed and potential precautions to prevent dislocation were reviewed.   Patient asked about and confirms no history of any reactions to metal or metal allergy in the past.  The discharge plan of care focused on the patient going home following surgery. The patient was encouraged to make the necessary arrangements to have someone stay with them when they are discharged home.   The benefits of surgery were discussed with the patient including the potential for improving the patient's current clinical condition through operative intervention. Alternatives to surgical intervention including continued conservative management were also discussed in detail. All questions were answered to the satisfaction of the patient. The patient participated and agreed to the plan of care as well as the use of the recommended implants for their total hip replacement surgery. An information packet was given to the patient to review prior to surgery.   Patient received clearance for surgery. All questions answered patient agrees with above plan for a left anterior total replacement   Portions of this record have been created using Dragon dictation software. Dictation errors have been sought, but may not have been identified and corrected.  Venus Ginsberg MD

## 2024-02-20 NOTE — Anesthesia Preprocedure Evaluation (Signed)
 Anesthesia Evaluation  Patient identified by MRN, date of birth, ID band Patient awake    Reviewed: Allergy & Precautions, H&P , NPO status , Patient's Chart, lab work & pertinent test results  History of Anesthesia Complications (+) PONV and history of anesthetic complications  Airway Mallampati: III  TM Distance: >3 FB Neck ROM: Full    Dental no notable dental hx.    Pulmonary neg pulmonary ROS, former smoker   Pulmonary exam normal breath sounds clear to auscultation       Cardiovascular hypertension, negative cardio ROS Normal cardiovascular exam Rhythm:Regular Rate:Normal     Neuro/Psych negative neurological ROS  negative psych ROS   GI/Hepatic negative GI ROS, Neg liver ROS,,,  Endo/Other  negative endocrine ROS    Renal/GU      Musculoskeletal   Abdominal   Peds  Hematology negative hematology ROS (+)   Anesthesia Other Findings Arthritis  Hyperlipidemia Hx of basal cell carcinoma PONV (postoperative nausea and vomiting)  ED (erectile dysfunction) Lung nodule  Hx COVID-19 Melanoma (HCC)  Pre-diabetes Actinic keratosis  Lumbar stenosis with neurogenic claudication History of right foot drop      Reproductive/Obstetrics negative OB ROS                             Anesthesia Physical Anesthesia Plan  ASA: 2  Anesthesia Plan: Spinal   Post-op Pain Management:    Induction:   PONV Risk Score and Plan: 2 and Ondansetron , Dexamethasone , Propofol  infusion, TIVA and Midazolam   Airway Management Planned: Natural Airway and Nasal Cannula  Additional Equipment:   Intra-op Plan:   Post-operative Plan:   Informed Consent: I have reviewed the patients History and Physical, chart, labs and discussed the procedure including the risks, benefits and alternatives for the proposed anesthesia with the patient or authorized representative who has indicated his/her  understanding and acceptance.     Dental Advisory Given  Plan Discussed with: Anesthesiologist, CRNA and Surgeon  Anesthesia Plan Comments: (Patient reports no bleeding problems and no anticoagulant use.  Plan for spinal with backup GA  Patient consented for risks of anesthesia including but not limited to:  - adverse reactions to medications - damage to eyes, teeth, lips or other oral mucosa - nerve damage due to positioning  - risk of bleeding, infection and or nerve damage from spinal that could lead to paralysis - risk of headache or failed spinal - damage to teeth, lips or other oral mucosa - sore throat or hoarseness - damage to heart, brain, nerves, lungs, other parts of body or loss of life  Patient voiced understanding and assent.)       Anesthesia Quick Evaluation

## 2024-02-20 NOTE — Op Note (Signed)
 Patient Name: Krikor Reeck  ZOX:[id number]  Pre-Operative Diagnosis: Left hip Osteoarthritis  Post-Operative Diagnosis: (same)  Procedure: Left Total Hip Arthroplasty  Components/Implants: Cup: Trident Tritanium Clusterhole 54/E w/x 2 screws    Liner: Neutral X3 poly 36/E  Stem: Insignia #4 high offset   Head:Biolox ceramic 36 -2.2mm  Date of Surgery: 02/20/2024  Surgeon: Venus Ginsberg MD  Assistant: Francenia Ingle PA (present and scrubbed throughout the case, critical for assistance with exposure, retraction, instrumentation, and closure)   Anesthesiologist: Duayne Gey  Anesthesia: Spinal   EBL: 150cc  IVF:800cc  Complications: None   Brief history: The patient is a 73 year old male with a history of osteoarthritis of the left hip with pain limiting their range of motion and activities of daily living, which has failed multiple attempts at conservative therapy.  The risks and benefits of total hip arthroplasty as definitive surgical treatment were discussed with the patient, who opted to proceed with the operation.  After outpatient medical clearance and optimization was completed the patient was admitted to [facility] for the procedure.  All preoperative films were reviewed and an appropriate surgical plan was made prior to surgery.   Description of procedure: The patient was brought to the operating room where laterality was confirmed by all those present to be the left side.  The patient was administered spinal anesthesia on a stretcher prior to being moved supine on the operating room table. Patient was given an intravenous dose of antibiotics for surgical prophylaxis and TXA.  All bony prominences and extremities were well padded and the patient was securely attached to the table boots, a perineal post was placed and the patient had a safety strap placed.  Surgical site was prepped with alcohol and chlorhexidine . The surgical site over the hip was and draped in  typical sterile fashion with multiple layers of adhesive and nonadhesive drapes.  The incision site was marked out with a sterile marker and care was taken to assess the position of the ASIS and ensure appropriate position for the incision.    A surgical timeout was then called with participation of all staff in the room the patient was then a confirmed again and laterality confirmed.  Incision was made over the anterior lateral aspect of the proximal thigh in line with the TFL.  Appropriate retractors were placed and all bleeding vessels were coagulated within the subcutaneous and fatty layers.  An incision was made in the TFL fascia in the interval was carefully identified.  The lateral ascending branches of the circumflex vessels were identified, cauterized and carefully dissected. The main vessels were then tied with a 0 silk hand tie.  Retractors were placed around the superior lateral and inferior medial aspects of the femoral neck and a capsulotomy was performed exposing the hip joint.  Retraction stitches were placed and the capsulotomy to assist with visualization.  Femoral neck cut was then made and the femoral head was extracted after placing the leg in traction.  Bone wax was then applied to the proximal cut surface of the femur and aqua mantis was used to address any bleeding around the femoral neck cut.  Retractors were then placed around the acetabulum to fully visualize the joint space, and the remaining labral tissue was removed and pulvinar was removed.   The acetabulum was then sequentially reamed up to the appropriate size in order to get good fit and fill for the acetabular component while under fluoroscopic guidance.  Acetabular component was then placed and malleted  into a secure fit while confirming position and abduction angle and anteversion utilizing fluoroscopy.  2 screws were then placed in the acetabular cup to assist in securing the cup in place. The cup was irrigated,  a real  neutral liner was placed, impacted, and checked for stability. The femur traction was dropped and sequentially externally rotated while performing a release of the posterior and superomedial tissues off of the proximal femur to allow for mobility, care was taken to preserve the external rotators and piriformis attachments.  The remaining interval between the abductors and the capsule was dissected out and a retractor was placed over the superolateral aspect of the femur over the greater trochanter.  The leg was carefully brought down into extension and adducted to provide visualization of the proximal femur for broaching.  The femur was then sequentially broached up to an appropriate size which provided for good fill and stability to the femoral broach.  A trial neck and head were placed on the femoral broach and the leg was brought up for reduction.  The hip was reduced and manual check of stability was performed.  The hip was found to be stable in flexion internal rotation and extension external rotation.  Leg lengths were confirmed on fluoroscopy.   The hip was then dislocated the trial neck and head were removed.  The leg was then brought down into extension and adduction in the proximal femur was reexposed.  The broach trial was removed and the femur was irrigated with normal saline prior to the real femoral stem being implanted.  After the femoral stem was seated and shown to have good fit and fill the appropriate head was impacted the leg was brought up and reduced.  There was good range of motion with stability in flexion internal rotation and extension external rotation on testing.  Leg lengths were found to be appropriate on fluoroscopic evaluation at this time.  The hip was then irrigated with betdine based surgiphor solution and then saline solution.  The capsulotomy was repaired with Ethibond sutures.  A pericapsular and peritrochanteric cocktail with Exparel  and bupivacaine  was then injected as well  as the subcutaneous tissues. The fascia was closed with a #1 barbed running suture.  The deep tissues were closed with Vicryl sutures the subcutaneous tissues were closed with interrupted Vicryl sutures and a running barbed 4-0 suture.  The skin was then reinforced with Dermabond and a sterile dressing was placed.   The patient was awoken from anesthesia transferred off of the operating room table onto a hospital bed where examination of leg lengths found the leg lengths to be equal with a good distal pulse.  The patient was then transferred to the PACU in stable condition.

## 2024-02-20 NOTE — Interval H&P Note (Signed)
Patient history and physical updated. Consent reviewed including risks, benefits, and alternatives to surgery. Patient agrees with above plan to proceed with left anterior total hip arthroplasty  

## 2024-02-20 NOTE — Anesthesia Procedure Notes (Addendum)
 Spinal  Patient location during procedure: OR Start time: 02/20/2024 7:37 AM End time: 02/20/2024 7:42 AM Reason for block: surgical anesthesia Staffing Performed: resident/CRNA  Anesthesiologist: Enrique Harvest, MD Resident/CRNA: Juanda Noon, CRNA Performed by: Juanda Noon, CRNA Authorized by: Enrique Harvest, MD   Preanesthetic Checklist Completed: patient identified, IV checked, site marked, risks and benefits discussed, surgical consent, monitors and equipment checked, pre-op evaluation and timeout performed Spinal Block Patient position: sitting Prep: Betadine  Patient monitoring: heart rate, continuous pulse ox, blood pressure and cardiac monitor Approach: midline Location: L4-5 Injection technique: single-shot Needle Needle type: Introducer and Pencan  Needle gauge: 24 G Needle length: 10 cm Assessment Sensory level: T4 Events: CSF return Additional Notes Negative paresthesia. Negative blood return. Positive free-flowing CSF. Expiration date of kit checked and confirmed. Patient tolerated procedure well, without complications.

## 2024-02-20 NOTE — Evaluation (Signed)
 Physical Therapy Evaluation Patient Details Name: Donald [name] MRN: [id number] DOB: 1950-11-24 Today's Date: 02/20/2024  History of Present Illness  Patient is a 73 year old male with left hip osteoarthritis s/p left total hip arthroplasty. History of bilateral TKA, R THA.  Clinical Impression  Patient is agreeable to PT evaluation. He is eager to get out of bed. He is independent without assistive device with activity with left hip pain with mobility at baseline. He lives with his spouse.  Today the patient is supervision for bed mobility and transfers. Gait training initiated in hallway with reinforcement of rolling walker placement. Patient reports 4/10 pain in the left hip with movement. He is hopeful to return home soon with family support. Recommend to continue PT to maximize independence and facilitate return to prior level of function.       If plan is discharge home, recommend the following: Assistance with cooking/housework   Can travel by private vehicle        Equipment Recommendations None recommended by PT  Recommendations for Other Services       Functional Status Assessment Patient has had a recent decline in their functional status and demonstrates the ability to make significant improvements in function in a reasonable and predictable amount of time.     Precautions / Restrictions Precautions Precautions: Anterior Hip;Fall Precaution Booklet Issued: Yes (comment) Recall of Precautions/Restrictions: Intact Restrictions Weight Bearing Restrictions Per Provider Order: Yes LLE Weight Bearing Per Provider Order: Weight bearing as tolerated      Mobility  Bed Mobility Overal bed mobility: Needs Assistance Bed Mobility: Supine to Sit, Sit to Supine     Supine to sit: Supervision Sit to supine: Supervision   General bed mobility comments: verbal cues for technique    Transfers Overall transfer level: Needs assistance Equipment used: Rolling walker (2  wheels) Transfers: Sit to/from Stand Sit to Stand: Supervision           General transfer comment: 2 standing bouts performed with no physical asistance required    Ambulation/Gait Ambulation/Gait assistance: Contact guard assist Gait Distance (Feet): 100 Feet Assistive device: Rolling walker (2 wheels) Gait Pattern/deviations: Step-to pattern, Decreased stance time - left, Trunk flexed Gait velocity: decreased     General Gait Details: reinforcement of proper use and positioning of rolling walker. no dizziness reported with upright activity  Stairs            Wheelchair Mobility     Tilt Bed    Modified Rankin (Stroke Patients Only)       Balance Overall balance assessment: Needs assistance Sitting-balance support: Feet supported Sitting balance-Leahy Scale: Fair     Standing balance support: Single extremity supported Standing balance-Leahy Scale: Fair Standing balance comment: patient able to hold the urinal with RUE and LUE support on rolling walker with no loss of balance                             Pertinent Vitals/Pain Pain Assessment Pain Assessment: 0-10 Pain Score: 4  Pain Location: L posterior buttock pain Pain Descriptors / Indicators: Discomfort Pain Intervention(s): Limited activity within patient's tolerance, Monitored during session, Repositioned, Ice applied    Home Living Family/patient expects to be discharged to:: Private residence Living Arrangements: Spouse/significant other Available Help at Discharge: Family;Available 24 hours/day Type of Home: House Home Access: Stairs to enter Entrance Stairs-Rails: Right Entrance Stairs-Number of Steps: 3 Alternate Level Stairs-Number of Steps: flight Home Layout:  Two level;1/2 bath on main level;Bed/bath upstairs Home Equipment: Rolling Walker (2 wheels);Shower seat;BSC/3in1      Prior Function Prior Level of Function : Independent/Modified Independent              Mobility Comments: independent without device ADLs Comments: independent     Extremity/Trunk Assessment   Upper Extremity Assessment Upper Extremity Assessment: Overall WFL for tasks assessed    Lower Extremity Assessment Lower Extremity Assessment: LLE deficits/detail LLE Deficits / Details: standing without knee buckling. LLE Sensation: WNL       Communication   Communication Communication: No apparent difficulties    Cognition Arousal: Alert Behavior During Therapy: WFL for tasks assessed/performed   PT - Cognitive impairments: No apparent impairments                         Following commands: Intact       Cueing Cueing Techniques: Verbal cues     General Comments      Exercises     Assessment/Plan    PT Assessment Patient needs continued PT services  PT Problem List Decreased strength;Decreased range of motion;Decreased activity tolerance;Decreased balance;Decreased mobility       PT Treatment Interventions DME instruction;Gait training;Stair training;Functional mobility training;Therapeutic activities;Therapeutic exercise;Balance training;Patient/family education    PT Goals (Current goals can be found in the Care Plan section)  Acute Rehab PT Goals Patient Stated Goal: to go home PT Goal Formulation: With patient Time For Goal Achievement: 03/05/24 Potential to Achieve Goals: Good    Frequency BID     Co-evaluation               AM-PAC PT "6 Clicks" Mobility  Outcome Measure Help needed turning from your back to your side while in a flat bed without using bedrails?: None Help needed moving from lying on your back to sitting on the side of a flat bed without using bedrails?: A Little Help needed moving to and from a bed to a chair (including a wheelchair)?: A Little Help needed standing up from a chair using your arms (e.g., wheelchair or bedside chair)?: A Little Help needed to walk in hospital room?: A Little Help needed  climbing 3-5 steps with a railing? : A Little 6 Click Score: 19    End of Session Equipment Utilized During Treatment: Gait belt Activity Tolerance: Patient tolerated treatment well Patient left: in bed;with call bell/phone within reach;with SCD's reapplied;with family/visitor present (ice pack applied L hip) Nurse Communication: Mobility status PT Visit Diagnosis: Difficulty in walking, not elsewhere classified (R26.2);Other abnormalities of gait and mobility (R26.89)    Time: 1610-9604 PT Time Calculation (min) (ACUTE ONLY): 33 min   Charges:   PT Evaluation $PT Eval Low Complexity: 1 Low PT Treatments $Gait Training: 8-22 mins PT General Charges $$ ACUTE PT VISIT: 1 Visit         Ozie Bo, PT, MPT   Erlene Hawks 02/20/2024, 2:00 PM

## 2024-02-20 NOTE — Plan of Care (Signed)
  Problem: Activity: Goal: Ability to avoid complications of mobility impairment will improve Outcome: Progressing Goal: Ability to tolerate increased activity will improve Outcome: Progressing   Problem: Clinical Measurements: Goal: Postoperative complications will be avoided or minimized Outcome: Progressing   Problem: Pain Management: Goal: Pain level will decrease with appropriate interventions Outcome: Progressing   Problem: Skin Integrity: Goal: Will show signs of wound healing Outcome: Progressing   

## 2024-02-20 NOTE — Transfer of Care (Signed)
 Immediate Anesthesia Transfer of Care Note  Patient: Donald [name]  Procedure(s) Performed: ARTHROPLASTY, HIP, TOTAL, ANTERIOR APPROACH (Left: Hip)  Patient Location: PACU  Anesthesia Type:Spinal  Level of Consciousness: awake  Airway & Oxygen Therapy: Patient Spontanous Breathing and Patient connected to face mask oxygen  Post-op Assessment: Report given to RN and Post -op Vital signs reviewed and stable  Post vital signs: Reviewed and stable  Last Vitals:  Vitals Value Taken Time  BP 108/63 02/20/24 1011  Temp    Pulse 79 02/20/24 1015  Resp 10 02/20/24 1012  SpO2 99 % 02/20/24 1015  Vitals shown include unfiled device data.  Last Pain:  Vitals:   02/20/24 0617  TempSrc: Temporal  PainSc: 0-No pain         Complications: No notable events documented.

## 2024-02-21 ENCOUNTER — Encounter: Payer: Self-pay | Admitting: Orthopedic Surgery

## 2024-02-21 DIAGNOSIS — M1612 Unilateral primary osteoarthritis, left hip: Secondary | ICD-10-CM | POA: Diagnosis not present

## 2024-02-21 LAB — BASIC METABOLIC PANEL WITH GFR
Anion gap: 3 — ABNORMAL LOW (ref 5–15)
BUN: 16 mg/dL (ref 8–23)
CO2: 20 mmol/L — ABNORMAL LOW (ref 22–32)
Calcium: 7.8 mg/dL — ABNORMAL LOW (ref 8.9–10.3)
Chloride: 105 mmol/L (ref 98–111)
Creatinine, Ser: 1.09 mg/dL (ref 0.61–1.24)
GFR, Estimated: >60 mL/min (ref >60)
Glucose, Bld: 181 mg/dL — ABNORMAL HIGH (ref 70–99)
Potassium: 4.2 mmol/L (ref 3.5–5.1)
Sodium: 128 mmol/L — ABNORMAL LOW (ref 135–145)

## 2024-02-21 LAB — CBC
HCT: 41.3 % (ref 39.0–52.0)
Hemoglobin: 13.9 g/dL (ref 13.0–17.0)
MCH: 28.3 pg (ref 26.0–34.0)
MCHC: 33.7 g/dL (ref 30.0–36.0)
MCV: 83.9 fL (ref 80.0–100.0)
Platelets: 292 10*3/uL (ref 150–400)
RBC: 4.92 MIL/uL (ref 4.22–5.81)
RDW: 14.8 % (ref 11.5–15.5)
WBC: 15 10*3/uL — ABNORMAL HIGH (ref 4.0–10.5)
nRBC: 0 % (ref 0.0–0.2)

## 2024-02-21 MED ORDER — ENOXAPARIN SODIUM 40 MG/0.4ML IJ SOSY
40.0000 mg | PREFILLED_SYRINGE | INTRAMUSCULAR | 0 refills | Status: DC
Start: 2024-02-22 — End: 2024-03-22

## 2024-02-21 MED ORDER — CELECOXIB 200 MG PO CAPS: 200.0000 mg | ORAL_CAPSULE | Freq: Two times a day (BID) | ORAL | 0 refills | Status: AC

## 2024-02-21 MED ORDER — TRAMADOL HCL 50 MG PO TABS: 50.0000 mg | ORAL_TABLET | Freq: Four times a day (QID) | ORAL | 0 refills | Status: AC | PRN

## 2024-02-21 MED ORDER — ONDANSETRON HCL 4 MG PO TABS: 4.0000 mg | ORAL_TABLET | Freq: Four times a day (QID) | ORAL | 0 refills | Status: DC | PRN

## 2024-02-21 MED ORDER — ACETAMINOPHEN 500 MG PO TABS: 1000.0000 mg | ORAL_TABLET | Freq: Three times a day (TID) | ORAL | 0 refills | Status: AC

## 2024-02-21 MED ORDER — OXYCODONE HCL 5 MG PO TABS: 5.0000 mg | ORAL_TABLET | Freq: Three times a day (TID) | ORAL | 0 refills | Status: AC | PRN

## 2024-02-21 MED ORDER — DOCUSATE SODIUM 100 MG PO CAPS: 100.0000 mg | ORAL_CAPSULE | Freq: Two times a day (BID) | ORAL | 0 refills | Status: DC

## 2024-02-21 NOTE — Plan of Care (Signed)
   Problem: Activity: Goal: Ability to avoid complications of mobility impairment will improve Outcome: Progressing   Problem: Pain Management: Goal: Pain level will decrease with appropriate interventions Outcome: Progressing

## 2024-02-21 NOTE — Plan of Care (Signed)
  Problem: Pain Management: Goal: Pain level will decrease with appropriate interventions Outcome: Progressing   Problem: Activity: Goal: Risk for activity intolerance will decrease Outcome: Progressing   Problem: Pain Managment: Goal: General experience of comfort will improve and/or be controlled Outcome: Progressing

## 2024-02-21 NOTE — Progress Notes (Signed)
 Subjective: 1 Day Post-Op Procedure(s) (LRB): ARTHROPLASTY, HIP, TOTAL, ANTERIOR APPROACH (Left) Patient reports pain as mild.   Patient is well, and has had no acute complaints or problems Denies any CP, SOB, ABD pain. We will continue therapy today.  Plan is to go Home after hospital stay.  Objective: Vital signs in last 24 hours: Temp:  [97.3 F (36.3 C)-98.5 F (36.9 C)] 97.8 F (36.6 C) (04/29 0832) Pulse Rate:  [72-86] 74 (04/29 0400) Resp:  [11-19] 17 (04/29 0832) BP: (104-140)/(61-88) 135/61 (04/29 0832) SpO2:  [95 %-99 %] 96 % (04/29 0832)  Intake/Output from previous day: 04/28 0701 - 04/29 0700 In: 1000 [I.V.:700; IV Piggyback:300] Out: 3150 [Urine:3000; Blood:150] Intake/Output this shift: No intake/output data recorded.  Recent Labs    02/21/24 0625  HGB 13.9   Recent Labs    02/21/24 0625  WBC 15.0*  RBC 4.92  HCT 41.3  PLT 292   Recent Labs    02/21/24 0625  NA 128*  K 4.2  CL 105  CO2 20*  BUN 16  CREATININE 1.09  GLUCOSE 181*  CALCIUM 7.8*   No results for input(s): "LABPT", "INR" in the last 72 hours.  EXAM General - Patient is Alert, Appropriate, and Oriented Extremity - Neurovascular intact Sensation intact distally Intact pulses distally Dorsiflexion/Plantar flexion intact Dressing - dressing C/D/I Motor Function - intact, moving foot and toes well on exam.   Past Medical History:  Diagnosis Date   Actinic keratosis    Arthritis    Basal cell carcinoma (BCC) of LEFT lateral canthus 01/24/2017   Nodular pattern. Tx: EDC   Basal cell carcinoma (BCC) of left shoulder 02/23/2008   CAD (coronary artery disease)    COVID-19 06/2020   DDD (degenerative disc disease), lumbar    ED (erectile dysfunction)    a.) on PDE5i (tadalafil  + sildenafil )   Family history of adverse reaction to anesthesia    a.) stroke-like symptoms in 1st degree relative (father) following upper GI   Former smoker    History of bilateral cataract  extraction    History of right foot drop    Hyperlipidemia    Hyperplastic polyp of ascending colon    Hypertension    Long-term use of aspirin therapy    Lumbar stenosis with neurogenic claudication    Lung nodule    Male hypogonadism    a.) on TRT injections (testosterone  cypionate)   Melanoma (HCC) 02/25/2021   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, L med forehead, 03/04/21 LN2, Imiquimod ;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- MOHs completed 11/25/21   PONV (postoperative nausea and vomiting)    a.) years ago following hernia surgery   Pre-diabetes    Reactive airway disease    Rectal polyp (benign)    Renal cyst, right    Serrated adenoma of colon     Assessment/Plan:   1 Day Post-Op Procedure(s) (LRB): ARTHROPLASTY, HIP, TOTAL, ANTERIOR APPROACH (Left) Principal Problem:   S/P total left hip arthroplasty  Estimated body mass index is 35.8 kg/m as calculated from the following:   Height as of this encounter: 6' (1.829 m).   Weight as of this encounter: 119.7 kg. Advance diet Up with therapy Pain well controlled VSS Hyponatremia - mild, increase salt intake Care management to assist with discharge to home with HHPT  DVT Prophylaxis - Lovenox , TED hose, and SCDs Weight-Bearing as tolerated to left leg   T. Thomos Flies, PA-C [facility] 02/21/2024, 10:24 AM

## 2024-02-21 NOTE — Discharge Summary (Signed)
 Physician Discharge Summary  Patient ID: Donald [name] MRN: [id number] DOB/AGE: 74-Dec-1952 74 y.o.  Admit date: 02/20/2024 Discharge date: 02/21/2024  Admission Diagnoses:  Primary osteoarthritis of left hip [M16.12] S/P total left hip arthroplasty [W09.811]   Discharge Diagnoses: Patient Active Problem List   Diagnosis Date Noted   S/P total left hip arthroplasty 02/20/2024   S/P total hip arthroplasty 12/08/2016   Primary osteoarthritis of right hip 05/11/2016   Spondylosis of lumbar region without myelopathy or radiculopathy 05/11/2016   Status post total left knee replacement 11/17/2015   Status post total right knee replacement 11/17/2015   Bilateral leg edema 01/20/2015   Chronic low back pain 01/20/2015   Hyperglycemia, unspecified 04/04/2014   Obesity (BMI 30-39.9) 04/04/2014    Past Medical History:  Diagnosis Date   Actinic keratosis    Arthritis    Basal cell carcinoma (BCC) of LEFT lateral canthus 01/24/2017   Nodular pattern. Tx: EDC   Basal cell carcinoma (BCC) of left shoulder 02/23/2008   CAD (coronary artery disease)    COVID-19 06/2020   DDD (degenerative disc disease), lumbar    ED (erectile dysfunction)    a.) on PDE5i (tadalafil  + sildenafil )   Family history of adverse reaction to anesthesia    a.) stroke-like symptoms in 1st degree relative (father) following upper GI   Former smoker    History of bilateral cataract extraction    History of right foot drop    Hyperlipidemia    Hyperplastic polyp of ascending colon    Hypertension    Long-term use of aspirin therapy    Lumbar stenosis with neurogenic claudication    Lung nodule    Male hypogonadism    a.) on TRT injections (testosterone  cypionate)   Melanoma (HCC) 02/25/2021   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 male who was admitted 02/20/2024 with a diagnosis of S/P total left hip arthroplasty and went to the operating room on 02/20/2024 and underwent the above named procedures.    Surgeries: Procedure(s): ARTHROPLASTY, HIP, TOTAL, ANTERIOR APPROACH on 02/20/2024 Patient tolerated the surgery well. Taken to PACU where she was stabilized and then transferred to the orthopedic floor.  Started on Lovenox  40 mg q 24 hrs. TEDs and SCDs applied bilaterally. Heels elevated on bed. No evidence of DVT. Negative Homan. Physical therapy started on day #1 for gait training and transfer. OT started day #1 for ADL and assisted devices.  Patient's IV was d/c on day #1. Patient was able to safely and independently complete all PT goals. PT recommending discharge to home.    On post op day #1 patient was stable and ready for discharge to home with HHPT.  Implants: Cup: Trident Tritanium Clusterhole 54/E w/x 2 screws    Liner: Neutral X3 poly 36/E  Stem: Insignia #4 high offset   Head:Biolox ceramic 36 -2.51mm    He was given perioperative antibiotics:  Anti-infectives (From admission, onward)    Start     Dose/Rate Route Frequency Ordered  Stop   02/20/24 1400  ceFAZolin  (ANCEF ) IVPB 2g/100 mL premix        2 g 200 mL/hr over 30 Minutes Intravenous Every 6 hours 02/20/24 1130 02/20/24 2126   02/20/24 0600  ceFAZolin  (ANCEF ) IVPB 2g/100 mL premix        2 g 200 mL/hr over 30 Minutes Intravenous On call to O.R. 02/19/24 2220 02/20/24 0759     .  He was given sequential compression devices, early ambulation, and Lovenox  TEDs for DVT prophylaxis.  He benefited maximally from the hospital stay and there were no complications.    Recent  vital signs:  Vitals:   02/21/24 0400 02/21/24 0832  BP: (!) 140/67 135/61  Pulse: 74   Resp: 18 17  Temp: 98.3 F (36.8 C) 97.8 F (36.6 C)  SpO2: 97% 96%    Recent laboratory studies:  Lab Results  Component Value Date   HGB 13.9 02/21/2024   HGB 15.5 02/16/2024   HGB 16.2 12/27/2023   Lab Results  Component Value Date   WBC 15.0 (H) 02/21/2024   PLT 292 02/21/2024   Lab Results  Component Value Date   INR 0.91 11/29/2016   Lab Results  Component Value Date   NA 128 (L) 02/21/2024   K 4.2 02/21/2024   CL 105 02/21/2024   CO2 20 (L) 02/21/2024   BUN 16 02/21/2024   CREATININE 1.09 02/21/2024   GLUCOSE 181 (H) 02/21/2024    Discharge Medications:   Allergies as of 02/21/2024   No Known Allergies      Medication List     STOP taking these medications    aspirin 325 MG tablet       TAKE these medications    2-3CC SYRINGE 3 ML Misc 1 mg by Does not apply route every 14 (fourteen) days.   acetaminophen  500 MG tablet Commonly known as: TYLENOL  Take 2 tablets (1,000 mg total) by mouth every 8 (eight) hours.   albuterol 108 (90 Base) MCG/ACT inhaler Commonly known as: VENTOLIN HFA Inhale 2 puffs into the lungs every 6 (six) hours as needed for wheezing or shortness of breath.   b complex vitamins capsule Take 1 capsule by mouth daily.   BD Disp Needles 18G X 1-1/2" Misc Generic drug: NEEDLE (DISP) 18 G 1 mg by Does not apply route every 14 (fourteen) days.   BD Disp Needles 21G X 1-1/2" Misc Generic drug: NEEDLE (DISP) 21 G 1 mg by Does not apply route every 14 (fourteen) days.   celecoxib  200 MG capsule Commonly known as: CeleBREX  Take 1 capsule (200 mg total) by mouth 2 (two) times daily for 14 days. What changed:  medication strength how much to take when to take this reasons to take this   cyanocobalamin  500 MCG tablet Commonly known as: VITAMIN B12 Take 500 mcg by mouth daily.   DHEA 50 MG Tabs Take 50 mg by mouth daily.    docusate sodium  100 MG capsule Commonly known as: COLACE Take 1 capsule (100 mg total) by mouth 2 (two) times daily.   enoxaparin  40 MG/0.4ML injection Commonly known as: LOVENOX  Inject 0.4 mLs (40 mg total) into the skin daily for 14 days. Start taking on: February 22, 2024   fluticasone  50 MCG/ACT nasal spray Commonly known as: FLONASE  Place 1 spray into both nostrils daily as needed for allergies.   ketoconazole  2 % cream Commonly known as: NIZORAL  Apply 1 Application topically at bedtime. Qhs to feet   losartan  25 MG tablet Commonly known as: COZAAR Take 25 mg by mouth daily.   multivitamin with minerals Tabs tablet Take 1 tablet by mouth daily.   ondansetron  4 MG tablet Commonly known as: ZOFRAN  Take 1 tablet (4 mg total) by mouth every 6 (six) hours as needed for nausea.   oxyCODONE  5 MG immediate release tablet Commonly known as: Roxicodone  Take 1 tablet (5 mg total) by mouth every 8 (eight) hours as needed for breakthrough pain.   PROBIOTIC DAILY PO Take 1 capsule by mouth daily.   sildenafil  20 MG tablet Commonly known as: REVATIO  1 to 5 tablets as needed an hour prior to intercourse.   SYSTANE OP Place 1 drop into both eyes daily as needed (dry eyes).   tadalafil  5 MG tablet Commonly known as: CIALIS  Take 1 tablet (5 mg total) by mouth daily.   testosterone  cypionate 200 MG/ML injection Commonly known as: DEPOTESTOSTERONE CYPIONATE Inject 0.5 mLs (100 mg total) into the muscle once a week.   traMADol  50 MG tablet Commonly known as: ULTRAM  Take 1 tablet (50 mg total) by mouth every 6 (six) hours as needed for moderate pain (pain score 4-6).   TURMERIC CURCUMIN PO Take 1 tablet by mouth daily.   Zinc 30 MG Tabs Take 30 mg by mouth daily.        Diagnostic Studies: DG HIP UNILAT WITH PELVIS 2-3 VIEWS RIGHT Result Date: 02/20/2024 CLINICAL DATA:  Left hip arthroplasty. EXAM: DG HIP (WITH OR WITHOUT PELVIS) 2-3V RIGHT COMPARISON:  Pelvic radiograph  dated 30 18. FINDINGS: Three trauma. Fluoroscopic spot images provided. The total fluoroscopic time is 22 seconds with a cumulative air Karma of 8.2 mGy. Bilateral total hip arthroplasties noted. IMPRESSION: Fluoroscopic spot images. Electronically Signed   By: Angus Bark M.D.   On: 02/20/2024 13:33   DG C-Arm 1-60 Min-No Report Result Date: 02/20/2024 Fluoroscopy was utilized by the requesting physician.  No radiographic interpretation.   DG C-Arm 1-60 Min-No Report Result Date: 02/20/2024 Fluoroscopy was utilized by the requesting physician.  No radiographic interpretation.    Disposition:      Follow-up Information     Coralyn Derry, PA-C Follow up in 2 week(s).   Specialties: Orthopedic Surgery, Emergency Medicine Contact information: [address] [place] [address] [phone]                  Signed: Coralyn Derry 02/21/2024, 10:31 AM

## 2024-02-21 NOTE — Progress Notes (Signed)
DISCHARGE NOTE:  Pt given discharge instructions and verbalized understanding. Pt wheeled to car by staff, wife providing transportation home.

## 2024-02-21 NOTE — Progress Notes (Signed)
 Physical Therapy Treatment Patient Details Name: Donald [name] MRN: [id number] DOB: [birth year] Today's Date: 02/21/2024   History of Present Illness Patient is a 73 year old male with left hip osteoarthritis s/p left total hip arthroplasty. History of bilateral TKA, R THA, CAD.    PT Comments  Pt was pleasant and motivated to participate during the session and put forth good effort throughout. Pt required no physical assistance during the session and presented with good control and stability with transfers from various surfaces and during gait and stair training. Pt reported no adverse symptoms during the session other than very mild L hip pain.  Pt's spouse present throughout the session during training and both pt and spouse report no questions or concerns related to discharge at end of session.  Pt will benefit from continued PT services upon discharge to safely address deficits listed in patient problem list for decreased caregiver assistance and eventual return to PLOF.        If plan is discharge home, recommend the following: Assistance with cooking/housework;Assist for transportation;Help with stairs or ramp for entrance;A little help with walking and/or transfers;A little help with bathing/dressing/bathroom   Can travel by private vehicle        Equipment Recommendations  None recommended by PT    Recommendations for Other Services       Precautions / Restrictions Precautions Precautions: Anterior Hip;Fall Precaution Booklet Issued: Yes (comment) Recall of Precautions/Restrictions: Intact Restrictions Weight Bearing Restrictions Per Provider Order: Yes LLE Weight Bearing Per Provider Order: Weight bearing as tolerated     Mobility  Bed Mobility               General bed mobility comments: NT, in recliner pre-post session    Transfers Overall transfer level: Needs assistance Equipment used: Rolling walker (2 wheels) Transfers: Sit to/from Stand Sit to Stand:  Supervision           General transfer comment: Good concentric and eccentric control and stability from multiple height surfaces    Ambulation/Gait Ambulation/Gait assistance: Supervision Gait Distance (Feet): 150 Feet Assistive device: Rolling walker (2 wheels) Gait Pattern/deviations: Decreased stance time - left, Trunk flexed, Step-through pattern, Decreased step length - right, Decreased step length - left Gait velocity: decreased     General Gait Details: Min verbal cues for amb closer to the RW with upright posture and to roll the walker vs picking it up to advance it   Stairs Stairs: Yes Stairs assistance: Contact guard assist Stair Management: One rail Right, Step to pattern, Forwards Number of Stairs: 4 General stair comments: Visual training with pt and spouse followed by pt practice with good control and stability throughout   Wheelchair Mobility     Tilt Bed    Modified Rankin (Stroke Patients Only)       Balance Overall balance assessment: Needs assistance Sitting-balance support: Feet supported Sitting balance-Leahy Scale: Good     Standing balance support: Bilateral upper extremity supported, During functional activity Standing balance-Leahy Scale: Good Standing balance comment: Pt able to urinate without UE support with good stability                            Communication Communication Communication: No apparent difficulties  Cognition Arousal: Alert Behavior During Therapy: WFL for tasks assessed/performed   PT - Cognitive impairments: No apparent impairments  Following commands: Intact      Cueing Cueing Techniques: Verbal cues, Visual cues  Exercises Other Exercises Other Exercises: Car transfer sequencing education with pt and spouse    General Comments        Pertinent Vitals/Pain Pain Assessment Pain Assessment: 0-10 Pain Score: 2  Pain Location: 1-2/10 L hip Pain  Descriptors / Indicators: Discomfort Pain Intervention(s): Monitored during session, Ice applied    Home Living                          Prior Function            PT Goals (current goals can now be found in the care plan section) Progress towards PT goals: Progressing toward goals    Frequency    BID      PT Plan      Co-evaluation              AM-PAC PT "6 Clicks" Mobility   Outcome Measure  Help needed turning from your back to your side while in a flat bed without using bedrails?: None Help needed moving from lying on your back to sitting on the side of a flat bed without using bedrails?: None Help needed moving to and from a bed to a chair (including a wheelchair)?: A Little Help needed standing up from a chair using your arms (e.g., wheelchair or bedside chair)?: A Little Help needed to walk in hospital room?: A Little Help needed climbing 3-5 steps with a railing? : A Little 6 Click Score: 20    End of Session Equipment Utilized During Treatment: Gait belt Activity Tolerance: Patient tolerated treatment well Patient left: in chair;with call bell/phone within reach;with family/visitor present Nurse Communication: Mobility status PT Visit Diagnosis: Difficulty in walking, not elsewhere classified (R26.2);Other abnormalities of gait and mobility (R26.89);Pain Pain - Right/Left: Left Pain - part of body: Hip     Time: 1610-9604 PT Time Calculation (min) (ACUTE ONLY): 21 min  Charges:    $Gait Training: 8-22 mins PT General Charges $$ ACUTE PT VISIT: 1 Visit                     D. Scott Bernal Luhman PT, DPT 02/21/24, 11:03 AM

## 2024-02-21 NOTE — TOC Transition Note (Signed)
 Transition of Care [facility]) - Discharge Note   Patient Details  Name: Donald [name] MRN: [id number] Date of Birth: 1951/08/17  [facility] CM/SW Contact:  Alexandra Ice, RN Phone Number: 02/21/2024, 10:37 AM   Clinical Narrative:    Patient to discharge today, home with home health services. [facility] set up by surgeon's office prior to surgery. Patient has DME at home, none needed this hospitalization. Shaun with [facility] notified of patient's discharge. Family to transport patient at discharge.    Barriers to Discharge: Barriers Resolved   Patient Goals and CMS Choice Patient states their goals for this hospitalization and ongoing recovery are:: get better      Expected Discharge Plan and Services           Expected Discharge Date: 02/21/24                 DME Agency: NA       HH Arranged: PT, OT HH Agency: [facility] Date HH Agency Contacted: 02/21/24 Time HH Agency Contacted: 1037 Representative spoke with at [facility] Agency: Shaun  Prior Living Arrangements/Services                       Activities of Daily Living   ADL Screening (condition at time of admission) Independently performs ADLs?: Yes (appropriate for developmental age) Is the patient deaf or have difficulty hearing?: No Does the patient have difficulty seeing, even when wearing glasses/contacts?: No Does the patient have difficulty concentrating, remembering, or making decisions?: No  Permission Sought/Granted                  Emotional Assessment              Admission diagnosis:  Primary osteoarthritis of left hip [M16.12] S/P total left hip arthroplasty [W09.811] Patient Active Problem List   Diagnosis Date Noted   S/P total left hip arthroplasty 02/20/2024   S/P total hip arthroplasty 12/08/2016   Primary osteoarthritis of right hip 05/11/2016   Spondylosis of lumbar region without myelopathy or radiculopathy 05/11/2016    Status post total left knee replacement 11/17/2015   Status post total right knee replacement 11/17/2015   Bilateral leg edema 01/20/2015   Chronic low back pain 01/20/2015   Hyperglycemia, unspecified 04/04/2014   Obesity (BMI 30-39.9) 04/04/2014   PCP:  Little Riff, MD Pharmacy:   [facility] [address] [place] - [address] [place] [address] Phone: [phone] Fax: [phone]     Social Determinants of Health (SDOH) Interventions    Readmission Risk Interventions     No data to display            Final next level of care: Home w Home Health Services Barriers to Discharge: Barriers Resolved   Patient Goals and CMS Choice Patient states their goals for this hospitalization and ongoing recovery are:: get better          Discharge Placement                  Name of family member notified: Pennye Boy Patient and family notified of of transfer: 02/21/24  Discharge Plan and Services Additional resources added to the After Visit Summary for                    DME Agency: NA       HH Arranged: PT, OT HH Agency: [facility] Date HH Agency Contacted: 02/21/24 Time HH Agency Contacted: 1037 Representative spoke with at [facility] Agency: Shaun  Social Drivers of Health (SDOH) Interventions SDOH Screenings   Food Insecurity: No Food Insecurity (09/15/2023)   Received from [facility]  Housing: Low Risk  (11/28/2023)   Received from [facility]  Transportation Needs: No Transportation Needs (09/15/2023)   Received from [facility]  Utilities: Not At Risk (09/15/2023)   Received from [facility]  Tobacco Use: Medium Risk (02/20/2024)     Readmission Risk Interventions     No data to display

## 2024-02-21 NOTE — Anesthesia Postprocedure Evaluation (Signed)
 Anesthesia Post Note  Patient: Donald [name]  Procedure(s) Performed: ARTHROPLASTY, HIP, TOTAL, ANTERIOR APPROACH (Left: Hip)  Patient location during evaluation: Nursing Unit Anesthesia Type: Spinal Level of consciousness: awake Respiratory status: spontaneous breathing Cardiovascular status: stable Postop Assessment: no headache Anesthetic complications: no   No notable events documented.   Last Vitals:  Vitals:   02/20/24 2316 02/21/24 0400  BP: 121/88 (!) 140/67  Pulse: 78 74  Resp: 16 18  Temp: 36.9 C 36.8 C  SpO2: 96% 97%    Last Pain:  Vitals:   02/21/24 0538  TempSrc:   PainSc: 1                  Curvin Downing

## 2024-02-21 NOTE — Discharge Instructions (Signed)

## 2024-03-08 ENCOUNTER — Ambulatory Visit: Admitting: Dermatology

## 2024-03-14 ENCOUNTER — Ambulatory Visit: Payer: Medicare Other | Admitting: Dermatology

## 2024-03-20 ENCOUNTER — Ambulatory Visit: Payer: Self-pay | Admitting: Urology

## 2024-03-22 ENCOUNTER — Ambulatory Visit: Admitting: Urology

## 2024-03-22 ENCOUNTER — Encounter: Payer: Self-pay | Admitting: Urology

## 2024-03-22 VITALS — BP 147/69 | HR 82

## 2024-03-22 DIAGNOSIS — E291 Testicular hypofunction: Secondary | ICD-10-CM | POA: Diagnosis not present

## 2024-03-22 DIAGNOSIS — N401 Enlarged prostate with lower urinary tract symptoms: Secondary | ICD-10-CM | POA: Diagnosis not present

## 2024-03-22 DIAGNOSIS — N529 Male erectile dysfunction, unspecified: Secondary | ICD-10-CM | POA: Diagnosis not present

## 2024-03-22 DIAGNOSIS — R350 Frequency of micturition: Secondary | ICD-10-CM

## 2024-03-22 MED ORDER — TAMSULOSIN HCL 0.4 MG PO CAPS: 0.4000 mg | ORAL_CAPSULE | Freq: Every day | ORAL | 3 refills | Status: AC

## 2024-03-22 NOTE — Progress Notes (Signed)
 03/22/2024 9:32 AM   Donald [name] 1951/03/10 [id number]  Referring provider: Little Riff, MD [address] [facility] [facility] [place],  [place] [address]  Urological history: 1.  Hypogonadism -Contributing factors of age, obesity, alcohol consumption and hyperglycemia -Testosterone  level pending -Hemoglobin/hematocrit pending  -Testosterone  cypionate 200 mg/milliliters, 0.5 cc every 7 days  2. BPH with LU TS -PSA (12/2023) 1.4 -UroLift (2022)   3. Erectile dysfunction - Tadalafil  5 mg daily - Sildenafil  20 mg, 1 to 5 tablets on demand dosing as needed for intercourse  Chief Complaint  Patient presents with   Follow-up   HPI: Donald [name] is a 73 y.o. man who presents today for follow up.   Previous records reviewed.   I PSS 15/2  He has been having some issues with intermittency and postvoid dribbling.  Patient denies any modifying or aggravating factors.  Patient denies any recent UTI's, gross hematuria, dysuria or suprapubic/flank pain.  Patient denies any fevers, chills, nausea or vomiting.  He is taking tadalafil  5 mg daily.   IPSS     Row Name 03/22/24 0900         International Prostate Symptom Score   How often have you had the sensation of not emptying your bladder? About half the time     How often have you had to urinate less than every two hours? About half the time     How often have you found you stopped and started again several times when you urinated? Less than half the time     How often have you found it difficult to postpone urination? Less than half the time     How often have you had a weak urinary stream? Less than half the time     How often have you had to strain to start urination? Less than 1 in 5 times     How many times did you typically get up at night to urinate? 2 Times     Total IPSS Score 15       Quality of Life due to urinary symptoms   If you were to spend the rest of your life with your urinary condition just  the way it is now how would you feel about that? Mostly Satisfied               Score:  1-7 Mild 8-19 Moderate 20-35 Severe   SHIM 16  He is taking today for 5 mg daily and sildenafil  20 mg 3 to 5 tablets on demand dosing.  He is satisfied with his erections.  Patient still having spontaneous erections.  He denies any pain or curvature with erections.     SHIM     Row Name 03/22/24 0911         SHIM: Over the last 6 months:   How do you rate your confidence that you could get and keep an erection? Low     When you had erections with sexual stimulation, how often were your erections hard enough for penetration (entering your partner)? Sometimes (about half the time)     During sexual intercourse, how often were you able to maintain your erection after you had penetrated (entered) your partner? Sometimes (about half the time)     During sexual intercourse, how difficult was it to maintain your erection to completion of intercourse? Slightly Difficult     When you attempted sexual intercourse, how often was it satisfactory for you? Most Times (  much more than half the time)       SHIM Total Score   SHIM 16               Score: 1-7 Severe ED 8-11 Moderate ED 12-16 Mild-Moderate ED 17-21 Mild ED 22-25 No ED    PMH: Past Medical History:  Diagnosis Date   Actinic keratosis    Arthritis    Basal cell carcinoma (BCC) of LEFT lateral canthus 01/24/2017   Nodular pattern. Tx: EDC   Basal cell carcinoma (BCC) of left shoulder 02/23/2008   CAD (coronary artery disease)    COVID-19 06/2020   DDD (degenerative disc disease), lumbar    ED (erectile dysfunction)    a.) on PDE5i (tadalafil  + sildenafil )   Family history of adverse reaction to anesthesia    a.) stroke-like symptoms in 1st degree relative (father) following upper GI   Former smoker    History of bilateral cataract extraction    History of right foot drop    Hyperlipidemia    Hyperplastic polyp of  ascending colon    Hypertension    Long-term use of aspirin  therapy    Lumbar stenosis with neurogenic claudication    Lung nodule    Male hypogonadism    a.) on TRT injections (testosterone  cypionate)   Melanoma (HCC) 02/25/2021   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, L med forehead, 03/04/21 LN2, Imiquimod ;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- MOHs completed 11/25/21   PONV (postoperative nausea and vomiting)    a.) years ago following hernia surgery   Pre-diabetes    Reactive airway disease    Rectal polyp (benign)    Renal cyst, right    Serrated adenoma of colon     Surgical History: Past Surgical History:  Procedure Laterality Date   ANKLE FRACTURE SURGERY Right 1976   BACK SURGERY  1984   L4-5   CATARACT EXTRACTION W/PHACO Right 06/03/2015   Procedure: CATARACT EXTRACTION PHACO AND INTRAOCULAR LENS PLACEMENT (IOC);  Surgeon: Clair Crews, MD;  Location: [facility];  Service: Ophthalmology;  Laterality: Right;  US : 01:26.5AP%: 19.7CDE: 17.07Fluid lot# [id number]   CATARACT EXTRACTION W/PHACO Left 06/07/2023   Procedure: CATARACT EXTRACTION PHACO AND INTRAOCULAR LENS PLACEMENT (IOC) LEFT 6.32 00:40.9;  Surgeon: Clair Crews, MD;  Location: [facility];  Service: Ophthalmology;  Laterality: Left;   COLONOSCOPY N/A 08/16/2016   Procedure: COLONOSCOPY;  Surgeon: Deveron Fly, MD;  Location: [facility];  Service: Endoscopy;  Laterality: N/A;   COLONOSCOPY N/A 02/22/2022   Procedure: COLONOSCOPY;  Surgeon: Quintin Buckle, DO;  Location: [facility] END[facility]COPY;  Service: Gastroenterology;  Laterality: N/A;   CYSTOSCOPY WITH INSERTION OF UROLIFT N/A 12/11/2020   Procedure: CYSTOSCOPY WITH INSERTION OF UROLIFT;  Surgeon: Rea Cambridge, MD;  Location: [facility];  Service: Urology;  Laterality: N/A;   HERNIA REPAIR Left 1972   KNEE ARTHROPLASTY Left 2012   KNEE ARTHROPLASTY Right 2014   TOTAL HIP ARTHROPLASTY Right 12/08/2016   Procedure: TOTAL HIP  ARTHROPLASTY;  Surgeon: Arlyne Lame, MD;  Location: [facility];  Service: Orthopedics;  Laterality: Right;   TOTAL HIP ARTHROPLASTY Left 02/20/2024   Procedure: ARTHROPLASTY, HIP, TOTAL, ANTERIOR APPROACH;  Surgeon: Venus Ginsberg, MD;  Location: [facility];  Service: Orthopedics;  Laterality: Left;    Home Medications:  Allergies as of 03/22/2024   No Known Allergies      Medication List        Accurate as of Mar 22, 2024  9:32 AM.  If you have any questions, ask your nurse or doctor.          STOP taking these medications    docusate sodium  100 MG capsule Commonly known as: COLACE   enoxaparin  40 MG/0.4ML injection Commonly known as: LOVENOX    losartan  25 MG tablet Commonly known as: COZAAR    ondansetron  4 MG tablet Commonly known as: ZOFRAN        TAKE these medications    2-3CC SYRINGE 3 ML Misc 1 mg by Does not apply route every 14 (fourteen) days.   acetaminophen  500 MG tablet Commonly known as: TYLENOL  Take 2 tablets (1,000 mg total) by mouth every 8 (eight) hours.   albuterol  108 (90 Base) MCG/ACT inhaler Commonly known as: VENTOLIN  HFA Inhale 2 puffs into the lungs every 6 (six) hours as needed for wheezing or shortness of breath.   b complex vitamins capsule Take 1 capsule by mouth daily.   BD Disp Needles 18G X 1-1/2" Misc Generic drug: NEEDLE (DISP) 18 G 1 mg by Does not apply route every 14 (fourteen) days.   BD Disp Needles 21G X 1-1/2" Misc Generic drug: NEEDLE (DISP) 21 G 1 mg by Does not apply route every 14 (fourteen) days.   celecoxib  200 MG capsule Commonly known as: CELEBREX  Take 1 capsule by mouth 2 (two) times daily.   cyanocobalamin  500 MCG tablet Commonly known as: VITAMIN B12 Take 500 mcg by mouth daily.   DHEA 50 MG Tabs Take 50 mg by mouth daily.   fluticasone  50 MCG/ACT nasal spray Commonly known as: FLONASE  Place 1 spray into both nostrils daily as needed for allergies.   ketoconazole  2 % cream Commonly known as:  NIZORAL  Apply 1 Application topically at bedtime. Qhs to feet   multivitamin with minerals Tabs tablet Take 1 tablet by mouth daily.   oxyCODONE  5 MG immediate release tablet Commonly known as: Roxicodone  Take 1 tablet (5 mg total) by mouth every 8 (eight) hours as needed for breakthrough pain.   PROBIOTIC DAILY PO Take 1 capsule by mouth daily.   sildenafil  20 MG tablet Commonly known as: REVATIO  1 to 5 tablets as needed an hour prior to intercourse.   SYSTANE OP Place 1 drop into both eyes daily as needed (dry eyes).   tadalafil  5 MG tablet Commonly known as: CIALIS  Take 1 tablet (5 mg total) by mouth daily.   tamsulosin 0.4 MG Caps capsule Commonly known as: FLOMAX Take 1 capsule (0.4 mg total) by mouth daily.   testosterone  cypionate 200 MG/ML injection Commonly known as: DEPOTESTOSTERONE CYPIONATE Inject 0.5 mLs (100 mg total) into the muscle once a week.   traMADol  50 MG tablet Commonly known as: ULTRAM  Take 1 tablet (50 mg total) by mouth every 6 (six) hours as needed for moderate pain (pain score 4-6).   TURMERIC CURCUMIN PO Take 1 tablet by mouth daily.   Zinc 30 MG Tabs Take 30 mg by mouth daily.        Allergies: No Known Allergies  Family History: No family history on file.  Social History:  reports that he has quit smoking. His smoking use included cigarettes. He quit smokeless tobacco use about 1 years ago.  His smokeless tobacco use included chew. He reports current alcohol use of about 3.0 - 5.0 standard drinks of alcohol per week. He reports that he does not use drugs.  ROS: Pertinent ROS in HPI  Physical Exam: BP (!) 147/69   Pulse 82   Constitutional:  Well  nourished. Alert and oriented, No acute distress. HEENT: [place] AT, moist mucus membranes.  Trachea midline Cardiovascular: No clubbing, cyanosis, or edema. Respiratory: Normal respiratory effort, no increased work of breathing. Neurologic: Grossly intact, no focal deficits, moving all  4 extremities. Psychiatric: Normal mood and affect.   Laboratory Data: BMET    Component Value Date/Time   NA 128 (L) 02/21/2024 0625   NA 141 10/03/2013 0605   K 4.2 02/21/2024 0625   K 4.2 10/03/2013 0605   CL 105 02/21/2024 0625   CL 110 (H) 10/03/2013 0605   CO2 20 (L) 02/21/2024 0625   CO2 25 10/03/2013 0605   GLUCOSE 181 (H) 02/21/2024 0625   GLUCOSE 122 (H) 10/03/2013 0605   BUN 16 02/21/2024 0625   BUN 8 10/03/2013 0605   CREATININE 1.09 02/21/2024 0625   CREATININE 0.87 10/03/2013 0605   CALCIUM 7.8 (L) 02/21/2024 0625   CALCIUM 8.2 (L) 10/03/2013 0605   GFRNONAA >60 02/21/2024 0625   GFRNONAA >60 10/03/2013 0605    CBC    Component Value Date/Time   WBC 15.0 (H) 02/21/2024 0625   RBC 4.92 02/21/2024 0625   HGB 13.9 02/21/2024 0625   HGB 16.2 12/27/2023 1107   HCT 41.3 02/21/2024 0625   HCT 49.3 12/27/2023 1107   PLT 292 02/21/2024 0625   PLT 180 10/03/2013 0605   MCV 83.9 02/21/2024 0625   MCV 89 09/12/2013 0912   MCH 28.3 02/21/2024 0625   MCHC 33.7 02/21/2024 0625   RDW 14.8 02/21/2024 0625   RDW 13.9 09/12/2013 0912   LYMPHSABS 1.1 02/16/2024 0906   MONOABS 0.6 02/16/2024 0906   EOSABS 0.2 02/16/2024 0906   BASOSABS 0.0 02/16/2024 0906    Urinalysis Component     Latest Ref Rng 02/16/2024  Specific Gravity, UA     1.005 - 1.030    pH, UA     5.0 - 7.5    Color, UA     Yellow    Appearance Ur     Clear    Leukocytes,UA     Negative    Protein,UA     Negative/Trace    Glucose, UA     NEGATIVE mg/dL NEGATIVE   Ketones, UA     Negative    RBC, UA     Negative    Bilirubin, UA     Negative    Urobilinogen, Ur     0.2 - 1.0 mg/dL   Nitrite, UA     Negative    Microscopic Examination   Color, Urine     YELLOW  YELLOW !   Appearance     CLEAR  CLEAR !   Specific Gravity, Urine     1.005 - 1.030  1.020   pH     5.0 - 8.0  5.0   Hgb urine dipstick     NEGATIVE  NEGATIVE   Bilirubin Urine     NEGATIVE  NEGATIVE   Ketones, ur      NEGATIVE mg/dL NEGATIVE   Protein     NEGATIVE mg/dL NEGATIVE   Nitrite     NEGATIVE  NEGATIVE   Leukocytes,Ua     NEGATIVE  NEGATIVE     Legend: ! Abnormal I have reviewed the labs.   Pertinent Imaging: N/A  Assessment & Plan:    1. Hypogonadism -testosterone  levels are pending -H & H normal -continue testosterone  cypionate 200 mg/milliliters, 0.5 cc  cc every 7 days until testosterone  results are available and  will adjust if necessary  2. BPH with LUTS -PSA stable -symptoms - post void dribbling and intermittency -continue conservative management, avoiding bladder irritants and timed voiding's - Will start tamsulosin  0.4 mg daily, he will notify me via MyChart in 30 days whether or not the medication is effective  3. Erectile dysfunction:    -Continue tadalafil  5 mg daily and sildenafil  20 mg on demand dosing    Return in about 6 months (around 09/22/2024) for PSA, testosterone  (one week after injection) H & H, I PSS, SHIM .  These notes generated with voice recognition software. I apologize for typographical errors.  Briant Camper  [facility] [address]  [address] [place], [place] [address] [phone]

## 2024-03-23 ENCOUNTER — Ambulatory Visit: Payer: Self-pay | Admitting: Urology

## 2024-03-23 LAB — HEMOGLOBIN AND HEMATOCRIT, BLOOD
Hematocrit: 46.7 % (ref 37.5–51.0)
Hemoglobin: 14.9 g/dL (ref 13.0–17.7)

## 2024-03-23 LAB — TESTOSTERONE: Testosterone: 723 ng/dL (ref 264–916)

## 2024-04-29 ENCOUNTER — Other Ambulatory Visit: Payer: Self-pay | Admitting: Urology

## 2024-04-29 DIAGNOSIS — E291 Testicular hypofunction: Secondary | ICD-10-CM

## 2024-05-07 ENCOUNTER — Encounter: Payer: Self-pay | Admitting: Dermatology

## 2024-05-07 ENCOUNTER — Ambulatory Visit: Admitting: Dermatology

## 2024-05-07 DIAGNOSIS — Z86006 Personal history of melanoma in-situ: Secondary | ICD-10-CM

## 2024-05-07 DIAGNOSIS — L814 Other melanin hyperpigmentation: Secondary | ICD-10-CM | POA: Diagnosis not present

## 2024-05-07 DIAGNOSIS — Z85828 Personal history of other malignant neoplasm of skin: Secondary | ICD-10-CM

## 2024-05-07 DIAGNOSIS — W908XXA Exposure to other nonionizing radiation, initial encounter: Secondary | ICD-10-CM

## 2024-05-07 DIAGNOSIS — L578 Other skin changes due to chronic exposure to nonionizing radiation: Secondary | ICD-10-CM | POA: Diagnosis not present

## 2024-05-07 DIAGNOSIS — Z1283 Encounter for screening for malignant neoplasm of skin: Secondary | ICD-10-CM | POA: Diagnosis not present

## 2024-05-07 DIAGNOSIS — L570 Actinic keratosis: Secondary | ICD-10-CM | POA: Diagnosis not present

## 2024-05-07 DIAGNOSIS — L820 Inflamed seborrheic keratosis: Secondary | ICD-10-CM | POA: Diagnosis not present

## 2024-05-07 DIAGNOSIS — Z85820 Personal history of malignant melanoma of skin: Secondary | ICD-10-CM

## 2024-05-07 DIAGNOSIS — Z872 Personal history of diseases of the skin and subcutaneous tissue: Secondary | ICD-10-CM

## 2024-05-07 DIAGNOSIS — D229 Melanocytic nevi, unspecified: Secondary | ICD-10-CM

## 2024-05-07 DIAGNOSIS — L821 Other seborrheic keratosis: Secondary | ICD-10-CM

## 2024-05-07 DIAGNOSIS — D1801 Hemangioma of skin and subcutaneous tissue: Secondary | ICD-10-CM

## 2024-05-07 NOTE — Patient Instructions (Addendum)

## 2024-05-07 NOTE — Progress Notes (Signed)
 Follow-Up Visit   Subjective  Donald [name] is a 73 y.o. male who presents for the following: 6 months Skin Cancer Screening and Full Body Skin Exam, hx of Melanoma in situ, hx of BCC, hx of Aks   The patient presents for Total-Body Skin Exam (TBSE) for skin cancer screening and mole check. The patient has spots, moles and lesions to be evaluated, some may be new or changing and the patient may have concern these could be cancer.  The following portions of the chart were reviewed this encounter and updated as appropriate: medications, allergies, medical history  Review of Systems:  No other skin or systemic complaints except as noted in HPI or Assessment and Plan.  Objective  Well appearing patient in no apparent distress; mood and affect are within normal limits.  A full examination was performed including scalp, head, eyes, ears, nose, lips, neck, chest, axillae, abdomen, back, buttocks, bilateral upper extremities, bilateral lower extremities, hands, feet, fingers, toes, fingernails, and toenails. All findings within normal limits unless otherwise noted below.   Relevant physical exam findings are noted in the Assessment and Plan.  left preauricular x 1 Stuck-on, waxy, tan-brown papule or plaque --Discussed benign etiology and prognosis.  left ear x 1 Erythematous thin papules/macules with gritty scale.   Assessment & Plan   SKIN CANCER SCREENING PERFORMED TODAY.  LENTIGINES, SEBORRHEIC KERATOSES, HEMANGIOMAS - Benign normal skin lesions - Benign-appearing - Call for any changes  MELANOCYTIC NEVI - Tan-brown and/or pink-flesh-colored symmetric macules and papules - Benign appearing on exam today - Observation - Call clinic for new or changing moles - Recommend daily use of broad spectrum spf 30+ sunscreen to sun-exposed areas.   HISTORY OF MELANOMA IN SITU. 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  - Call if any new or changing lesions are noted between office visits    HISTORY OF BASAL CELL CARCINOMA OF THE SKIN - No evidence of recurrence today - Recommend regular full body skin exams - Recommend daily broad spectrum sunscreen SPF 30+ to sun-exposed areas, reapply every 2 hours as needed.  - Call if any new or changing lesions are noted between office visits   INFLAMED SEBORRHEIC KERATOSIS left preauricular x 1 Symptomatic, irritating, patient would like treated.  Destruction of lesion - left preauricular x 1 Complexity: simple   Destruction method: cryotherapy   Informed consent: discussed and consent obtained   Timeout:  patient name, date of birth, surgical site, and procedure verified Lesion destroyed using liquid nitrogen: Yes   Region frozen until ice ball extended beyond lesion: Yes   Outcome: patient tolerated procedure well with no complications   Post-procedure details: wound care instructions given    AK (ACTINIC KERATOSIS) left ear x 1 ACTINIC DAMAGE - chronic, secondary to cumulative UV radiation exposure/sun exposure over time - diffuse scaly erythematous macules with underlying dyspigmentation - Recommend daily broad spectrum sunscreen SPF 30+ to sun-exposed areas, reapply every 2 hours as needed.  - Recommend staying in the shade or wearing long sleeves, sun glasses (UVA+UVB protection) and wide brim hats (4-inch brim around the entire circumference of the hat). - Call for new or changing lesions.  Destruction of lesion - left  ear x 1 Complexity: simple   Destruction method: cryotherapy   Informed consent: discussed and consent obtained   Timeout:  patient name, date of birth, surgical site, and procedure  verified Lesion destroyed using liquid nitrogen: Yes   Region frozen until ice ball extended beyond lesion: Yes   Outcome: patient tolerated procedure well with no complications   Post-procedure details: wound care instructions given     Return in about 6 months (around 11/07/2024) for TBSE, hx of Melanoma in situ, hx of BCC.  IFay Kirks, CMA, am acting as scribe for Alm Rhyme, MD .   Documentation: I have reviewed the above documentation for accuracy and completeness, and I agree with the above.  Alm Rhyme, MD

## 2024-06-07 ENCOUNTER — Other Ambulatory Visit: Payer: Self-pay | Admitting: Urology

## 2024-06-07 DIAGNOSIS — E291 Testicular hypofunction: Secondary | ICD-10-CM

## 2024-07-10 ENCOUNTER — Other Ambulatory Visit: Payer: Self-pay | Admitting: Urology

## 2024-07-10 DIAGNOSIS — E291 Testicular hypofunction: Secondary | ICD-10-CM

## 2024-07-13 ENCOUNTER — Other Ambulatory Visit: Payer: Self-pay | Admitting: Urology

## 2024-07-13 DIAGNOSIS — E291 Testicular hypofunction: Secondary | ICD-10-CM

## 2024-08-10 ENCOUNTER — Other Ambulatory Visit: Payer: Self-pay | Admitting: Urology

## 2024-08-10 DIAGNOSIS — E291 Testicular hypofunction: Secondary | ICD-10-CM

## 2024-08-13 NOTE — Telephone Encounter (Signed)
 RX has been previously filled for only 30 days previously. Long term RX sent as pt has been seen in the last year.

## 2024-09-10 ENCOUNTER — Other Ambulatory Visit: Payer: Self-pay | Admitting: Urology

## 2024-09-10 DIAGNOSIS — E291 Testicular hypofunction: Secondary | ICD-10-CM

## 2024-09-25 NOTE — Progress Notes (Unsigned)
 09/27/2024 9:09 AM   Donald [name] 1950/12/30 [id number]  Referring provider: Rudolpho Norleen BIRCH, MD [address] [facility] [place],  [place] [address]  Urological history: 1.  Hypogonadism -Contributing factors of age, obesity, alcohol consumption and hyperglycemia -Testosterone  level pending -Hemoglobin/hematocrit (08/2024) 14.5/46.7 -Testosterone  cypionate 200 mg/milliliters, 0.5 cc every 7 days  2. BPH with LU TS -PSA (08/2024) pending  -UroLift (2022)   3. Erectile dysfunction - Tadalafil  5 mg daily - Sildenafil  20 mg, 1 to 5 tablets on demand dosing as needed for intercourse  Chief Complaint  Patient presents with   Hypogonadism in male   HPI: Donald [name] is a 73 y.o. man who presents today for follow up.   Previous records reviewed.   He reports good adherence to testosterone  cypionate 200 mg/mL, 0. 5 cc every 7 days.  Denies new complaints of low libido, erectile dysfunction, fatigue, or mood changes.  No complaints of gynecomastia, visual changes, or thromboembolic symptoms.  Energy level, libido and overall sense of wellbeing being reported as stable/ improved compared to prior visit.    Testosterone  level pending   Hemoglobin/hematocrit normal   Liver enzymes normal  I PSS 9/2  He reports sensation of incomplete bladder emptying, urinary frequency, urinary intermittency, urinary urgency, a weak urinary stream, and nocturia x 1.  He has leaking before being able to reach the restroom if he has held his urine for too long.  Patient denies any modifying or aggravating factors.  Patient denies any recent UTI's, gross hematuria, dysuria or suprapubic/flank pain.  Patient denies any fevers, chills, nausea or vomiting.    PSA pending   Serum creatinine 1.0  Hemoglobin A1c 6.2 %  BPH meds: Tadalafil  5 mg daily  SHIM 18   He does not have confidence that he could get and keep an erection, he has difficulty maintaining his erections, and he  is not finding intercourse satisfactory for him.  Patient still having spontaneous erections.  He denies any pain or curvature with erections.  He has no issues with ejaculation.  Testosterone  level pending   Cholesterol normal   Hemoglobin A1c 6.2%  TSH 2.22   PMH: Past Medical History:  Diagnosis Date   Actinic keratosis    Arthritis    Basal cell carcinoma (BCC) of LEFT lateral canthus 01/24/2017   Nodular pattern. Tx: EDC   Basal cell carcinoma (BCC) of left shoulder 02/23/2008   CAD (coronary artery disease)    COVID-19 06/2020   DDD (degenerative disc disease), lumbar    ED (erectile dysfunction)    a.) on PDE5i (tadalafil  + sildenafil )   Family history of adverse reaction to anesthesia    a.) stroke-like symptoms in 1st degree relative (father) following upper GI   Former smoker    History of bilateral cataract extraction    History of right foot drop    Hyperlipidemia    Hyperplastic polyp of ascending colon    Hypertension    Long-term use of aspirin  therapy    Lumbar stenosis with neurogenic claudication    Lung nodule    Male hypogonadism    a.) on TRT injections (testosterone  cypionate)   Melanoma (HCC) 02/25/2021   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, L med forehead, 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, Imiquimod ;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- MOHs completed 11/25/21   PONV (postoperative nausea and vomiting)    a.) years ago following hernia surgery   Pre-diabetes    Reactive airway disease  Rectal polyp (benign)    Renal cyst, right    Serrated adenoma of colon     Surgical History: Past Surgical History:  Procedure Laterality Date   ANKLE FRACTURE SURGERY Right 1976   BACK SURGERY  1984   L4-5   CATARACT EXTRACTION W/PHACO Right 06/03/2015   Procedure: CATARACT EXTRACTION PHACO AND INTRAOCULAR LENS PLACEMENT (IOC);  Surgeon: Elsie Carmine, MD;  Location: [facility];  Service: Ophthalmology;  Laterality: Right;  US : 01:26.5AP%: 19.7CDE: 17.07Fluid  lot# [id number]   CATARACT EXTRACTION W/PHACO Left 06/07/2023   Procedure: CATARACT EXTRACTION PHACO AND INTRAOCULAR LENS PLACEMENT (IOC) LEFT 6.32 00:40.9;  Surgeon: Carmine Elsie, MD;  Location: [facility];  Service: Ophthalmology;  Laterality: Left;   COLONOSCOPY N/A 08/16/2016   Procedure: COLONOSCOPY;  Surgeon: Gladis RAYMOND Mariner, MD;  Location: [facility];  Service: Endoscopy;  Laterality: N/A;   COLONOSCOPY N/A 02/22/2022   Procedure: COLONOSCOPY;  Surgeon: Onita Elspeth Sharper, DO;  Location: [facility] ENDOSCOPY;  Service: Gastroenterology;  Laterality: N/A;   CYSTOSCOPY WITH INSERTION OF UROLIFT N/A 12/11/2020   Procedure: CYSTOSCOPY WITH INSERTION OF UROLIFT;  Surgeon: Kassie Sharper SAUNDERS, MD;  Location: [facility];  Service: Urology;  Laterality: N/A;   HERNIA REPAIR Left 1972   KNEE ARTHROPLASTY Left 2012   KNEE ARTHROPLASTY Right 2014   TOTAL HIP ARTHROPLASTY Right 12/08/2016   Procedure: TOTAL HIP ARTHROPLASTY;  Surgeon: Donald SHAUNNA Hue, MD;  Location: [facility];  Service: Orthopedics;  Laterality: Right;   TOTAL HIP ARTHROPLASTY Left 02/20/2024   Procedure: ARTHROPLASTY, HIP, TOTAL, ANTERIOR APPROACH;  Surgeon: Lorelle Hussar, MD;  Location: [facility];  Service: Orthopedics;  Laterality: Left;    Home Medications:  Allergies as of 09/27/2024       Reactions   Tamsulosin  Other (See Comments)   Fluid retention         Medication List        Accurate as of September 27, 2024  9:09 AM. If you have any questions, ask your nurse or doctor.          2-3CC SYRINGE 3 ML Misc 1 mg by Does not apply route every 14 (fourteen) days.   acetaminophen  500 MG tablet Commonly known as: TYLENOL  Take 2 tablets (1,000 mg total) by mouth every 8 (eight) hours.   albuterol  108 (90 Base) MCG/ACT inhaler Commonly known as: VENTOLIN  HFA Inhale 2 puffs into the lungs every 6 (six) hours as needed for wheezing or shortness of breath.   b complex vitamins capsule Take 1 capsule  by mouth daily.   B-D 3CC LUER-LOK SYR 18GX1-1/2 18G X 1-1/2 3 ML Misc Generic drug: SYRINGE-NEEDLE (DISP) 3 ML Inject 0.5 mg into the muscle every 7 (seven) days. What changed: See the new instructions. Changed by: CLOTILDA CORNWALL   BD Disp Needles 18G X 1-1/2 Misc Generic drug: NEEDLE (DISP) 18 G 0.5 mg by Does not apply route every 7 (seven) days. What changed:  how much to take when to take this Changed by: Sabrea Sankey   BD Disp Needles 21G X 1-1/2 Misc Generic drug: NEEDLE (DISP) 21 G Inject 0.5 mg into the muscle every 7 (seven) days. What changed: See the new instructions. Changed by: CLOTILDA CORNWALL   celecoxib  200 MG capsule Commonly known as: CELEBREX  Take 1 capsule by mouth 2 (two) times daily.   cyanocobalamin  500 MCG tablet Commonly known as: VITAMIN B12 Take 500 mcg by mouth daily.   DHEA 50 MG Tabs Take 50 mg by mouth daily.  fluticasone  50 MCG/ACT nasal spray Commonly known as: FLONASE  Place 1 spray into both nostrils daily as needed for allergies.   ketoconazole  2 % cream Commonly known as: NIZORAL  Apply 1 Application topically at bedtime. Qhs to feet   multivitamin with minerals Tabs tablet Take 1 tablet by mouth daily.   oxyCODONE  5 MG immediate release tablet Commonly known as: Roxicodone  Take 1 tablet (5 mg total) by mouth every 8 (eight) hours as needed for breakthrough pain.   PROBIOTIC DAILY PO Take 1 capsule by mouth daily.   sildenafil  20 MG tablet Commonly known as: REVATIO  TAKE 1 TO 5 TABLETS BY MOUTH AS NEEDED. TAKE 1 HOUR BEFORE INTERCOURSE   SYSTANE OP Place 1 drop into both eyes daily as needed (dry eyes).   tadalafil  5 MG tablet Commonly known as: CIALIS  Take 1 tablet (5 mg total) by mouth daily.   tamsulosin  0.4 MG Caps capsule Commonly known as: FLOMAX  Take 1 capsule (0.4 mg total) by mouth daily.   testosterone  cypionate 200 MG/ML injection Commonly known as: DEPOTESTOSTERONE CYPIONATE Inject 0.5 mLs (100  mg total) into the muscle every 7 (seven) days. What changed: See the new instructions. Changed by: CLOTILDA CORNWALL   traMADol  50 MG tablet Commonly known as: ULTRAM  Take 1 tablet (50 mg total) by mouth every 6 (six) hours as needed for moderate pain (pain score 4-6).   TURMERIC CURCUMIN PO Take 1 tablet by mouth daily.   Zinc 30 MG Tabs Take 30 mg by mouth daily.        Allergies:  Allergies  Allergen Reactions   Tamsulosin  Other (See Comments)    Fluid retention     Family History: No family history on file.  Social History:  reports that he has quit smoking. His smoking use included cigarettes. He quit smokeless tobacco use about 2 years ago.  His smokeless tobacco use included chew. He reports current alcohol use of about 3.0 - 5.0 standard drinks of alcohol per week. He reports that he does not use drugs.  ROS: Pertinent ROS in HPI  Physical Exam: BP (!) 145/67 (BP Location: Left Arm, Patient Position: Sitting, Cuff Size: Large)   Pulse 79   Wt 250 lb (113.4 kg)   SpO2 96%   BMI 33.91 kg/m   Constitutional:  Well nourished. Alert and oriented, No acute distress. HEENT: [place] AT, moist mucus membranes.  Trachea midline Cardiovascular: No clubbing, cyanosis, or edema. Respiratory: Normal respiratory effort, no increased work of breathing. Neurologic: Grossly intact, no focal deficits, moving all 4 extremities. Psychiatric: Normal mood and affect.    Laboratory Data: See Epic and HPI    I have reviewed the labs.   Pertinent Imaging: N/A  Assessment & Plan:    1. Hypogonadism -testosterone  levels are pending -H & H normal -continue testosterone  cypionate 200 mg/milliliters, 0.5 cc  cc every 7 days until testosterone  results are available and will adjust if necessary  2. BPH with LUTS -PSA pending  -symptoms - post void dribbling and intermittency -continue conservative management, avoiding bladder irritants and timed voiding's - Continue tadalafil  5 mg  daily  3. Erectile dysfunction:    -Continue tadalafil  5 mg daily and sildenafil  20 mg on demand dosing    Return in about 6 months (around 03/28/2025) for PSA, testosterone , H & H only .  These notes generated with voice recognition software. I apologize for typographical errors.  CLOTILDA CORNWALL RIGGERS  [facility] [address]  [address]  [place], [place] [address] [phone]

## 2024-09-27 ENCOUNTER — Encounter: Payer: Self-pay | Admitting: Urology

## 2024-09-27 ENCOUNTER — Ambulatory Visit: Admitting: Urology

## 2024-09-27 VITALS — BP 145/67 | HR 79 | Wt 250.0 lb

## 2024-09-27 DIAGNOSIS — N401 Enlarged prostate with lower urinary tract symptoms: Secondary | ICD-10-CM

## 2024-09-27 DIAGNOSIS — R350 Frequency of micturition: Secondary | ICD-10-CM

## 2024-09-27 DIAGNOSIS — N529 Male erectile dysfunction, unspecified: Secondary | ICD-10-CM | POA: Diagnosis not present

## 2024-09-27 DIAGNOSIS — E291 Testicular hypofunction: Secondary | ICD-10-CM

## 2024-09-27 MED ORDER — BD LUER-LOK SYRINGE 18G X 1-1/2" 3 ML MISC: 0.5000 mg | 0 refills | Status: DC

## 2024-09-27 MED ORDER — BD DISP NEEDLES 21G X 1-1/2" MISC: 0.5000 mg | 0 refills | Status: AC

## 2024-09-27 MED ORDER — BD DISP NEEDLES 18G X 1-1/2" MISC: 0.5000 mg | 0 refills | Status: AC

## 2024-09-27 MED ORDER — SILDENAFIL CITRATE 20 MG PO TABS: ORAL_TABLET | ORAL | 11 refills | Status: AC

## 2024-09-27 MED ORDER — TESTOSTERONE CYPIONATE 200 MG/ML IM SOLN: 100.0000 mg | INTRAMUSCULAR | 0 refills | Status: AC

## 2024-09-27 MED ORDER — TADALAFIL 5 MG PO TABS: 5.0000 mg | ORAL_TABLET | Freq: Every day | ORAL | 11 refills | Status: AC

## 2024-09-28 LAB — TESTOSTERONE: Testosterone: 584 ng/dL (ref 264–916)

## 2024-09-28 LAB — PSA: Prostate Specific Ag, Serum: 1.9 ng/mL (ref 0.0–4.0)

## 2024-09-30 ENCOUNTER — Ambulatory Visit: Payer: Self-pay | Admitting: Urology

## 2024-10-08 ENCOUNTER — Other Ambulatory Visit: Payer: Self-pay

## 2024-10-08 DIAGNOSIS — E291 Testicular hypofunction: Secondary | ICD-10-CM

## 2024-10-08 MED ORDER — BD LUER-LOK SYRINGE 18G X 1-1/2" 3 ML MISC: 0.5000 mg | 0 refills | Status: AC

## 2024-11-07 ENCOUNTER — Ambulatory Visit: Admitting: Dermatology

## 2024-11-07 ENCOUNTER — Encounter: Payer: Self-pay | Admitting: Dermatology

## 2024-11-07 DIAGNOSIS — L578 Other skin changes due to chronic exposure to nonionizing radiation: Secondary | ICD-10-CM | POA: Diagnosis not present

## 2024-11-07 DIAGNOSIS — L814 Other melanin hyperpigmentation: Secondary | ICD-10-CM | POA: Diagnosis not present

## 2024-11-07 DIAGNOSIS — Z1283 Encounter for screening for malignant neoplasm of skin: Secondary | ICD-10-CM | POA: Diagnosis not present

## 2024-11-07 DIAGNOSIS — L820 Inflamed seborrheic keratosis: Secondary | ICD-10-CM

## 2024-11-07 DIAGNOSIS — L821 Other seborrheic keratosis: Secondary | ICD-10-CM

## 2024-11-07 DIAGNOSIS — W908XXA Exposure to other nonionizing radiation, initial encounter: Secondary | ICD-10-CM

## 2024-11-07 DIAGNOSIS — B079 Viral wart, unspecified: Secondary | ICD-10-CM

## 2024-11-07 DIAGNOSIS — Z86006 Personal history of melanoma in-situ: Secondary | ICD-10-CM

## 2024-11-07 DIAGNOSIS — Z7189 Other specified counseling: Secondary | ICD-10-CM

## 2024-11-07 DIAGNOSIS — D229 Melanocytic nevi, unspecified: Secondary | ICD-10-CM

## 2024-11-07 DIAGNOSIS — D1801 Hemangioma of skin and subcutaneous tissue: Secondary | ICD-10-CM | POA: Diagnosis not present

## 2024-11-07 DIAGNOSIS — Z85828 Personal history of other malignant neoplasm of skin: Secondary | ICD-10-CM

## 2024-11-07 DIAGNOSIS — Z85820 Personal history of malignant melanoma of skin: Secondary | ICD-10-CM

## 2024-11-07 NOTE — Patient Instructions (Signed)

## 2024-11-07 NOTE — Progress Notes (Signed)
 "  Follow-Up Visit   Subjective  Donald [name] is a 74 y.o. male who presents for the following: Skin Cancer Screening and Full Body Skin Exam  The patient presents for Total-Body Skin Exam (TBSE) for skin cancer screening and mole check. The patient has spots, moles and lesions to be evaluated, some may be new or changing and the patient may have concern these could be cancer.  The following portions of the chart were reviewed this encounter and updated as appropriate: medications, allergies, medical history  Review of Systems:  No other skin or systemic complaints except as noted in HPI or Assessment and Plan.  Objective  Well appearing patient in no apparent distress; mood and affect are within normal limits.  A full examination was performed including scalp, head, eyes, ears, nose, lips, neck, chest, axillae, abdomen, back, buttocks, bilateral upper extremities, bilateral lower extremities, hands, feet, fingers, toes, fingernails, and toenails. All findings within normal limits unless otherwise noted below.   Relevant physical exam findings are noted in the Assessment and Plan.  chest x 3, back x 1 (2) Stuck-on, waxy, tan-brown papule or plaque --Discussed benign etiology and prognosis.  R thumb x 1 Verrucous papules -- Discussed viral etiology and contagion.   Assessment & Plan   SKIN CANCER SCREENING PERFORMED TODAY.  ACTINIC DAMAGE - Chronic condition, secondary to cumulative UV/sun exposure - diffuse scaly erythematous macules with underlying dyspigmentation - Recommend daily broad spectrum sunscreen SPF 30+ to sun-exposed areas, reapply every 2 hours as needed.  - Staying in the shade or wearing long sleeves, sun glasses (UVA+UVB protection) and wide brim hats (4-inch brim around the entire circumference of the hat) are also recommended for sun protection.  - Call for new or changing lesions.  LENTIGINES, SEBORRHEIC KERATOSES, HEMANGIOMAS - Benign normal skin lesions -  Benign-appearing - Call for any changes  MELANOCYTIC NEVI - Tan-brown and/or pink-flesh-colored symmetric macules and papules - Benign appearing on exam today - Observation - Call clinic for new or changing moles - Recommend daily use of broad spectrum spf 30+ sunscreen to sun-exposed areas.   HISTORY OF MELANOMA IN SITU. 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  - Call if any new or changing lesions are noted between office visits    HISTORY OF BASAL CELL CARCINOMA OF THE SKIN - No evidence of recurrence today - Recommend regular full body skin exams - Recommend daily broad spectrum sunscreen SPF 30+ to sun-exposed areas, reapply every 2 hours as needed.  - Call if any new or changing lesions are noted between office visits  INFLAMED SEBORRHEIC KERATOSIS (2) chest x 3, back x 1 (2) Symptomatic, irritating, patient would like treated.  - Destruction of lesion - chest x 3, back x 1 (2) Complexity: simple   Destruction method: cryotherapy   Informed consent: discussed and consent obtained   Timeout:  patient name, date of birth, surgical site, and procedure verified Lesion destroyed using liquid nitrogen: Yes   Region frozen until ice ball extended beyond lesion: Yes   Outcome: patient tolerated procedure well with no complications   Post-procedure details: wound care instructions given    VIRAL WARTS, UNSPECIFIED TYPE R thumb x 1 Viral Wart (  HPV) Counseling  Discussed viral / HPV (Human Papilloma Virus) etiology and risk of spread /infectivity to other areas of body as well as to other people.  Multiple treatments and methods may be required to clear warts and it is  possible treatment may not be successful.  Treatment risks include discoloration; scarring and there is still potential for wart recurrence. - Destruction of lesion - R thumb x 1 Complexity: simple   Destruction method: cryotherapy   Informed consent: discussed and consent obtained   Timeout:  patient name, date of birth, surgical site, and procedure verified Lesion destroyed using liquid nitrogen: Yes   Region frozen until ice ball extended beyond lesion: Yes   Outcome: patient tolerated procedure well with no complications   Post-procedure details: wound care instructions given   Additional details:  Patient advised to set alarm to remind them to wash off with soap and water at the directed time.  - Destruction of lesion - R thumb x 1  Destruction method: chemical removal   Informed consent: discussed and consent obtained   Timeout:  patient name, date of birth, surgical site, and procedure verified Chemical destruction method: cantharidin   Chemical destruction method comment:  Squaric acid 3%, cantharidin plus Procedure instructions: patient instructed to wash and dry area   Outcome: patient tolerated procedure well with no complications   Post-procedure details: wound care instructions given   Additional details:  Patient advised to set alarm to remind them to wash off with soap and water at the directed time.   Return in about 6 months (around 05/07/2025) for TBSE - hx BCC, MMIS.  LILLETTE Rosina Mayans, CMA, am acting as scribe for Alm Rhyme, MD .   Documentation: I have reviewed the above documentation for accuracy and completeness, and I agree with the above.  Alm Rhyme, MD    "

## 2025-04-03 ENCOUNTER — Other Ambulatory Visit

## 2025-05-08 ENCOUNTER — Ambulatory Visit: Admitting: Dermatology
